# Patient Record
Sex: Female | Born: 1970
Health system: Southern US, Community
[De-identification: ages and names within clinical notes are randomized; demographics above are authoritative.]

## PROBLEM LIST (undated history)

## (undated) DIAGNOSIS — I10 Essential (primary) hypertension: Secondary | ICD-10-CM

## (undated) DIAGNOSIS — F410 Panic disorder [episodic paroxysmal anxiety] without agoraphobia: Secondary | ICD-10-CM

## (undated) DIAGNOSIS — IMO0001 Reserved for inherently not codable concepts without codable children: Secondary | ICD-10-CM

## (undated) DIAGNOSIS — G8929 Other chronic pain: Secondary | ICD-10-CM

## (undated) DIAGNOSIS — R9431 Abnormal electrocardiogram [ECG] [EKG]: Secondary | ICD-10-CM

## (undated) DIAGNOSIS — R002 Palpitations: Secondary | ICD-10-CM

## (undated) DIAGNOSIS — M069 Rheumatoid arthritis, unspecified: Secondary | ICD-10-CM

## (undated) DIAGNOSIS — J45909 Unspecified asthma, uncomplicated: Secondary | ICD-10-CM

## (undated) DIAGNOSIS — M549 Dorsalgia, unspecified: Secondary | ICD-10-CM

## (undated) DIAGNOSIS — I340 Nonrheumatic mitral (valve) insufficiency: Secondary | ICD-10-CM

## (undated) DIAGNOSIS — R0789 Other chest pain: Secondary | ICD-10-CM

## (undated) DIAGNOSIS — D649 Anemia, unspecified: Secondary | ICD-10-CM

## (undated) DIAGNOSIS — K219 Gastro-esophageal reflux disease without esophagitis: Secondary | ICD-10-CM

## (undated) DIAGNOSIS — Z0289 Encounter for other administrative examinations: Secondary | ICD-10-CM

## (undated) DIAGNOSIS — N319 Neuromuscular dysfunction of bladder, unspecified: Secondary | ICD-10-CM

## (undated) DIAGNOSIS — M797 Fibromyalgia: Secondary | ICD-10-CM

## (undated) HISTORY — PX: CARPAL TUNNEL RELEASE: SHX101

## (undated) HISTORY — DX: Palpitations: R00.2

## (undated) HISTORY — DX: Rheumatoid arthritis, unspecified: M06.9

## (undated) HISTORY — DX: Nonrheumatic mitral (valve) insufficiency: I34.0

## (undated) HISTORY — PX: OTHER SURGICAL HISTORY: SHX169

## (undated) HISTORY — DX: Other chest pain: R07.89

## (undated) HISTORY — PX: BACK SURGERY: SHX140

## (undated) HISTORY — DX: Abnormal electrocardiogram (ECG) (EKG): R94.31

## (undated) HISTORY — DX: Anemia, unspecified: D64.9

## (undated) HISTORY — PX: TONSILLECTOMY: SUR1361

---

## 2003-12-13 ENCOUNTER — Emergency Department (HOSPITAL_COMMUNITY): Admission: EM | Admit: 2003-12-13 | Discharge: 2003-12-13 | Payer: Self-pay | Admitting: Emergency Medicine

## 2004-02-01 ENCOUNTER — Emergency Department (HOSPITAL_COMMUNITY): Admission: EM | Admit: 2004-02-01 | Discharge: 2004-02-01 | Payer: Self-pay | Admitting: Emergency Medicine

## 2005-08-19 ENCOUNTER — Encounter: Admission: RE | Admit: 2005-08-19 | Discharge: 2005-09-24 | Payer: Self-pay | Admitting: Orthopedic Surgery

## 2005-10-10 ENCOUNTER — Emergency Department (HOSPITAL_COMMUNITY): Admission: EM | Admit: 2005-10-10 | Discharge: 2005-10-10 | Payer: Self-pay | Admitting: Emergency Medicine

## 2005-10-14 ENCOUNTER — Emergency Department (HOSPITAL_COMMUNITY): Admission: EM | Admit: 2005-10-14 | Discharge: 2005-10-14 | Payer: Self-pay | Admitting: Emergency Medicine

## 2005-10-29 ENCOUNTER — Emergency Department (HOSPITAL_COMMUNITY): Admission: EM | Admit: 2005-10-29 | Discharge: 2005-10-29 | Payer: Self-pay | Admitting: Emergency Medicine

## 2006-04-26 HISTORY — PX: URETERAL STENT PLACEMENT: SHX822

## 2006-06-08 ENCOUNTER — Emergency Department (HOSPITAL_COMMUNITY): Admission: EM | Admit: 2006-06-08 | Discharge: 2006-06-08 | Payer: Self-pay | Admitting: Emergency Medicine

## 2006-12-04 ENCOUNTER — Emergency Department (HOSPITAL_COMMUNITY): Admission: EM | Admit: 2006-12-04 | Discharge: 2006-12-04 | Payer: Self-pay | Admitting: Emergency Medicine

## 2006-12-06 ENCOUNTER — Ambulatory Visit: Payer: Self-pay | Admitting: Internal Medicine

## 2006-12-16 ENCOUNTER — Ambulatory Visit: Payer: Self-pay | Admitting: Internal Medicine

## 2006-12-19 ENCOUNTER — Ambulatory Visit: Payer: Self-pay | Admitting: Internal Medicine

## 2006-12-25 ENCOUNTER — Emergency Department (HOSPITAL_COMMUNITY): Admission: EM | Admit: 2006-12-25 | Discharge: 2006-12-25 | Payer: Self-pay | Admitting: Emergency Medicine

## 2006-12-28 ENCOUNTER — Ambulatory Visit: Payer: Self-pay | Admitting: Internal Medicine

## 2007-01-11 ENCOUNTER — Ambulatory Visit: Payer: Self-pay | Admitting: Pain Medicine

## 2007-01-21 ENCOUNTER — Emergency Department (HOSPITAL_COMMUNITY): Admission: EM | Admit: 2007-01-21 | Discharge: 2007-01-21 | Payer: Self-pay | Admitting: Emergency Medicine

## 2007-02-09 ENCOUNTER — Emergency Department (HOSPITAL_COMMUNITY): Admission: EM | Admit: 2007-02-09 | Discharge: 2007-02-09 | Payer: Self-pay | Admitting: Emergency Medicine

## 2007-02-11 ENCOUNTER — Emergency Department (HOSPITAL_COMMUNITY): Admission: EM | Admit: 2007-02-11 | Discharge: 2007-02-11 | Payer: Self-pay | Admitting: Emergency Medicine

## 2007-02-17 ENCOUNTER — Emergency Department (HOSPITAL_COMMUNITY): Admission: EM | Admit: 2007-02-17 | Discharge: 2007-02-17 | Payer: Self-pay | Admitting: *Deleted

## 2008-03-20 ENCOUNTER — Emergency Department (HOSPITAL_COMMUNITY): Admission: EM | Admit: 2008-03-20 | Discharge: 2008-03-20 | Payer: Self-pay | Admitting: Emergency Medicine

## 2008-12-25 ENCOUNTER — Encounter: Admission: RE | Admit: 2008-12-25 | Discharge: 2008-12-25 | Payer: Self-pay | Admitting: Orthopaedic Surgery

## 2008-12-27 ENCOUNTER — Encounter: Admission: RE | Admit: 2008-12-27 | Discharge: 2008-12-27 | Payer: Self-pay | Admitting: Orthopaedic Surgery

## 2009-05-21 ENCOUNTER — Ambulatory Visit (HOSPITAL_COMMUNITY): Admission: EM | Admit: 2009-05-21 | Discharge: 2009-05-21 | Payer: Self-pay | Admitting: Emergency Medicine

## 2009-06-04 ENCOUNTER — Encounter: Admission: RE | Admit: 2009-06-04 | Discharge: 2009-07-15 | Payer: Self-pay | Admitting: Orthopaedic Surgery

## 2010-05-16 ENCOUNTER — Emergency Department (HOSPITAL_COMMUNITY)
Admission: EM | Admit: 2010-05-16 | Discharge: 2010-05-16 | Payer: Self-pay | Source: Home / Self Care | Admitting: Family Medicine

## 2010-07-19 ENCOUNTER — Emergency Department (HOSPITAL_BASED_OUTPATIENT_CLINIC_OR_DEPARTMENT_OTHER)
Admission: EM | Admit: 2010-07-19 | Discharge: 2010-07-19 | Disposition: A | Discharge status: Home / Self Care | Payer: Medicaid Other | Attending: Emergency Medicine | Admitting: Emergency Medicine

## 2010-07-19 DIAGNOSIS — J45909 Unspecified asthma, uncomplicated: Secondary | ICD-10-CM | POA: Insufficient documentation

## 2010-07-19 DIAGNOSIS — IMO0001 Reserved for inherently not codable concepts without codable children: Secondary | ICD-10-CM | POA: Insufficient documentation

## 2010-07-19 DIAGNOSIS — I10 Essential (primary) hypertension: Secondary | ICD-10-CM | POA: Insufficient documentation

## 2010-07-19 DIAGNOSIS — G8929 Other chronic pain: Secondary | ICD-10-CM | POA: Insufficient documentation

## 2010-07-19 DIAGNOSIS — F319 Bipolar disorder, unspecified: Secondary | ICD-10-CM | POA: Insufficient documentation

## 2010-07-19 DIAGNOSIS — J069 Acute upper respiratory infection, unspecified: Secondary | ICD-10-CM | POA: Insufficient documentation

## 2010-07-19 LAB — URINALYSIS, ROUTINE W REFLEX MICROSCOPIC
Bilirubin Urine: NEGATIVE
Glucose, UA: NEGATIVE mg/dL
Ketones, ur: NEGATIVE mg/dL
Protein, ur: NEGATIVE mg/dL
pH: 7.5 (ref 5.0–8.0)

## 2010-07-31 ENCOUNTER — Other Ambulatory Visit: Payer: Self-pay | Admitting: Internal Medicine

## 2010-07-31 DIAGNOSIS — Z1231 Encounter for screening mammogram for malignant neoplasm of breast: Secondary | ICD-10-CM

## 2010-08-06 ENCOUNTER — Ambulatory Visit
Admission: RE | Admit: 2010-08-06 | Discharge: 2010-08-06 | Disposition: A | Discharge status: Home / Self Care | Payer: Medicaid Other | Source: Ambulatory Visit | Attending: Internal Medicine | Admitting: Internal Medicine

## 2010-08-06 DIAGNOSIS — Z1231 Encounter for screening mammogram for malignant neoplasm of breast: Secondary | ICD-10-CM

## 2010-10-16 ENCOUNTER — Other Ambulatory Visit: Payer: Self-pay | Admitting: Family Medicine

## 2010-10-16 DIAGNOSIS — M25562 Pain in left knee: Secondary | ICD-10-CM

## 2010-11-19 ENCOUNTER — Ambulatory Visit: Payer: Medicaid Other | Attending: Orthopedic Surgery | Admitting: Physical Therapy

## 2010-12-07 ENCOUNTER — Ambulatory Visit: Payer: Medicaid Other | Attending: Orthopedic Surgery | Admitting: Physical Therapy

## 2011-02-03 LAB — POCT PREGNANCY, URINE: Operator id: 26520

## 2011-02-03 LAB — URINALYSIS, ROUTINE W REFLEX MICROSCOPIC
Bilirubin Urine: NEGATIVE
Glucose, UA: NEGATIVE
Hgb urine dipstick: NEGATIVE
Ketones, ur: NEGATIVE
Ketones, ur: NEGATIVE
Nitrite: NEGATIVE
Protein, ur: NEGATIVE
Protein, ur: NEGATIVE
Specific Gravity, Urine: 1.01
Specific Gravity, Urine: 1.017
Urobilinogen, UA: 0.2
Urobilinogen, UA: 0.2
pH: 6

## 2011-02-03 LAB — WET PREP, GENITAL
Clue Cells Wet Prep HPF POC: NONE SEEN
Trich, Wet Prep: NONE SEEN
WBC, Wet Prep HPF POC: NONE SEEN
Yeast Wet Prep HPF POC: NONE SEEN

## 2011-02-03 LAB — PREGNANCY, URINE: Preg Test, Ur: NEGATIVE

## 2011-02-03 LAB — GC/CHLAMYDIA PROBE AMP, GENITAL
Chlamydia, DNA Probe: NEGATIVE
GC Probe Amp, Genital: NEGATIVE

## 2011-02-03 LAB — RAPID STREP SCREEN (MED CTR MEBANE ONLY): Streptococcus, Group A Screen (Direct): NEGATIVE

## 2011-02-04 LAB — URINALYSIS, ROUTINE W REFLEX MICROSCOPIC
Hgb urine dipstick: NEGATIVE
Nitrite: NEGATIVE
Specific Gravity, Urine: 1.023
pH: 6

## 2011-02-05 LAB — BASIC METABOLIC PANEL
CO2: 29
Creatinine, Ser: 1.07
Glucose, Bld: 86

## 2011-02-05 LAB — CBC
MCHC: 34
MCV: 86.9
Platelets: 381
RBC: 4.28
RDW: 14.4 — ABNORMAL HIGH

## 2011-02-05 LAB — DIFFERENTIAL
Eosinophils Absolute: 0.1
Lymphs Abs: 3.1
Monocytes Absolute: 0.6
Monocytes Relative: 5

## 2011-02-05 LAB — URINALYSIS, ROUTINE W REFLEX MICROSCOPIC
Bilirubin Urine: NEGATIVE
Glucose, UA: NEGATIVE
Ketones, ur: NEGATIVE
Specific Gravity, Urine: 1.019
Urobilinogen, UA: 0.2
pH: 5.5

## 2012-03-29 ENCOUNTER — Other Ambulatory Visit (INDEPENDENT_AMBULATORY_CARE_PROVIDER_SITE_OTHER): Payer: Medicaid Other

## 2012-03-29 DIAGNOSIS — N912 Amenorrhea, unspecified: Secondary | ICD-10-CM

## 2012-03-29 DIAGNOSIS — R300 Dysuria: Secondary | ICD-10-CM

## 2012-03-29 LAB — POCT URINALYSIS DIP (DEVICE)
Hgb urine dipstick: NEGATIVE
Leukocytes, UA: NEGATIVE
Nitrite: NEGATIVE

## 2012-03-29 NOTE — Progress Notes (Signed)
Pt requesting pregnancy test due to no period x2 months and also having dysuria.  Pt informed of negative pregnancy test and urinalysis negative for possible infection.  She is not currently using birth control and does not desire pregnancy at this time.  Pt counseled of importance of birth control. Pt advised to increase po fluids, to see her PCP if the dysuria continues and to schedule appt with PCP for birth control.  Pt voiced understanding.

## 2012-04-03 ENCOUNTER — Encounter (HOSPITAL_COMMUNITY): Payer: Self-pay | Admitting: *Deleted

## 2012-04-03 ENCOUNTER — Inpatient Hospital Stay (HOSPITAL_COMMUNITY)
Admission: AD | Admit: 2012-04-03 | Discharge: 2012-04-03 | Disposition: A | Discharge status: Home / Self Care | Payer: Medicaid Other | Source: Ambulatory Visit | Attending: Obstetrics & Gynecology | Admitting: Obstetrics & Gynecology

## 2012-04-03 DIAGNOSIS — L293 Anogenital pruritus, unspecified: Secondary | ICD-10-CM | POA: Insufficient documentation

## 2012-04-03 DIAGNOSIS — M549 Dorsalgia, unspecified: Secondary | ICD-10-CM | POA: Insufficient documentation

## 2012-04-03 DIAGNOSIS — R079 Chest pain, unspecified: Secondary | ICD-10-CM | POA: Insufficient documentation

## 2012-04-03 DIAGNOSIS — K921 Melena: Secondary | ICD-10-CM | POA: Insufficient documentation

## 2012-04-03 LAB — URINALYSIS, ROUTINE W REFLEX MICROSCOPIC
Leukocytes, UA: NEGATIVE
Nitrite: NEGATIVE
Protein, ur: NEGATIVE mg/dL
Specific Gravity, Urine: 1.025 (ref 1.005–1.030)
Urobilinogen, UA: 0.2 mg/dL (ref 0.0–1.0)

## 2012-04-03 LAB — URINE MICROSCOPIC-ADD ON

## 2012-04-03 LAB — POCT PREGNANCY, URINE: Preg Test, Ur: NEGATIVE

## 2012-04-03 LAB — WET PREP, GENITAL
Clue Cells Wet Prep HPF POC: NONE SEEN
Trich, Wet Prep: NONE SEEN
Yeast Wet Prep HPF POC: NONE SEEN

## 2012-04-03 MED ORDER — GI COCKTAIL ~~LOC~~
30.0000 mL | Freq: Once | ORAL | Status: AC
  Administered 2012-04-03: 30 mL via ORAL
  Filled 2012-04-03: qty 30

## 2012-04-03 NOTE — MAU Note (Signed)
Mid back pain radiates to ribs x 3 days, burning with urination, was seen in clinic last week, urine was negative.

## 2012-04-03 NOTE — MAU Provider Note (Signed)
Seen and agree. Suspect chest pain is combination of asthma and Reflux, got better with cocktail. Encouraged to ask family doctor about Gastroenterology referral for rectal bleeding.  Encouraged to ask him about BP meds, as she says her BP is still high, and he manages meds. She states she will call him tomorrow.

## 2012-04-03 NOTE — MAU Note (Signed)
Pt states she also has chest pain, center of chest, worse with breathing - does not radiate.

## 2012-04-03 NOTE — MAU Provider Note (Signed)
History     CSN: [id number]  Arrival date and time: 04/03/12 1040   First Provider Initiated Contact with Patient 04/03/12 1211      Chief Complaint  Patient presents with  . Back Pain  . Dysuria   HPI Felicia Molina is a 41 y.o. female presenting with c/o of back pain and chest pressure for the past 2 days. The back pain is bilateral and located at the CVA and the chest pressure is substernal, non-radiating. Both the back and chest pressure are worse when taking a deep breath. She has been coughing for the past 2 days but denies other URI symptoms. She has a nerve stimulator in place that shocks her when her bladder is full. She has been urinating more frequently in the past 2 days and has post-void trickle. She has vaginal irritation, states it feels like "it is on fire" but denies discharge or bleeding. Denies N/V, fever, abdominal pain. Also states she has a small amount of blood in her stool and on the TP after wiping for the past month.      OB History    Grav Para Term Preterm Abortions TAB SAB Ect Mult Living   6 4 4  0 2 0 2 0 0 4      No past medical history on file.  No past surgical history on file.  No family history on file.  History  Substance Use Topics  . Smoking status: Not on file  . Smokeless tobacco: Not on file  . Alcohol Use: Not on file    Allergies: No Known Allergies  No prescriptions prior to admission    Review of Systems  Constitutional: Negative for fever, chills and weight loss.  Respiratory: Positive for cough and shortness of breath. Negative for sputum production.   Cardiovascular: Positive for chest pain and orthopnea.  Gastrointestinal: Positive for blood in stool. Negative for nausea, vomiting, abdominal pain, diarrhea and constipation.  Genitourinary: Positive for frequency and flank pain. Negative for dysuria, urgency and hematuria.  Musculoskeletal: Positive for back pain.  Neurological: Negative for dizziness, tingling, sensory  change, focal weakness and weakness.    Physical Exam   Blood pressure 130/90, pulse 68, temperature 97.6 F (36.4 C), temperature source Oral, resp. rate 20, height 5' (1.524 m), weight 134.174 kg (295 lb 12.8 oz), last menstrual period 02/05/2012, SpO2 100.00%.  Physical Exam  Constitutional: She is oriented to person, place, and time. She appears well-developed and well-nourished. No distress.  Cardiovascular: Normal rate, regular rhythm and normal heart sounds.   Respiratory: Effort normal and breath sounds normal. She has no wheezes. She has no rales. She exhibits tenderness (mid-sternal).  GI: Soft. Bowel sounds are normal. She exhibits no mass. There is no tenderness. There is no rebound, no guarding and no CVA tenderness.  Genitourinary: Uterus is not enlarged and not tender. Cervix exhibits no motion tenderness. Right adnexum displays no tenderness and no fullness. Left adnexum displays no tenderness and no fullness. No tenderness around the vagina. Vaginal discharge (white, thick) found.  Neurological: She is alert and oriented to person, place, and time. No cranial nerve deficit. Coordination normal.  Skin: Skin is warm and dry. She is not diaphoretic.  Psychiatric: She has a normal mood and affect. Her behavior is normal. Judgment and thought content normal.    MAU Course  Procedures  Results for orders placed during the hospital encounter of 04/03/12 (from the past 24 hour(s))  URINALYSIS, ROUTINE W REFLEX MICROSCOPIC  Status: Abnormal   Collection Time   04/03/12 11:35 AM      Component Value Range   Color, Urine YELLOW  YELLOW   APPearance CLEAR  CLEAR   Specific Gravity, Urine 1.025  1.005 - 1.030   pH 6.0  5.0 - 8.0   Glucose, UA NEGATIVE  NEGATIVE mg/dL   Hgb urine dipstick TRACE (*) NEGATIVE   Bilirubin Urine NEGATIVE  NEGATIVE   Ketones, ur NEGATIVE  NEGATIVE mg/dL   Protein, ur NEGATIVE  NEGATIVE mg/dL   Urobilinogen, UA 0.2  0.0 - 1.0 mg/dL   Nitrite  NEGATIVE  NEGATIVE   Leukocytes, UA NEGATIVE  NEGATIVE  URINE MICROSCOPIC-ADD ON     Status: Abnormal   Collection Time   04/03/12 11:35 AM      Component Value Range   Squamous Epithelial / LPF FEW (*) RARE   WBC, UA 0-2  <3 WBC/hpf   RBC / HPF 0-2  <3 RBC/hpf   Bacteria, UA FEW (*) RARE  POCT PREGNANCY, URINE     Status: Normal   Collection Time   04/03/12 12:11 PM      Component Value Range   Preg Test, Ur NEGATIVE  NEGATIVE   *GC/CT results pending*  Assessment and Plan  A: 41 y.o. female with flank pain, chest pain, vaginal irritation, and blood in stool     Chest pain improved with GI cocktail     Flank pain and frequency possibly due to nephrolithiasis     Pt declined further assessment of chest pain or back because she needed to leave, requested we call her with the results of the wet prep  P: Advised pt to get U/S to assess for kidney stones, instructed to f/u with urologist     Instructed to f/u with PCP about blood in stool  Corky Downs 04/03/2012, 12:25 PM

## 2012-04-28 ENCOUNTER — Emergency Department (HOSPITAL_BASED_OUTPATIENT_CLINIC_OR_DEPARTMENT_OTHER)
Admission: EM | Admit: 2012-04-28 | Discharge: 2012-04-28 | Disposition: A | Discharge status: Home / Self Care | Payer: Medicaid Other | Attending: Emergency Medicine | Admitting: Emergency Medicine

## 2012-04-28 ENCOUNTER — Encounter (HOSPITAL_BASED_OUTPATIENT_CLINIC_OR_DEPARTMENT_OTHER): Payer: Self-pay | Admitting: *Deleted

## 2012-04-28 DIAGNOSIS — L0390 Cellulitis, unspecified: Secondary | ICD-10-CM

## 2012-04-28 DIAGNOSIS — I10 Essential (primary) hypertension: Secondary | ICD-10-CM | POA: Insufficient documentation

## 2012-04-28 DIAGNOSIS — Z79899 Other long term (current) drug therapy: Secondary | ICD-10-CM | POA: Insufficient documentation

## 2012-04-28 DIAGNOSIS — J45909 Unspecified asthma, uncomplicated: Secondary | ICD-10-CM | POA: Insufficient documentation

## 2012-04-28 DIAGNOSIS — Y929 Unspecified place or not applicable: Secondary | ICD-10-CM | POA: Insufficient documentation

## 2012-04-28 DIAGNOSIS — Y9389 Activity, other specified: Secondary | ICD-10-CM | POA: Insufficient documentation

## 2012-04-28 DIAGNOSIS — Z8739 Personal history of other diseases of the musculoskeletal system and connective tissue: Secondary | ICD-10-CM | POA: Insufficient documentation

## 2012-04-28 DIAGNOSIS — IMO0002 Reserved for concepts with insufficient information to code with codable children: Secondary | ICD-10-CM | POA: Insufficient documentation

## 2012-04-28 HISTORY — DX: Unspecified asthma, uncomplicated: J45.909

## 2012-04-28 HISTORY — DX: Fibromyalgia: M79.7

## 2012-04-28 HISTORY — DX: Essential (primary) hypertension: I10

## 2012-04-28 MED ORDER — SULFAMETHOXAZOLE-TRIMETHOPRIM 800-160 MG PO TABS: 1.0000 | ORAL_TABLET | Freq: Two times a day (BID) | ORAL | Status: AC

## 2012-04-28 MED ORDER — LIDOCAINE-EPINEPHRINE 2 %-1:100000 IJ SOLN
20.0000 mL | Freq: Once | INTRAMUSCULAR | Status: AC
  Administered 2012-04-28: 20 mL

## 2012-04-28 MED ORDER — LIDOCAINE-EPINEPHRINE 2 %-1:100000 IJ SOLN
INTRAMUSCULAR | Status: AC
  Administered 2012-04-28: 20 mL
  Filled 2012-04-28: qty 1

## 2012-04-28 NOTE — ED Provider Notes (Signed)
History     CSN: [id number]  Arrival date & time 04/28/12  1706   First MD Initiated Contact with Patient 04/28/12 1831      Chief Complaint  Patient presents with  . Insect Bite    (Consider location/radiation/quality/duration/timing/severity/associated sxs/prior treatment) HPI Complains of swollen painful area on right forearm noted yesterday. Patient thinks she was bitten by an insect. She squeezed the area and reports white pus coming out no treatment prior to coming here no fever no other associated symptoms. No other treatment prior to coming here. Past Medical History  Diagnosis Date  . Asthma   . Hypertension   . Fibromyalgia     Past Surgical History  Procedure Date  . Back surgery   . Carpal tunnel release     History reviewed. No pertinent family history.  History  Substance Use Topics  . Smoking status: Not on file  . Smokeless tobacco: Not on file  . Alcohol Use:     OB History    Grav Para Term Preterm Abortions TAB SAB Ect Mult Living   6 4 4  0 2 0 2 0 0 4      Review of Systems  Constitutional: Negative.   Respiratory: Negative.   Cardiovascular: Negative.   Gastrointestinal: Negative.   Skin: Positive for wound.       Wound on right forearm    Allergies  Review of patient's allergies indicates no known allergies.  Home Medications   Current Outpatient Rx  Name  Route  Sig  Dispense  Refill  . ALBUTEROL SULFATE HFA 108 (90 BASE) MCG/ACT IN AERS   Inhalation   Inhale 2 puffs into the lungs every 6 (six) hours as needed. Takes for shortness of breath         . ALPRAZOLAM 0.5 MG PO TABS   Oral   Take 0.5 mg by mouth at bedtime as needed. Takes for depression         . AMOXICILLIN PO   Oral   Take 1 tablet by mouth daily.         . BUDESONIDE-FORMOTEROL FUMARATE 160-4.5 MCG/ACT IN AERO   Inhalation   Inhale 2 puffs into the lungs 2 (two) times daily.         . CYCLOBENZAPRINE HCL 10 MG PO TABS   Oral   Take 10 mg by  mouth 3 (three) times daily as needed. Takes for muscle spasms         . DICLOFENAC SODIUM 1 % TD GEL   Topical   Apply 2 g topically 4 (four) times daily.         Marland Kitchen LOSARTAN POTASSIUM 25 MG PO TABS   Oral   Take 25 mg by mouth daily.         Marland Kitchen MONTELUKAST SODIUM 10 MG PO TABS   Oral   Take 10 mg by mouth at bedtime.         . OMEPRAZOLE PO   Oral   Take 1 tablet by mouth daily.         . OXYCODONE-ACETAMINOPHEN 10-325 MG PO TABS   Oral   Take 1 tablet by mouth every 4 (four) hours as needed. Takes for pain         . OXYMORPHONE HCL ER 20 MG PO TB12   Oral   Take 20 mg by mouth every 12 (twelve) hours.         Marland Kitchen PREGABALIN 75 MG PO CAPS  Oral   Take 75 mg by mouth 3 (three) times daily.           BP 151/92  Pulse 100  Temp 98.6 F (37 C) (Oral)  Resp 16  Ht 5' (1.524 m)  Wt 300 lb (136.079 kg)  BMI 58.59 kg/m2  SpO2 100%  LMP 02/05/2012  Physical Exam  Nursing note and vitals reviewed. Constitutional: She is oriented to person, place, and time. She appears well-developed and well-nourished.  HENT:  Head: Normocephalic and atraumatic.  Eyes: Conjunctivae normal are normal. Pupils are equal, round, and reactive to light.  Neck: Neck supple. No tracheal deviation present. No thyromegaly present.  Cardiovascular: Normal rate and regular rhythm.   No murmur heard. Pulmonary/Chest: Effort normal and breath sounds normal.  Abdominal: Soft. Bowel sounds are normal. She exhibits no distension. There is no tenderness.       Morbidly obese  Musculoskeletal: Normal range of motion. She exhibits no edema and no tenderness.  Neurological: She is alert and oriented to person, place, and time. Coordination normal.  Skin: Skin is warm and dry. No rash noted.       +3 cm tender swollen area at right proximal forearm no red streaks up the arm  Psychiatric: She has a normal mood and affect.    ED Course  Procedures (including critical care time)  Labs  Reviewed - No data to display No results found.   No diagnosis found.  Swollen painful Area on right forearm was incised  by Ms.Harris, PA-C No gross pus came from the area  MDM    Plan prescriptions written for antibiotics Diagnosis cellulitis of right forearm      Doug Sou, MD 04/28/12 2352

## 2012-04-28 NOTE — ED Notes (Signed)
Pt c/o spider bite  To right upper arm x 1 day

## 2012-04-28 NOTE — ED Provider Notes (Signed)
INCISION AND DRAINAGE Performed by: Arthor Captain Consent: Verbal consent obtained. Risks and benefits: risks, benefits and alternatives were discussed Type: abscess  Body area: right 4 arm  Anesthesia: local infiltration  Incision was made with a scalpel.  Local anesthetic: lidocaine 2% with epinephrine  Anesthetic total: 2 ml  Complexity: complex Blunt dissection to break up loculations  Drainage: purulent  Drainage amount: none Flushed with sterile saline and lidocaine  Patient tolerance: Patient tolerated the procedure well with no immediate complications.     Arthor Captain, PA-C 04/28/12 1952

## 2012-04-28 NOTE — ED Notes (Signed)
Pt's wound cleaned, covered with gauze and wrapped with kling.

## 2012-04-28 NOTE — ED Provider Notes (Signed)
Medical screening examination/treatment/procedure(s) were conducted as a shared visit with non-physician practitioner(s) and myself.  I personally evaluated the patient during the encounter  Doug Sou, MD 04/28/12 2353

## 2012-05-01 ENCOUNTER — Emergency Department (HOSPITAL_BASED_OUTPATIENT_CLINIC_OR_DEPARTMENT_OTHER)
Admission: EM | Admit: 2012-05-01 | Discharge: 2012-05-01 | Disposition: A | Discharge status: Home / Self Care | Payer: Medicaid Other | Attending: Emergency Medicine | Admitting: Emergency Medicine

## 2012-05-01 ENCOUNTER — Encounter (HOSPITAL_BASED_OUTPATIENT_CLINIC_OR_DEPARTMENT_OTHER): Payer: Self-pay | Admitting: *Deleted

## 2012-05-01 DIAGNOSIS — Z79899 Other long term (current) drug therapy: Secondary | ICD-10-CM | POA: Insufficient documentation

## 2012-05-01 DIAGNOSIS — I10 Essential (primary) hypertension: Secondary | ICD-10-CM | POA: Insufficient documentation

## 2012-05-01 DIAGNOSIS — L0291 Cutaneous abscess, unspecified: Secondary | ICD-10-CM

## 2012-05-01 DIAGNOSIS — IMO0002 Reserved for concepts with insufficient information to code with codable children: Secondary | ICD-10-CM | POA: Insufficient documentation

## 2012-05-01 DIAGNOSIS — J45909 Unspecified asthma, uncomplicated: Secondary | ICD-10-CM | POA: Insufficient documentation

## 2012-05-01 DIAGNOSIS — Z8739 Personal history of other diseases of the musculoskeletal system and connective tissue: Secondary | ICD-10-CM | POA: Insufficient documentation

## 2012-05-01 NOTE — ED Notes (Signed)
Patient here for a wound recheck.  Patient has a right anterior forearm open wound that is swollen and tender to touch.  No drainage is noted.

## 2012-05-01 NOTE — ED Provider Notes (Signed)
History     CSN: [id number]  Arrival date & time 05/01/12  1303   First MD Initiated Contact with Patient 05/01/12 1350      Chief Complaint  Patient presents with  . Wound Check    (Consider location/radiation/quality/duration/timing/severity/associated sxs/prior treatment) Patient is a 42 y.o. female presenting with wound check. The history is provided by the patient.  Wound Check  She was treated in the ED 2 to 3 days ago. Previous treatment in the ED includes I&D of abscess. Treatments since wound repair include a wound recheck. There has been no drainage from the wound. There is no redness present. There is no swelling present. The pain has improved. She has no difficulty moving the affected extremity or digit.    Past Medical History  Diagnosis Date  . Asthma   . Hypertension   . Fibromyalgia     Past Surgical History  Procedure Date  . Back surgery   . Carpal tunnel release     No family history on file.  History  Substance Use Topics  . Smoking status: Not on file  . Smokeless tobacco: Not on file  . Alcohol Use:     OB History    Grav Para Term Preterm Abortions TAB SAB Ect Mult Living   6 4 4  0 2 0 2 0 0 4      Review of Systems  Constitutional: Negative for fever.  All other systems reviewed and are negative.    Allergies  Review of patient's allergies indicates no known allergies.  Home Medications   Current Outpatient Rx  Name  Route  Sig  Dispense  Refill  . ALBUTEROL SULFATE HFA 108 (90 BASE) MCG/ACT IN AERS   Inhalation   Inhale 2 puffs into the lungs every 6 (six) hours as needed. Takes for shortness of breath         . ALPRAZOLAM 0.5 MG PO TABS   Oral   Take 0.5 mg by mouth at bedtime as needed. Takes for depression         . AMOXICILLIN PO   Oral   Take 1 tablet by mouth daily.         . BUDESONIDE-FORMOTEROL FUMARATE 160-4.5 MCG/ACT IN AERO   Inhalation   Inhale 2 puffs into the lungs 2 (two) times daily.           . CYCLOBENZAPRINE HCL 10 MG PO TABS   Oral   Take 10 mg by mouth 3 (three) times daily as needed. Takes for muscle spasms         . DICLOFENAC SODIUM 1 % TD GEL   Topical   Apply 2 g topically 4 (four) times daily.         Marland Kitchen LOSARTAN POTASSIUM 25 MG PO TABS   Oral   Take 25 mg by mouth daily.         Marland Kitchen MONTELUKAST SODIUM 10 MG PO TABS   Oral   Take 10 mg by mouth at bedtime.         . OMEPRAZOLE PO   Oral   Take 1 tablet by mouth daily.         . OXYCODONE-ACETAMINOPHEN 10-325 MG PO TABS   Oral   Take 1 tablet by mouth every 4 (four) hours as needed. Takes for pain         . OXYMORPHONE HCL ER 20 MG PO TB12   Oral   Take 20 mg by mouth every  12 (twelve) hours.         Marland Kitchen PREGABALIN 75 MG PO CAPS   Oral   Take 75 mg by mouth 3 (three) times daily.         . SULFAMETHOXAZOLE-TRIMETHOPRIM 800-160 MG PO TABS   Oral   Take 1 tablet by mouth 2 (two) times daily.   14 tablet   0     BP 155/89  Pulse 111  Temp 99.1 F (37.3 C) (Oral)  Resp 20  SpO2 99%  LMP 02/05/2012  Physical Exam  Nursing note and vitals reviewed. Constitutional: She is oriented to person, place, and time. She appears well-developed and well-nourished. No distress.  HENT:  Head: Normocephalic and atraumatic.  Right Ear: Tympanic membrane and ear canal normal.  Left Ear: Tympanic membrane and ear canal normal.  Mouth/Throat: Oropharynx is clear and moist.  Eyes: Conjunctivae normal and EOM are normal. Pupils are equal, round, and reactive to light.  Neck: Normal range of motion. Neck supple.  Cardiovascular: Normal rate, regular rhythm and intact distal pulses.   No murmur heard. Pulmonary/Chest: Effort normal and breath sounds normal. No respiratory distress. She has no wheezes. She has no rales.  Abdominal: Soft. She exhibits no distension. There is no tenderness. There is no rebound and no guarding.  Musculoskeletal: Normal range of motion. She exhibits no edema and no  tenderness.  Neurological: She is alert and oriented to person, place, and time.  Skin: Skin is warm and dry. No rash noted. No erythema.     Psychiatric: She has a normal mood and affect. Her behavior is normal.    ED Course  Procedures (including critical care time)  Labs Reviewed - No data to display No results found.   1. Abscess       MDM   Patient here for a wound recheck. Abscess was lanced 2 days ago and states still having pain over the arm but feeling much better.  No signs of persistent infection. Patient will continue dressings and return with worsening symptoms.        Gwyneth Sprout, MD 05/01/12 1421

## 2012-05-01 NOTE — ED Notes (Signed)
States she is here to have a recheck of her right arm. Abscess. States she is having pain from the site.

## 2012-05-04 ENCOUNTER — Ambulatory Visit: Payer: Medicaid Other | Admitting: Medical

## 2012-05-05 ENCOUNTER — Emergency Department (INDEPENDENT_AMBULATORY_CARE_PROVIDER_SITE_OTHER): Payer: Medicaid Other

## 2012-05-05 ENCOUNTER — Inpatient Hospital Stay (HOSPITAL_COMMUNITY)
Admission: EM | Admit: 2012-05-05 | Discharge: 2012-05-09 | DRG: 193 | Disposition: A | Discharge status: Home / Self Care | Payer: Medicaid Other | Attending: Internal Medicine | Admitting: Internal Medicine

## 2012-05-05 ENCOUNTER — Encounter (HOSPITAL_COMMUNITY): Payer: Self-pay | Admitting: Emergency Medicine

## 2012-05-05 ENCOUNTER — Emergency Department (HOSPITAL_COMMUNITY)
Admission: EM | Admit: 2012-05-05 | Discharge: 2012-05-05 | Disposition: A | Discharge status: Nursing Home (Includes ICF) | Payer: Medicaid Other | Source: Home / Self Care | Attending: Emergency Medicine | Admitting: Emergency Medicine

## 2012-05-05 DIAGNOSIS — R042 Hemoptysis: Secondary | ICD-10-CM

## 2012-05-05 DIAGNOSIS — IMO0001 Reserved for inherently not codable concepts without codable children: Secondary | ICD-10-CM | POA: Diagnosis present

## 2012-05-05 DIAGNOSIS — J189 Pneumonia, unspecified organism: Secondary | ICD-10-CM

## 2012-05-05 DIAGNOSIS — J45901 Unspecified asthma with (acute) exacerbation: Secondary | ICD-10-CM

## 2012-05-05 DIAGNOSIS — Z23 Encounter for immunization: Secondary | ICD-10-CM

## 2012-05-05 DIAGNOSIS — J45909 Unspecified asthma, uncomplicated: Secondary | ICD-10-CM

## 2012-05-05 DIAGNOSIS — I10 Essential (primary) hypertension: Secondary | ICD-10-CM | POA: Diagnosis present

## 2012-05-05 DIAGNOSIS — E8779 Other fluid overload: Secondary | ICD-10-CM | POA: Diagnosis present

## 2012-05-05 DIAGNOSIS — J069 Acute upper respiratory infection, unspecified: Secondary | ICD-10-CM | POA: Diagnosis present

## 2012-05-05 DIAGNOSIS — Z79899 Other long term (current) drug therapy: Secondary | ICD-10-CM

## 2012-05-05 DIAGNOSIS — R0902 Hypoxemia: Secondary | ICD-10-CM

## 2012-05-05 DIAGNOSIS — J111 Influenza due to unidentified influenza virus with other respiratory manifestations: Secondary | ICD-10-CM | POA: Diagnosis present

## 2012-05-05 DIAGNOSIS — Z886 Allergy status to analgesic agent status: Secondary | ICD-10-CM

## 2012-05-05 DIAGNOSIS — R05 Cough: Secondary | ICD-10-CM

## 2012-05-05 DIAGNOSIS — R058 Other specified cough: Secondary | ICD-10-CM

## 2012-05-05 DIAGNOSIS — J9600 Acute respiratory failure, unspecified whether with hypoxia or hypercapnia: Secondary | ICD-10-CM | POA: Diagnosis present

## 2012-05-05 LAB — CBC WITH DIFFERENTIAL/PLATELET
Basophils Absolute: 0.1 10*3/uL (ref 0.0–0.1)
Basophils Relative: 1 % (ref 0–1)
Eosinophils Absolute: 0 K/uL (ref 0.0–0.7)
Eosinophils Relative: 0 % (ref 0–5)
HCT: 39.9 % (ref 36.0–46.0)
Hemoglobin: 13.4 g/dL (ref 12.0–15.0)
Lymphocytes Relative: 18 % (ref 12–46)
Lymphs Abs: 1.8 10*3/uL (ref 0.7–4.0)
MCH: 28.9 pg (ref 26.0–34.0)
MCHC: 33.6 g/dL (ref 30.0–36.0)
MCV: 86.2 fL (ref 78.0–100.0)
Monocytes Absolute: 0.3 10*3/uL (ref 0.1–1.0)
Monocytes Relative: 3 % (ref 3–12)
Neutro Abs: 8 K/uL — ABNORMAL HIGH (ref 1.7–7.7)
Neutrophils Relative %: 78 % — ABNORMAL HIGH (ref 43–77)
Platelets: 374 10*3/uL (ref 150–400)
RBC: 4.63 MIL/uL (ref 3.87–5.11)
RDW: 14.1 % (ref 11.5–15.5)
WBC: 10.2 10*3/uL (ref 4.0–10.5)

## 2012-05-05 LAB — COMPREHENSIVE METABOLIC PANEL WITH GFR
ALT: 33 U/L (ref 0–35)
Alkaline Phosphatase: 76 U/L (ref 39–117)
CO2: 24 meq/L (ref 19–32)
Calcium: 9.9 mg/dL (ref 8.4–10.5)
Chloride: 99 meq/L (ref 96–112)
GFR calc Af Amer: 80 mL/min — ABNORMAL LOW (ref >90)
GFR calc non Af Amer: 69 mL/min — ABNORMAL LOW (ref >90)
Glucose, Bld: 110 mg/dL — ABNORMAL HIGH (ref 70–99)
Sodium: 137 meq/L (ref 135–145)
Total Bilirubin: 0.5 mg/dL (ref 0.3–1.2)

## 2012-05-05 LAB — URINE MICROSCOPIC-ADD ON

## 2012-05-05 LAB — COMPREHENSIVE METABOLIC PANEL
AST: 36 U/L (ref 0–37)
Albumin: 3.8 g/dL (ref 3.5–5.2)
BUN: 11 mg/dL (ref 6–23)
Creatinine, Ser: 1 mg/dL (ref 0.50–1.10)
Potassium: 5.1 mEq/L (ref 3.5–5.1)
Total Protein: 8.9 g/dL — ABNORMAL HIGH (ref 6.0–8.3)

## 2012-05-05 LAB — URINALYSIS, ROUTINE W REFLEX MICROSCOPIC
Glucose, UA: NEGATIVE mg/dL
Ketones, ur: 15 mg/dL — AB
Leukocytes, UA: NEGATIVE
Nitrite: NEGATIVE
Protein, ur: >300 mg/dL — AB
Specific Gravity, Urine: 1.034 — ABNORMAL HIGH (ref 1.005–1.030)
Urobilinogen, UA: 1 mg/dL (ref 0.0–1.0)
pH: 5.5 (ref 5.0–8.0)

## 2012-05-05 LAB — LACTIC ACID, PLASMA: Lactic Acid, Venous: 2 mmol/L (ref 0.5–2.2)

## 2012-05-05 MED ORDER — METHYLPREDNISOLONE SODIUM SUCC 125 MG IJ SOLR
125.0000 mg | Freq: Once | INTRAMUSCULAR | Status: AC
  Administered 2012-05-05: 125 mg via INTRAMUSCULAR

## 2012-05-05 MED ORDER — ACETAMINOPHEN 500 MG PO TABS
1000.0000 mg | ORAL_TABLET | Freq: Once | ORAL | Status: AC
  Administered 2012-05-05: 1000 mg via ORAL
  Filled 2012-05-05: qty 2

## 2012-05-05 MED ORDER — SODIUM CHLORIDE 0.9 % IV SOLN
1000.0000 mL | Freq: Once | INTRAVENOUS | Status: AC
  Administered 2012-05-05: 1000 mL via INTRAVENOUS

## 2012-05-05 MED ORDER — SODIUM CHLORIDE 0.9 % IV SOLN
1000.0000 mL | INTRAVENOUS | Status: DC
  Administered 2012-05-05: 1000 mL via INTRAVENOUS

## 2012-05-05 MED ORDER — ALBUTEROL SULFATE (5 MG/ML) 0.5% IN NEBU
5.0000 mg | INHALATION_SOLUTION | Freq: Once | RESPIRATORY_TRACT | Status: AC
  Administered 2012-05-05: 5 mg via RESPIRATORY_TRACT

## 2012-05-05 MED ORDER — DEXTROSE 5 % IV SOLN
500.0000 mg | Freq: Once | INTRAVENOUS | Status: AC
  Administered 2012-05-05: 500 mg via INTRAVENOUS
  Filled 2012-05-05: qty 500

## 2012-05-05 MED ORDER — ALBUTEROL SULFATE (5 MG/ML) 0.5% IN NEBU
INHALATION_SOLUTION | RESPIRATORY_TRACT | Status: AC
  Filled 2012-05-05: qty 1

## 2012-05-05 MED ORDER — IPRATROPIUM BROMIDE 0.02 % IN SOLN
0.5000 mg | Freq: Once | RESPIRATORY_TRACT | Status: AC
  Administered 2012-05-05: 0.5 mg via RESPIRATORY_TRACT

## 2012-05-05 MED ORDER — DEXTROSE 5 % IV SOLN
1.0000 g | Freq: Once | INTRAVENOUS | Status: AC
  Administered 2012-05-05: 1 g via INTRAVENOUS
  Filled 2012-05-05: qty 10

## 2012-05-05 MED ORDER — ALBUTEROL SULFATE (5 MG/ML) 0.5% IN NEBU
20.0000 mg | INHALATION_SOLUTION | Freq: Once | RESPIRATORY_TRACT | Status: AC
  Administered 2012-05-05: 20 mg via RESPIRATORY_TRACT
  Filled 2012-05-05 (×2): qty 4

## 2012-05-05 MED ORDER — IPRATROPIUM BROMIDE 0.02 % IN SOLN
0.5000 mg | RESPIRATORY_TRACT | Status: AC
  Administered 2012-05-05: 0.5 mg via RESPIRATORY_TRACT
  Filled 2012-05-05: qty 2.5

## 2012-05-05 MED ORDER — METHYLPREDNISOLONE SODIUM SUCC 125 MG IJ SOLR
INTRAMUSCULAR | Status: AC
  Filled 2012-05-05: qty 2

## 2012-05-05 NOTE — ED Notes (Signed)
Pt refused blood cultures, requesting IV team.  IV team will come to start second line and attempt to get blood cultures.

## 2012-05-05 NOTE — ED Notes (Signed)
Respiratory at bedside.

## 2012-05-05 NOTE — ED Provider Notes (Signed)
Chief Complaint  Patient presents with  . URI    productive cough with green sputum and blood x 3 days. diarrhea. vomiting. chest tightness sob. fever. chills    History of Present Illness:   The patient is a 42 year old female with asthma who presents with a three-day history of shortness of breath, difficulty breathing, cough productive green, bloody sputum, and wheezing. She's had aching in her ribs, nausea, vomiting and diarrhea and also chills and fever. She's had some nasal congestion and rhinorrhea. She has a long-standing history of asthma and is on Singulair and albuterol nebulizer which so far has not been helping at home. She's also on Bactrim because of an infection on her right arm. She takes multiple other meds including Symbicort, Flexeril, Cozaar, Lyrica, Xanax, Percocet, and oxymorphone. She has hypertension and chronic lower back pain.  Review of Systems:  Other than noted above, the patient denies any of the following symptoms. Systemic:  No fever, chills, sweats, fatigue, myalgias, headache, or anorexia. Eye:  No redness, pain or drainage. ENT:  No earache, ear congestion, nasal congestion, sneezing, rhinorrhea, sinus pressure, sinus pain, post nasal drip, or sore throat. Lungs:  No cough, sputum production, wheezing, shortness of breath, or chest pain. GI:  No abdominal pain, nausea, vomiting, or diarrhea.  PMFSH:  Past medical history, family history, social history, meds, and allergies were reviewed.  Physical Exam:   Vital signs:  BP 138/100  Pulse 108  Temp 99.5 F (37.5 C) (Oral)  Resp 24  SpO2 92%  LMP 02/05/2012 General:  Alert, in no distress. Eye:  No conjunctival injection or drainage. Lids were normal. ENT:  TMs and canals were normal, without erythema or inflammation.  Nasal mucosa was clear and uncongested, without drainage.  Mucous membranes were moist.  Pharynx was clear, without exudate or drainage.  There were no oral ulcerations or lesions. Neck:   Supple, no adenopathy, tenderness or mass. Lungs:  She has audible wheezes and labored breathing but no use of accessory muscles or retractions. On auscultation she has bilateral expiratory wheezes both anteriorly and posteriorly and rales at both bases anteriorly.  Heart:  Regular rhythm, without gallops, murmers or rubs. Skin:  Clear, warm, and dry, without rash or lesions.  Radiology:  Dg Chest 2 View  05/05/2012  *RADIOLOGY REPORT*  Clinical Data: Cough for 3 days  CHEST - 2 VIEW  Comparison: 02/11/2007  Findings: Normal lung volumes.  No effusions or edema.  Normal heart size.  Hazy lung opacities are identified in both lung bases. The review of the visualized osseous structures is unremarkable.  IMPRESSION:  1.  Hazy bilateral lung opacities within the lower lobes may represent infiltrates and/or atelectasis.   Original Report Authenticated By: Signa Kell, M.D.    I reviewed the images independently and personally and concur with the radiologist's findings.  Course in Urgent Care Center:   She was given a DuoNeb breathing treatment and Solu-Medrol 125 mg IM. Thereafter she felt no better, was still short of breath, and on auscultation she sounded the same with expiratory wheezes and rales.  Assessment:  The primary encounter diagnosis was Community acquired pneumonia. Diagnoses of Hemoptysis, Asthma, and Hypoxemia were also pertinent to this visit.  She has a number of factors including bilateral pneumonia, asthma which has not been responding to nebulizer treatments, hypoxemia, obesity, hemoptysis, and nausea and vomiting. Because of this I feel she is a high-risk patient and wants further evaluation in the hospital.  Plan:  1.  The following meds were prescribed:   New Prescriptions   No medications on file   2.  The patient was transferred to the emergency department via shuttle.  Reuben Likes, MD 05/05/12 [phone]

## 2012-05-05 NOTE — ED Notes (Signed)
Pt st's she started getting sick 3 days ago with productive cough, diff. Breathing, nausea and vomiting.  Pt is currently taking Bactrim for spider bite.  Pt was seen and dx with Pneumonia at Urgent Care and sent to ED for further treatment.

## 2012-05-05 NOTE — ED Notes (Signed)
Patient receiving breathing treatment.

## 2012-05-05 NOTE — ED Notes (Signed)
Pt c/o productive cough with green sputum and blood. Fever. Chills. Vomiting. Diarrhea. Chest tightness and shortness of breath. Pt has a hx of asthma. Pt has tried otc meds, nebulizer and inhalers with no relief of symptoms. Symptoms x 3 days.

## 2012-05-05 NOTE — ED Notes (Signed)
Pt states that she has been SOB, diaphoretic, fevers, and weak x 3 days.  States she went to urgent care and they diagnosed her with pneumonia but sent her here for further evaluation.  Pt placed on 3 L [place] for SOB.

## 2012-05-05 NOTE — ED Notes (Signed)
Spoke with resident, March Rummage, regarding lack of 2nd IV and blood cultures not being drawn.  Pt refuses to be stuck except by IV team.  Spoke with IV team and states it will take longer.  Ok to start abx now per Dr. March Rummage

## 2012-05-05 NOTE — ED Notes (Signed)
Mask placed on pt in triage

## 2012-05-05 NOTE — ED Provider Notes (Signed)
History     CSN: [id number]  Arrival date & time 05/05/12  1815   First MD Initiated Contact with Patient 05/05/12 1936      Chief Complaint  Patient presents with  . URI    (Consider location/radiation/quality/duration/timing/severity/associated sxs/prior treatment) HPI chief complaint: Cough. Onset: 3 days ago. Location: Home. Not improved or worsened by anything. Severity: Motor. Timing: Intermittent. Context: Patient sent from urgent care for pneumonia and asthma. For signs and symptoms to review of systems. Regarding social history see nurse's notes. I have reviewed patient's past medical, past surgical, social history as well as medications and allergies.  Past Medical History  Diagnosis Date  . Asthma   . Hypertension   . Fibromyalgia     Past Surgical History  Procedure Date  . Back surgery   . Carpal tunnel release     No family history on file.  History  Substance Use Topics  . Smoking status: Never Smoker   . Smokeless tobacco: Not on file  . Alcohol Use: No    OB History    Grav Para Term Preterm Abortions TAB SAB Ect Mult Living   6 4 4  0 2 0 2 0 0 4      Review of Systems  Constitutional: Positive for diaphoresis. Negative for fever and chills.  HENT: Negative for trouble swallowing, neck pain and neck stiffness.   Eyes: Negative for pain, discharge and itching.  Respiratory: Positive for cough, shortness of breath and wheezing. Negative for chest tightness.        Denies hemoptysis.  Cardiovascular: Negative for chest pain, palpitations and leg swelling.  Gastrointestinal: Negative for nausea, vomiting, abdominal pain, diarrhea, constipation and blood in stool.  Genitourinary: Negative for dysuria, urgency, frequency, hematuria, flank pain, decreased urine volume, difficulty urinating and pelvic pain.  Musculoskeletal: Negative for back pain and joint swelling.  Skin: Negative for rash and wound.  Neurological: Negative for dizziness, tremors,  seizures, syncope, facial asymmetry, speech difficulty, weakness, light-headedness, numbness and headaches.  Hematological: Negative for adenopathy. Does not bruise/bleed easily.  Psychiatric/Behavioral: Negative for confusion and decreased concentration.    Allergies  Ibuprofen  Home Medications   Current Outpatient Rx  Name  Route  Sig  Dispense  Refill  . ALBUTEROL SULFATE HFA 108 (90 BASE) MCG/ACT IN AERS   Inhalation   Inhale 2 puffs into the lungs every 6 (six) hours as needed. Takes for shortness of breath         . ALPRAZOLAM 0.5 MG PO TABS   Oral   Take 0.5 mg by mouth at bedtime as needed. Takes for depression         . BUDESONIDE-FORMOTEROL FUMARATE 160-4.5 MCG/ACT IN AERO   Inhalation   Inhale 2 puffs into the lungs 2 (two) times daily.         . CYCLOBENZAPRINE HCL 10 MG PO TABS   Oral   Take 10 mg by mouth 3 (three) times daily as needed. Takes for muscle spasms         . DICLOFENAC SODIUM 1 % TD GEL   Topical   Apply 2 g topically 4 (four) times daily.         Marland Kitchen LOSARTAN POTASSIUM 25 MG PO TABS   Oral   Take 25 mg by mouth daily.         Marland Kitchen MONTELUKAST SODIUM 10 MG PO TABS   Oral   Take 10 mg by mouth at bedtime.         Marland Kitchen  OMEPRAZOLE PO   Oral   Take 1 tablet by mouth daily.         . OXYCODONE-ACETAMINOPHEN 10-325 MG PO TABS   Oral   Take 1 tablet by mouth every 4 (four) hours as needed. Takes for pain         . OXYMORPHONE HCL ER 20 MG PO TB12   Oral   Take 20 mg by mouth every 12 (twelve) hours.         Marland Kitchen PREGABALIN 75 MG PO CAPS   Oral   Take 75 mg by mouth 3 (three) times daily.         . SULFAMETHOXAZOLE-TRIMETHOPRIM 800-160 MG PO TABS   Oral   Take 1 tablet by mouth 2 (two) times daily.   14 tablet   0     BP 116/83  Pulse 90  Temp 98.5 F (36.9 C)  Resp 21  SpO2 97%  LMP 02/05/2012  Physical Exam  Constitutional: She is oriented to person, place, and time. She appears well-developed and  well-nourished. No distress.  HENT:  Head: Normocephalic and atraumatic.  Eyes: Conjunctivae normal are normal. Right eye exhibits no discharge. Left eye exhibits no discharge. No scleral icterus.  Neck: Normal range of motion. Neck supple. No JVD present.  Cardiovascular: Normal rate, regular rhythm, normal heart sounds and intact distal pulses.   No murmur heard. Pulmonary/Chest: Tachypnea noted. No respiratory distress. She has no decreased breath sounds. She has wheezes in the right upper field, the right middle field, the right lower field, the left upper field, the left middle field and the left lower field. She has no rhonchi. She has rales in the right lower field and the left lower field. She exhibits no tenderness.  Abdominal: Soft. Bowel sounds are normal. She exhibits no distension and no mass. There is no tenderness. There is no rebound and no guarding.  Musculoskeletal: Normal range of motion. She exhibits no edema and no tenderness.  Neurological: She is alert and oriented to person, place, and time.  Skin: Skin is warm. She is diaphoretic.  Psychiatric: She has a normal mood and affect.    ED Course  Procedures (including critical care time)  Labs Reviewed  CBC WITH DIFFERENTIAL - Abnormal; Notable for the following:    Neutrophils Relative 78 (*)     Neutro Abs 8.0 (*)     All other components within normal limits  COMPREHENSIVE METABOLIC PANEL - Abnormal; Notable for the following:    Glucose, Bld 110 (*)     Total Protein 8.9 (*)     GFR calc non Af Amer 69 (*)     GFR calc Af Amer 80 (*)     All other components within normal limits  URINALYSIS, ROUTINE W REFLEX MICROSCOPIC - Abnormal; Notable for the following:    Color, Urine AMBER (*)  BIOCHEMICALS MAY BE AFFECTED BY COLOR   APPearance HAZY (*)     Specific Gravity, Urine 1.034 (*)     Hgb urine dipstick SMALL (*)     Bilirubin Urine SMALL (*)     Ketones, ur 15 (*)     Protein, ur >300 (*)     All other  components within normal limits  URINE MICROSCOPIC-ADD ON - Abnormal; Notable for the following:    Squamous Epithelial / LPF FEW (*)     Bacteria, UA FEW (*)     Casts HYALINE CASTS (*)     All other components within  normal limits  CULTURE, BLOOD (ROUTINE X 2)  CULTURE, BLOOD (ROUTINE X 2)  LACTIC ACID, PLASMA  URINE CULTURE   Dg Chest 2 View  05/05/2012  *RADIOLOGY REPORT*  Clinical Data: Cough for 3 days  CHEST - 2 VIEW  Comparison: 02/11/2007  Findings: Normal lung volumes.  No effusions or edema.  Normal heart size.  Hazy lung opacities are identified in both lung bases. The review of the visualized osseous structures is unremarkable.  IMPRESSION:  1.  Hazy bilateral lung opacities within the lower lobes may represent infiltrates and/or atelectasis.   Original Report Authenticated By: Signa Kell, M.D.      1. Community acquired pneumonia   2. Asthma exacerbation   3. Productive cough      EKG reviewed and interpreted. Sinus rhythm rate 92. Normal axis. Normal intervals. No T wave abnormalities. No ST segment changes. MDM  Patient is a 42 year old female with history of asthma presenting with 3 days of productive cough, shortness of breath, as is a difficult IV access thma exacerbation not improved with home albuterol and nebulized treatments. Patient seen in urgent care diagnosed with bilateral lower lobe pneumonia. Sent here for evaluation. One episode of hypoxia. Symptoms improved after continuous nebulized treatment. Vital stable. Patient given azithromycin and Rocephin. Of note patient patient has difficult IV access. Blood was obtained but the patient refused further IV attempts for blood cultures. Admitted to hospitalist service for severe asthma exacerbation likely secondary to an underlying community acquired pneumonia.        Consuello Masse, MD 05/06/12 0001

## 2012-05-05 NOTE — Progress Notes (Signed)
ANTIBIOTIC CONSULT NOTE - INITIAL  Pharmacy Consult for azithromycin + ceftriaxone Indication: rule out pneumonia  Allergies  Allergen Reactions  . Ibuprofen Hives    Hives across the face    Patient Measurements:   Adjusted Body Weight:   Vital Signs: Temp: 98.5 F (36.9 C) (01/10 1849) Temp src: Oral (01/10 1607) BP: 126/85 mmHg (01/10 1916) Pulse Rate: 98  (01/10 1916) Intake/Output from previous day:   Intake/Output from this shift:    Labs: No results found for this basename: WBC:3,HGB:3,PLT:3,LABCREA:3,CREATININE:3 in the last 72 hours The CrCl is unknown because both a height and weight (above a minimum accepted value) are required for this calculation. No results found for this basename: VANCOTROUGH:2,VANCOPEAK:2,VANCORANDOM:2,GENTTROUGH:2,GENTPEAK:2,GENTRANDOM:2,TOBRATROUGH:2,TOBRAPEAK:2,TOBRARND:2,AMIKACINPEAK:2,AMIKACINTROU:2,AMIKACIN:2, in the last 72 hours   Microbiology: No results found for this or any previous visit (from the past 720 hour(s)).  Medical History: Past Medical History  Diagnosis Date  . Asthma   . Hypertension   . Fibromyalgia     Medications:  Anti-infectives     Start     Dose/Rate Route Frequency Ordered Stop   05/05/12 1945   cefTRIAXone (ROCEPHIN) 1 g in dextrose 5 % 50 mL IVPB        1 g 100 mL/hr over 30 Minutes Intravenous  Once 05/05/12 1945     05/05/12 1945   azithromycin (ZITHROMAX) 500 mg in dextrose 5 % 250 mL IVPB        500 mg 250 mL/hr over 60 Minutes Intravenous  Once 05/05/12 1945           Assessment: 41 yof presented to the ED with a 3 day history of SOB and productive cough. To begin empiric antbiotics for community acquired pneumonia.   Goal of Therapy:  Eradication of infection  Plan:  1. Azithromycin 500mg  IV Q24H 2. Ceftriaxone 1gm IV Q24H 3. Pharmacy will sign-off as these antibiotics require no renal adjustment or other laboratory monitoring.  Thank you for the consult and please re-consult  pharmacy if anything else is needed.   Jaeven Wanzer, Drake Leach 05/05/2012,7:54 PM

## 2012-05-06 ENCOUNTER — Encounter (HOSPITAL_COMMUNITY): Payer: Self-pay | Admitting: *Deleted

## 2012-05-06 DIAGNOSIS — J189 Pneumonia, unspecified organism: Principal | ICD-10-CM

## 2012-05-06 DIAGNOSIS — J45901 Unspecified asthma with (acute) exacerbation: Secondary | ICD-10-CM

## 2012-05-06 DIAGNOSIS — J111 Influenza due to unidentified influenza virus with other respiratory manifestations: Secondary | ICD-10-CM | POA: Diagnosis present

## 2012-05-06 LAB — BASIC METABOLIC PANEL
Calcium: 8.9 mg/dL (ref 8.4–10.5)
Chloride: 101 mEq/L (ref 96–112)
Creatinine, Ser: 0.67 mg/dL (ref 0.50–1.10)
GFR calc Af Amer: >90 mL/min (ref >90)
GFR calc non Af Amer: >90 mL/min (ref >90)

## 2012-05-06 LAB — HIV ANTIBODY (ROUTINE TESTING W REFLEX): HIV: NONREACTIVE

## 2012-05-06 LAB — CBC
MCV: 84.9 fL (ref 78.0–100.0)
Platelets: 392 10*3/uL (ref 150–400)
RDW: 14.1 % (ref 11.5–15.5)
WBC: 7 10*3/uL (ref 4.0–10.5)

## 2012-05-06 LAB — LEGIONELLA ANTIGEN, URINE: Legionella Antigen, Urine: NEGATIVE

## 2012-05-06 LAB — INFLUENZA PANEL BY PCR (TYPE A & B): Influenza A By PCR: NEGATIVE

## 2012-05-06 MED ORDER — BUDESONIDE-FORMOTEROL FUMARATE 160-4.5 MCG/ACT IN AERO
2.0000 | INHALATION_SPRAY | Freq: Two times a day (BID) | RESPIRATORY_TRACT | Status: DC
  Administered 2012-05-06 – 2012-05-09 (×7): 2 via RESPIRATORY_TRACT
  Filled 2012-05-06: qty 6

## 2012-05-06 MED ORDER — DEXTROSE 5 % IV SOLN: 1.0000 g | INTRAVENOUS | Status: DC

## 2012-05-06 MED ORDER — AZITHROMYCIN 500 MG PO TABS
500.0000 mg | ORAL_TABLET | ORAL | Status: DC
  Administered 2012-05-06 – 2012-05-08 (×3): 500 mg via ORAL
  Filled 2012-05-06 (×4): qty 1

## 2012-05-06 MED ORDER — DEXTROSE 5 % IV SOLN: 500.0000 mg | INTRAVENOUS | Status: DC

## 2012-05-06 MED ORDER — ALBUTEROL SULFATE HFA 108 (90 BASE) MCG/ACT IN AERS: 2.0000 | INHALATION_SPRAY | Freq: Four times a day (QID) | RESPIRATORY_TRACT | Status: DC | PRN

## 2012-05-06 MED ORDER — FUROSEMIDE 10 MG/ML IJ SOLN
40.0000 mg | Freq: Once | INTRAMUSCULAR | Status: AC
  Administered 2012-05-06: 40 mg via INTRAVENOUS
  Filled 2012-05-06: qty 4

## 2012-05-06 MED ORDER — FUROSEMIDE 10 MG/ML IJ SOLN
40.0000 mg | Freq: Two times a day (BID) | INTRAMUSCULAR | Status: DC
  Administered 2012-05-06 – 2012-05-09 (×6): 40 mg via INTRAVENOUS
  Filled 2012-05-06 (×9): qty 4

## 2012-05-06 MED ORDER — DEXTROSE 5 % IV SOLN
1.0000 g | INTRAVENOUS | Status: DC
  Administered 2012-05-07: 1 g via INTRAVENOUS
  Filled 2012-05-06 (×3): qty 10

## 2012-05-06 MED ORDER — PREGABALIN 75 MG PO CAPS
75.0000 mg | ORAL_CAPSULE | Freq: Three times a day (TID) | ORAL | Status: DC
  Administered 2012-05-06 – 2012-05-09 (×10): 75 mg via ORAL
  Filled 2012-05-06 (×10): qty 1

## 2012-05-06 MED ORDER — ALPRAZOLAM 0.5 MG PO TABS: 0.5000 mg | ORAL_TABLET | Freq: Every evening | ORAL | Status: DC | PRN

## 2012-05-06 MED ORDER — LOSARTAN POTASSIUM 25 MG PO TABS
25.0000 mg | ORAL_TABLET | Freq: Every day | ORAL | Status: DC
  Administered 2012-05-06 – 2012-05-09 (×4): 25 mg via ORAL
  Filled 2012-05-06 (×4): qty 1

## 2012-05-06 MED ORDER — OXYCODONE-ACETAMINOPHEN 5-325 MG PO TABS
1.0000 | ORAL_TABLET | ORAL | Status: DC | PRN
  Administered 2012-05-09: 1 via ORAL
  Filled 2012-05-06: qty 1

## 2012-05-06 MED ORDER — OXYCODONE-ACETAMINOPHEN 10-325 MG PO TABS: 1.0000 | ORAL_TABLET | ORAL | Status: DC | PRN

## 2012-05-06 MED ORDER — OSELTAMIVIR PHOSPHATE 75 MG PO CAPS
75.0000 mg | ORAL_CAPSULE | Freq: Two times a day (BID) | ORAL | Status: DC
  Administered 2012-05-06: 75 mg via ORAL
  Filled 2012-05-06 (×2): qty 1

## 2012-05-06 MED ORDER — CYCLOBENZAPRINE HCL 10 MG PO TABS: 10.0000 mg | ORAL_TABLET | Freq: Three times a day (TID) | ORAL | Status: DC | PRN

## 2012-05-06 MED ORDER — OXYCODONE HCL 5 MG PO TABS
5.0000 mg | ORAL_TABLET | ORAL | Status: DC | PRN
  Administered 2012-05-09: 5 mg via ORAL
  Filled 2012-05-06: qty 1

## 2012-05-06 MED ORDER — BIOTENE DRY MOUTH MT LIQD
15.0000 mL | Freq: Two times a day (BID) | OROMUCOSAL | Status: DC
  Administered 2012-05-06 – 2012-05-08 (×6): 15 mL via OROMUCOSAL

## 2012-05-06 MED ORDER — MAGNESIUM HYDROXIDE 400 MG/5ML PO SUSP: 5.0000 mL | Freq: Every day | ORAL | Status: DC | PRN

## 2012-05-06 MED ORDER — PNEUMOCOCCAL VAC POLYVALENT 25 MCG/0.5ML IJ INJ
0.5000 mL | INJECTION | INTRAMUSCULAR | Status: AC
  Administered 2012-05-07: 0.5 mL via INTRAMUSCULAR
  Filled 2012-05-06: qty 0.5

## 2012-05-06 MED ORDER — OXYMORPHONE HCL ER 10 MG PO T12A
20.0000 mg | EXTENDED_RELEASE_TABLET | Freq: Two times a day (BID) | ORAL | Status: DC
  Administered 2012-05-06 – 2012-05-09 (×7): 20 mg via ORAL
  Filled 2012-05-06 (×7): qty 2

## 2012-05-06 MED ORDER — PANTOPRAZOLE SODIUM 40 MG PO TBEC
40.0000 mg | DELAYED_RELEASE_TABLET | Freq: Every day | ORAL | Status: DC
  Administered 2012-05-06 – 2012-05-09 (×4): 40 mg via ORAL
  Filled 2012-05-06 (×4): qty 1

## 2012-05-06 MED ORDER — HEPARIN SODIUM (PORCINE) 5000 UNIT/ML IJ SOLN
5000.0000 [IU] | Freq: Three times a day (TID) | INTRAMUSCULAR | Status: DC
  Administered 2012-05-06 – 2012-05-09 (×10): 5000 [IU] via SUBCUTANEOUS
  Filled 2012-05-06 (×13): qty 1

## 2012-05-06 MED ORDER — MONTELUKAST SODIUM 10 MG PO TABS
10.0000 mg | ORAL_TABLET | Freq: Every day | ORAL | Status: DC
  Administered 2012-05-06 – 2012-05-08 (×3): 10 mg via ORAL
  Filled 2012-05-06 (×4): qty 1

## 2012-05-06 MED ORDER — INFLUENZA VIRUS VACC SPLIT PF IM SUSP
0.5000 mL | INTRAMUSCULAR | Status: AC
  Administered 2012-05-07: 0.5 mL via INTRAMUSCULAR
  Filled 2012-05-06: qty 0.5

## 2012-05-06 NOTE — H&P (Signed)
[facility] History and Physical  Felicia Molina ZOX:[id number] DOB: 1970/07/11 DOA: 05/05/2012  Referring physician: ED PCP: Dorrene German, MD  Specialists: None  Chief Complaint: Cough, SOB  HPI: Felicia Molina is a 42 y.o. female who presents with c/o cough and SOB.  Symptoms onset 3 days ago, not improved nor worsened by anything, also causing worsening of her asthma.  Occuring in the context of known exposure to influenza (patients daughter has confirmed influenza).  Patient is unsure wether she did or didn't get the flu shot this year, she knows her daughter did not.  In the ED work up revealed bilateral PNA, patient was 88% on RA which improved with O2 therapy, hospitalist has been asked to admit for CAP PNA.  Review of Systems: Positive for fevers, chills, muscle aches, URI symptoms, 12 systems reviewed and otherwise negative.  Past Medical History  Diagnosis Date  . Asthma   . Hypertension   . Fibromyalgia    Past Surgical History  Procedure Date  . Back surgery   . Carpal tunnel release    Social History:  reports that she has never smoked. She does not have any smokeless tobacco history on file. She reports that she does not drink alcohol or use illicit drugs.   Allergies  Allergen Reactions  . Ibuprofen Hives    Hives across the face    No family history on file. Daughter has laboratory confirmed influenza infection.  Prior to Admission medications   Medication Sig Start Date End Date Taking? Authorizing Provider  albuterol (PROVENTIL HFA;VENTOLIN HFA) 108 (90 BASE) MCG/ACT inhaler Inhale 2 puffs into the lungs every 6 (six) hours as needed. Takes for shortness of breath   Yes Historical Provider, MD  ALPRAZolam (XANAX) 0.5 MG tablet Take 0.5 mg by mouth at bedtime as needed. Takes for depression   Yes Historical Provider, MD  budesonide-formoterol (SYMBICORT) 160-4.5 MCG/ACT inhaler Inhale 2 puffs into the lungs 2 (two) times daily.   Yes Historical  Provider, MD  cyclobenzaprine (FLEXERIL) 10 MG tablet Take 10 mg by mouth 3 (three) times daily as needed. Takes for muscle spasms   Yes Historical Provider, MD  diclofenac sodium (VOLTAREN) 1 % GEL Apply 2 g topically 4 (four) times daily.   Yes Historical Provider, MD  losartan (COZAAR) 25 MG tablet Take 25 mg by mouth daily.   Yes Historical Provider, MD  montelukast (SINGULAIR) 10 MG tablet Take 10 mg by mouth at bedtime.   Yes Historical Provider, MD  OMEPRAZOLE PO Take 1 tablet by mouth daily.   Yes Historical Provider, MD  oxyCODONE-acetaminophen (PERCOCET) 10-325 MG per tablet Take 1 tablet by mouth every 4 (four) hours as needed. Takes for pain   Yes Historical Provider, MD  oxymorphone (OPANA ER) 20 MG 12 hr tablet Take 20 mg by mouth every 12 (twelve) hours.   Yes Historical Provider, MD  pregabalin (LYRICA) 75 MG capsule Take 75 mg by mouth 3 (three) times daily.   Yes Historical Provider, MD   Physical Exam: Filed Vitals:   05/05/12 2200 05/05/12 2300 05/06/12 0000 05/06/12 0100  BP: 121/72 121/70 123/82 120/72  Pulse: 108 102 100 94  Temp:      Resp:  17 34 22  SpO2: 88% 94% 94% 94%    General:  NAD, resting comfortably in bed Eyes: PEERLA EOMI ENT: mucous membranes moist Neck: supple w/o JVD Cardiovascular: RRR w/o MRG Respiratory: tachypnic, rales B lower lung fields Abdomen: soft, nt, nd, bs+ Skin: sweaty  Musculoskeletal: MAE, full ROM all 4 extremities Psychiatric: normal tone and affect Neurologic: AAOx3, grossly non-focal  Labs on Admission:  Basic Metabolic Panel:  Lab 05/05/12 0454  NA 137  K 5.1  CL 99  CO2 24  GLUCOSE 110*  BUN 11  CREATININE 1.00  CALCIUM 9.9  MG --  PHOS --   Liver Function Tests:  Lab 05/05/12 1933  AST 36  ALT 33  ALKPHOS 76  BILITOT 0.5  PROT 8.9*  ALBUMIN 3.8   No results found for this basename: LIPASE:5,AMYLASE:5 in the last 168 hours No results found for this basename: AMMONIA:5 in the last 168  hours CBC:  Lab 05/05/12 1933  WBC 10.2  NEUTROABS 8.0*  HGB 13.4  HCT 39.9  MCV 86.2  PLT 374   Cardiac Enzymes: No results found for this basename: CKTOTAL:5,CKMB:5,CKMBINDEX:5,TROPONINI:5 in the last 168 hours  BNP (last 3 results) No results found for this basename: PROBNP:3 in the last 8760 hours CBG: No results found for this basename: GLUCAP:5 in the last 168 hours  Radiological Exams on Admission: Dg Chest 2 View  05/05/2012  *RADIOLOGY REPORT*  Clinical Data: Cough for 3 days  CHEST - 2 VIEW  Comparison: 02/11/2007  Findings: Normal lung volumes.  No effusions or edema.  Normal heart size.  Hazy lung opacities are identified in both lung bases. The review of the visualized osseous structures is unremarkable.  IMPRESSION:  1.  Hazy bilateral lung opacities within the lower lobes may represent infiltrates and/or atelectasis.   Original Report Authenticated By: Signa Kell, M.D.     EKG: Independently reviewed.  Assessment/Plan Principal Problem:  *CAP (community acquired pneumonia) Active Problems:  Influenza-like illness   1. CAP - continuing rocephin and azithromycin started in ED.  Strong suspicion of influenza so starting tamiflu, influenza PCR ordered, confirmed influenza case in her household.  O2 via protocol, PRN albuterol for asthma, continuing other home meds for asthma.  Continuous pulse ox.  Cultures pending. 2. ILI - as above 3. Asthma - continue home nebs and O2, being exacerbated by CAP 4. HTN - continue home meds    Code Status: Full Code (must indicate code status--if unknown or must be presumed, indicate so) Family Communication: Spoke with husband in room (and suggested to him if/when he started developing symptoms in the next few days get in to a UC to get evaluated / prescribed tamiflu) (indicate person spoken with, if applicable, with phone number if by telephone) Disposition Plan: Admit to inpatient (indicate anticipated LOS)  Time spent: 70  min  Donny Heffern M. [facility] Pager [phone]  If 7PM-7AM, please contact night-coverage [email or web address] Password [email or web address] 05/06/2012, 2:13 AM

## 2012-05-06 NOTE — ED Provider Notes (Signed)
I saw and evaluated the patient, reviewed the resident's note and I agree with the findings and plan.  I reviewed and agree with resident ECG interpretation.  Pt with CXR suggestive of pneumonia, community. Pt with pre-existing h/o asthma. Pt with continued SOB, wheezing, will admit for continued nebs, respiratory support, IV abx.      Felicia Molina. Oletta Lamas, MD 05/06/12 4098

## 2012-05-06 NOTE — Progress Notes (Signed)
   Patient seen by my colleague Dr. Julian Reil earlier today.  Patient seen and examined, data base reviewed.  SOB and cough bronchitis versus pneumonia. Patient was hypoxic to 88% on room air in the ED.  Flu PCR is negative, discontinue Tamiflu. Continue azithromycin and Rocephin.  Clint Lipps Pager: [phone] 05/06/2012, 3:24 PM

## 2012-05-07 DIAGNOSIS — J9600 Acute respiratory failure, unspecified whether with hypoxia or hypercapnia: Secondary | ICD-10-CM

## 2012-05-07 DIAGNOSIS — I10 Essential (primary) hypertension: Secondary | ICD-10-CM

## 2012-05-07 LAB — PRO B NATRIURETIC PEPTIDE: Pro B Natriuretic peptide (BNP): 9.4 pg/mL (ref 0–125)

## 2012-05-07 LAB — URINE CULTURE
Colony Count: NO GROWTH
Culture: NO GROWTH

## 2012-05-07 LAB — BASIC METABOLIC PANEL
BUN: 18 mg/dL (ref 6–23)
Calcium: 8.8 mg/dL (ref 8.4–10.5)
Creatinine, Ser: 0.82 mg/dL (ref 0.50–1.10)
GFR calc Af Amer: >90 mL/min (ref >90)
GFR calc non Af Amer: 87 mL/min — ABNORMAL LOW (ref >90)
Glucose, Bld: 92 mg/dL (ref 70–99)

## 2012-05-07 LAB — CBC
MCH: 27.9 pg (ref 26.0–34.0)
MCHC: 31.8 g/dL (ref 30.0–36.0)
Platelets: 431 10*3/uL — ABNORMAL HIGH (ref 150–400)
RDW: 14.4 % (ref 11.5–15.5)

## 2012-05-07 LAB — EXPECTORATED SPUTUM ASSESSMENT W GRAM STAIN, RFLX TO RESP C

## 2012-05-07 MED ORDER — METHYLPREDNISOLONE SODIUM SUCC 125 MG IJ SOLR
80.0000 mg | Freq: Three times a day (TID) | INTRAMUSCULAR | Status: DC
  Administered 2012-05-07 – 2012-05-08 (×3): 80 mg via INTRAVENOUS
  Filled 2012-05-07 (×6): qty 1.28

## 2012-05-07 MED ORDER — ALBUTEROL SULFATE (5 MG/ML) 0.5% IN NEBU: 2.5000 mg | INHALATION_SOLUTION | RESPIRATORY_TRACT | Status: DC | PRN

## 2012-05-07 MED ORDER — IPRATROPIUM BROMIDE 0.02 % IN SOLN
0.5000 mg | Freq: Four times a day (QID) | RESPIRATORY_TRACT | Status: DC
  Administered 2012-05-07 – 2012-05-09 (×9): 0.5 mg via RESPIRATORY_TRACT
  Filled 2012-05-07 (×10): qty 2.5

## 2012-05-07 MED ORDER — GUAIFENESIN ER 600 MG PO TB12
1200.0000 mg | ORAL_TABLET | Freq: Two times a day (BID) | ORAL | Status: DC
  Administered 2012-05-07 – 2012-05-09 (×5): 1200 mg via ORAL
  Filled 2012-05-07 (×6): qty 2

## 2012-05-07 MED ORDER — GUAIFENESIN-DM 100-10 MG/5ML PO SYRP
5.0000 mL | ORAL_SOLUTION | ORAL | Status: DC | PRN
  Administered 2012-05-07 – 2012-05-09 (×4): 5 mL via ORAL
  Filled 2012-05-07 (×4): qty 5

## 2012-05-07 MED ORDER — ALBUTEROL SULFATE (5 MG/ML) 0.5% IN NEBU
2.5000 mg | INHALATION_SOLUTION | Freq: Four times a day (QID) | RESPIRATORY_TRACT | Status: DC
  Administered 2012-05-07 – 2012-05-09 (×11): 2.5 mg via RESPIRATORY_TRACT
  Filled 2012-05-07 (×12): qty 0.5

## 2012-05-07 MED ORDER — ALBUTEROL SULFATE (5 MG/ML) 0.5% IN NEBU: 2.5000 mg | INHALATION_SOLUTION | Freq: Four times a day (QID) | RESPIRATORY_TRACT | Status: DC

## 2012-05-07 MED ORDER — POTASSIUM CHLORIDE CRYS ER 20 MEQ PO TBCR
40.0000 meq | EXTENDED_RELEASE_TABLET | Freq: Once | ORAL | Status: AC
  Administered 2012-05-07: 40 meq via ORAL
  Filled 2012-05-07: qty 2

## 2012-05-07 NOTE — Progress Notes (Signed)
[facility] PROGRESS NOTE  Felicia Molina AVW:[id number] DOB: 27-Sep-1970 DOA: 05/05/2012 PCP: Dorrene German, MD  HPI/Subjective: Still very short of breath, has a lot of cough but she is not able to produce phlegm    Assessment/Plan:  Pneumonia -Community acquired pneumonia, started on Rocephin and azithromycin.  -Continue mucolytics, inhaled bronchodilators and antitussives. -Oxygen as needed, she requires about 3-4 L of oxygen to keep her saturation above 90%.  Acute hypoxic respiratory failure -Patient was satting 88% on room air, currently she needs 3-4 L of oxygen to keep saturation above 90%. -This is secondary to pneumonia and asthma exacerbation, she has a negative flu PCR. -Started also on Lasix for mild fluid overload. -Follow closely, if oxygen needs worsen she might need to go to step down or ICU.  Asthma -Asthma exacerbation, exacerbated because of upper respiratory tract infection. -She is on mucolytics, inhaled bronchodilators and antitussives. -Will start steroids.  Hypertension -Continue preadmission home medications.  Code Status: Full code Family Communication: Husband is at bedside Disposition Plan: Remains in patient   Consultants:  None  Procedures:  None  Antibiotics:  Rocephin and his azithromycin and started on 05/05/2012  Objective: Filed Vitals:   05/06/12 2207 05/07/12 0120 05/07/12 0433 05/07/12 0828  BP: 102/67  119/76   Pulse: 89 84 89   Temp: 98.4 F (36.9 C)  98.3 F (36.8 C)   TempSrc: Oral  Oral   Resp: 20 22 20    Height:      Weight:      SpO2: 97% 98% 97% 100%    Intake/Output Summary (Last 24 hours) at 05/07/12 1028 Last data filed at 05/06/12 1859  Gross per 24 hour  Intake    422 ml  Output      0 ml  Net    422 ml   Filed Weights   05/06/12 0100 05/06/12 0448  Weight: 136.1 kg (300 lb 0.7 oz) 128.3 kg (282 lb 13.6 oz)    Exam:  General: Alert and awake, oriented x3, not in any acute  distress. HEENT: anicteric sclera, pupils reactive to light and accommodation, EOMI CVS: S1-S2 clear, no murmur rubs or gallops Chest: clear to auscultation bilaterally, no wheezing, rales or rhonchi Abdomen: soft nontender, nondistended, normal bowel sounds, no organomegaly Extremities: no cyanosis, clubbing or edema noted bilaterally Neuro: Cranial nerves II-XII intact, no focal neurological deficits  Data Reviewed: Basic Metabolic Panel:  Lab 05/07/12 8295 05/06/12 0800 05/05/12 1933  NA 137 135 137  K 3.9 4.5 5.1  CL 101 101 99  CO2 23 21 24   GLUCOSE 92 125* 110*  BUN 18 11 11   CREATININE 0.82 0.67 1.00  CALCIUM 8.8 8.9 9.9  MG -- -- --  PHOS -- -- --   Liver Function Tests:  Lab 05/05/12 1933  AST 36  ALT 33  ALKPHOS 76  BILITOT 0.5  PROT 8.9*  ALBUMIN 3.8   No results found for this basename: LIPASE:5,AMYLASE:5 in the last 168 hours No results found for this basename: AMMONIA:5 in the last 168 hours CBC:  Lab 05/07/12 0700 05/06/12 0800 05/05/12 1933  WBC 11.7* 7.0 10.2  NEUTROABS -- -- 8.0*  HGB 11.3* 11.9* 13.4  HCT 35.5* 35.4* 39.9  MCV 87.7 84.9 86.2  PLT 431* 392 374   Cardiac Enzymes: No results found for this basename: CKTOTAL:5,CKMB:5,CKMBINDEX:5,TROPONINI:5 in the last 168 hours BNP (last 3 results)  Basename 05/07/12 0700  PROBNP 9.4   CBG: No results found for this basename: GLUCAP:5  in the last 168 hours  Recent Results (from the past 240 hour(s))  URINE CULTURE     Status: Normal   Collection Time   05/05/12  8:20 PM      Component Value Range Status Comment   Specimen Description URINE, CLEAN CATCH   Final    Special Requests NONE   Final    Culture  Setup Time 05/06/2012 12:45   Final    Colony Count NO GROWTH   Final    Culture NO GROWTH   Final    Report Status 05/07/2012 FINAL   Final   CULTURE, EXPECTORATED SPUTUM-ASSESSMENT     Status: Normal   Collection Time   05/07/12  1:00 AM      Component Value Range Status Comment    Specimen Description SPUTUM   Final    Special Requests NONE   Final    Sputum evaluation     Final    Value: MICROSCOPIC FINDINGS SUGGEST THAT THIS SPECIMEN IS NOT REPRESENTATIVE OF LOWER RESPIRATORY SECRETIONS. PLEASE RECOLLECT.     CALLED TO G.OLOAA,RN 0235 05/07/12 M.CAMPBELL   Report Status 05/07/2012 FINAL   Final      Studies: Dg Chest 2 View  05/05/2012  *RADIOLOGY REPORT*  Clinical Data: Cough for 3 days  CHEST - 2 VIEW  Comparison: 02/11/2007  Findings: Normal lung volumes.  No effusions or edema.  Normal heart size.  Hazy lung opacities are identified in both lung bases. The review of the visualized osseous structures is unremarkable.  IMPRESSION:  1.  Hazy bilateral lung opacities within the lower lobes may represent infiltrates and/or atelectasis.   Original Report Authenticated By: Signa Kell, M.D.     Scheduled Meds:   . albuterol  2.5 mg Nebulization QID  . antiseptic oral rinse  15 mL Mouth Rinse BID  . azithromycin  500 mg Oral Q24H  . budesonide-formoterol  2 puff Inhalation BID  . cefTRIAXone (ROCEPHIN)  IV  1 g Intravenous Q24H  . furosemide  40 mg Intravenous BID  . heparin  5,000 Units Subcutaneous Q8H  . influenza  inactive virus vaccine  0.5 mL Intramuscular Tomorrow-1000  . losartan  25 mg Oral Daily  . montelukast  10 mg Oral QHS  . oxymorphone  20 mg Oral Q12H  . pantoprazole  40 mg Oral Daily  . pneumococcal 23 valent vaccine  0.5 mL Intramuscular Tomorrow-1000  . pregabalin  75 mg Oral TID   Continuous Infusions:   Principal Problem:  *CAP (community acquired pneumonia) Active Problems:  Influenza-like illness    Time spent: 40 minutes    [facility] A  [facility] Pager [phone]. If 8PM-8AM, please contact night-coverage at [email or web address], password [facility] 05/07/2012, 10:28 AM  LOS: 2 days

## 2012-05-08 ENCOUNTER — Inpatient Hospital Stay (HOSPITAL_COMMUNITY): Payer: Medicaid Other

## 2012-05-08 LAB — BASIC METABOLIC PANEL
Calcium: 9.8 mg/dL (ref 8.4–10.5)
GFR calc Af Amer: >90 mL/min (ref >90)
GFR calc non Af Amer: >90 mL/min (ref >90)
Glucose, Bld: 164 mg/dL — ABNORMAL HIGH (ref 70–99)
Sodium: 135 mEq/L (ref 135–145)

## 2012-05-08 MED ORDER — PREDNISONE 50 MG PO TABS
60.0000 mg | ORAL_TABLET | Freq: Every day | ORAL | Status: DC
  Administered 2012-05-08 – 2012-05-09 (×2): 60 mg via ORAL
  Filled 2012-05-08 (×4): qty 1

## 2012-05-08 NOTE — Progress Notes (Signed)
[facility] PROGRESS NOTE  Felicia Molina YNW:[id number] DOB: 1970-08-04 DOA: 05/05/2012 PCP: Dorrene German, MD  HPI/Subjective: Shortness of breath in improved since yesterday, we will try to wean off the oxygen    Assessment/Plan:  Pneumonia -Community acquired pneumonia, started on Rocephin and azithromycin.  -Continue mucolytics, inhaled bronchodilators and antitussives. -Oxygen as needed, she requires about 3-4 L of oxygen to keep her saturation above 90%. -Improving, wean off of the oxygen.  Acute hypoxic respiratory failure -Patient was satting 88% on room air, currently she needs 3-4 L of oxygen to keep saturation above 90%. -This is secondary to pneumonia and asthma exacerbation, she has a negative flu PCR. -Started also on Lasix for mild fluid overload. -Follow closely.  Asthma -Asthma exacerbation, exacerbated because of upper respiratory tract infection. -She is on mucolytics, inhaled bronchodilators and antitussives. -Will start steroids.  Hypertension -Continue preadmission home medications.  Code Status: Full code Family Communication: Husband is at bedside Disposition Plan: Remains in patient   Consultants:  None  Procedures:  None  Antibiotics:  Rocephin and his azithromycin and started on 05/05/2012  Objective: Filed Vitals:   05/07/12 1545 05/07/12 2224 05/08/12 0524 05/08/12 0938  BP:  133/72 105/68   Pulse:  79 71   Temp:  98.3 F (36.8 C) 97.7 F (36.5 C)   TempSrc:  Oral Oral   Resp:  18 20   Height:      Weight:      SpO2: 100% 100% 95% 94%    Intake/Output Summary (Last 24 hours) at 05/08/12 1042 Last data filed at 05/08/12 0859  Gross per 24 hour  Intake    962 ml  Output      0 ml  Net    962 ml   Filed Weights   05/06/12 0100 05/06/12 0448  Weight: 136.1 kg (300 lb 0.7 oz) 128.3 kg (282 lb 13.6 oz)    Exam:  General: Alert and awake, oriented x3, not in any acute distress. HEENT: anicteric sclera, pupils  reactive to light and accommodation, EOMI CVS: S1-S2 clear, no murmur rubs or gallops Chest: clear to auscultation bilaterally, no wheezing, rales or rhonchi Abdomen: soft nontender, nondistended, normal bowel sounds, no organomegaly Extremities: no cyanosis, clubbing or edema noted bilaterally Neuro: Cranial nerves II-XII intact, no focal neurological deficits  Data Reviewed: Basic Metabolic Panel:  Lab 05/07/12 6578 05/06/12 0800 05/05/12 1933  NA 137 135 137  K 3.9 4.5 5.1  CL 101 101 99  CO2 23 21 24   GLUCOSE 92 125* 110*  BUN 18 11 11   CREATININE 0.82 0.67 1.00  CALCIUM 8.8 8.9 9.9  MG -- -- --  PHOS -- -- --   Liver Function Tests:  Lab 05/05/12 1933  AST 36  ALT 33  ALKPHOS 76  BILITOT 0.5  PROT 8.9*  ALBUMIN 3.8   No results found for this basename: LIPASE:5,AMYLASE:5 in the last 168 hours No results found for this basename: AMMONIA:5 in the last 168 hours CBC:  Lab 05/07/12 0700 05/06/12 0800 05/05/12 1933  WBC 11.7* 7.0 10.2  NEUTROABS -- -- 8.0*  HGB 11.3* 11.9* 13.4  HCT 35.5* 35.4* 39.9  MCV 87.7 84.9 86.2  PLT 431* 392 374   Cardiac Enzymes: No results found for this basename: CKTOTAL:5,CKMB:5,CKMBINDEX:5,TROPONINI:5 in the last 168 hours BNP (last 3 results)  Basename 05/07/12 0700  PROBNP 9.4   CBG: No results found for this basename: GLUCAP:5 in the last 168 hours  Recent Results (from the past  240 hour(s))  URINE CULTURE     Status: Normal   Collection Time   05/05/12  8:20 PM      Component Value Range Status Comment   Specimen Description URINE, CLEAN CATCH   Final    Special Requests NONE   Final    Culture  Setup Time 05/06/2012 12:45   Final    Colony Count NO GROWTH   Final    Culture NO GROWTH   Final    Report Status 05/07/2012 FINAL   Final   CULTURE, BLOOD (ROUTINE X 2)     Status: Normal (Preliminary result)   Collection Time   05/05/12 10:56 PM      Component Value Range Status Comment   Specimen Description BLOOD RIGHT  FOREARM   Final    Special Requests     Final    Value: BOTTLES DRAWN AEROBIC ONLY 5CC PATIENT ON FOLLOWING ROCEPHIN   Culture  Setup Time 05/06/2012 12:09   Final    Culture     Final    Value:        BLOOD CULTURE RECEIVED NO GROWTH TO DATE CULTURE WILL BE HELD FOR 5 DAYS BEFORE ISSUING A FINAL NEGATIVE REPORT   Report Status PENDING   Incomplete   CULTURE, BLOOD (ROUTINE X 2)     Status: Normal (Preliminary result)   Collection Time   05/06/12  9:35 AM      Component Value Range Status Comment   Specimen Description BLOOD RIGHT HAND   Final    Special Requests BOTTLES DRAWN AEROBIC ONLY [facility]   Final    Culture  Setup Time 05/06/2012 13:21   Final    Culture     Final    Value:        BLOOD CULTURE RECEIVED NO GROWTH TO DATE CULTURE WILL BE HELD FOR 5 DAYS BEFORE ISSUING A FINAL NEGATIVE REPORT   Report Status PENDING   Incomplete   CULTURE, EXPECTORATED SPUTUM-ASSESSMENT     Status: Normal   Collection Time   05/07/12  1:00 AM      Component Value Range Status Comment   Specimen Description SPUTUM   Final    Special Requests NONE   Final    Sputum evaluation     Final    Value: MICROSCOPIC FINDINGS SUGGEST THAT THIS SPECIMEN IS NOT REPRESENTATIVE OF LOWER RESPIRATORY SECRETIONS. PLEASE RECOLLECT.     CALLED TO G.OLOAA,RN 0235 05/07/12 M.CAMPBELL   Report Status 05/07/2012 FINAL   Final      Studies: Dg Chest 2 View  05/08/2012  *RADIOLOGY REPORT*  Clinical Data: Cough, pneumonia  CHEST - 2 VIEW  Comparison: 05/05/2012  Findings: Mild patchy right lower lobe opacity, suspicious for pneumonia.  Scarring in the lingula/left lower lobe. No pleural effusion or pneumothorax.  Cardiomediastinal silhouette is within normal limits.  Visualized osseous structures are within normal limits.  IMPRESSION: Mild patchy right lower lobe opacity, suspicious for pneumonia.   Original Report Authenticated By: Charline Bills, M.D.     Scheduled Meds:    . albuterol  2.5 mg Nebulization QID  .  antiseptic oral rinse  15 mL Mouth Rinse BID  . azithromycin  500 mg Oral Q24H  . budesonide-formoterol  2 puff Inhalation BID  . cefTRIAXone (ROCEPHIN)  IV  1 g Intravenous Q24H  . furosemide  40 mg Intravenous BID  . guaiFENesin  1,200 mg Oral BID  . heparin  5,000 Units Subcutaneous Q8H  . ipratropium  0.5 mg Nebulization QID  . losartan  25 mg Oral Daily  . methylPREDNISolone (SOLU-MEDROL) injection  80 mg Intravenous Q8H  . montelukast  10 mg Oral QHS  . oxymorphone  20 mg Oral Q12H  . pantoprazole  40 mg Oral Daily  . pregabalin  75 mg Oral TID   Continuous Infusions:   Principal Problem:  *CAP (community acquired pneumonia) Active Problems:  Influenza-like illness  Acute respiratory failure  Hypertension    Time spent: 40 minutes    [facility] A  [facility] Pager [phone]. If 8PM-8AM, please contact night-coverage at [email or web address], password [facility] 05/08/2012, 10:42 AM  LOS: 3 days

## 2012-05-09 LAB — BASIC METABOLIC PANEL
BUN: 16 mg/dL (ref 6–23)
GFR calc Af Amer: >90 mL/min (ref >90)
GFR calc non Af Amer: >90 mL/min (ref >90)
Potassium: 4 mEq/L (ref 3.5–5.1)
Sodium: 134 mEq/L — ABNORMAL LOW (ref 135–145)

## 2012-05-09 MED ORDER — GUAIFENESIN ER 600 MG PO TB12: 1200.0000 mg | ORAL_TABLET | Freq: Two times a day (BID) | ORAL | Status: DC

## 2012-05-09 MED ORDER — FUROSEMIDE 40 MG PO TABS: 40.0000 mg | ORAL_TABLET | Freq: Every day | ORAL | Status: DC

## 2012-05-09 MED ORDER — FUROSEMIDE 40 MG PO TABS
40.0000 mg | ORAL_TABLET | Freq: Once | ORAL | Status: AC
  Administered 2012-05-09: 40 mg via ORAL
  Filled 2012-05-09: qty 1

## 2012-05-09 MED ORDER — LEVOFLOXACIN 750 MG PO TABS: 750.0000 mg | ORAL_TABLET | Freq: Every day | ORAL | Status: DC

## 2012-05-09 MED ORDER — CEFTRIAXONE SODIUM 1 G IJ SOLR
1.0000 g | Freq: Once | INTRAMUSCULAR | Status: AC
  Administered 2012-05-09: 1 g via INTRAMUSCULAR
  Filled 2012-05-09: qty 10

## 2012-05-09 MED ORDER — PREDNISONE (PAK) 10 MG PO TABS: ORAL_TABLET | ORAL | Status: DC

## 2012-05-09 NOTE — Discharge Summary (Signed)
Physician Discharge Summary  Berneda Piccininni ZOX:[id number] DOB: 1970-05-08 DOA: 05/05/2012  PCP: Dorrene German, MD  Admit date: 05/05/2012 Discharge date: 05/09/2012  Time spent: 40 minutes  Recommendations for Outpatient Follow-up:  1. Followup with primary care physician within one week  Discharge Diagnoses:  Principal Problem:  *CAP (community acquired pneumonia) Active Problems:  Influenza-like illness  Acute respiratory failure  Hypertension   Discharge Condition: Stable  Diet recommendation: Regular diet  Filed Weights   05/06/12 0100 05/06/12 0448  Weight: 136.1 kg (300 lb 0.7 oz) 128.3 kg (282 lb 13.6 oz)    History of present illness:  Felicia Molina is a 42 y.o. female who presents with c/o cough and SOB. Symptoms onset 3 days ago, not improved nor worsened by anything, also causing worsening of her asthma. Occuring in the context of known exposure to influenza (patients daughter has confirmed influenza). Patient is unsure wether she did or didn't get the flu shot this year, she knows her daughter did not.  In the ED work up revealed bilateral PNA, patient was 88% on RA which improved with O2 therapy, hospitalist has been asked to admit for CAP PNA.   Hospital Course:   1. Pneumonia: Community-acquired pneumonia, right lower lobe. Patient came into the hospital with the fever shortness of breath and cough. Chest x-ray showed right lower lobe pneumonia treated with Rocephin and azithromycin initially, patient also has been on mucolytics, inhaled bronchodilators and antitussives. She is on oxygen as needed initially she was hypoxic in the emergency department and she needed to 4 L of oxygen her saturation up above 90%. She improved and weaned off of the oxygen, she will be on 5 more days on Levaquin, prescription was given for Mucinex too.  2. Acute hypoxic respiratory failure: Patient oxygen saturation was 88% on room air in the emergency department, she is about 3-4 L  of oxygen to keep her saturation above 90%. This is probably multifactorial secondary to her pneumonia, asthma exacerbation. She also seems to have fluid overload and she will also might be affected by apnea/hypoxia syndrome. I will highly recommend for her to get sleep study as outpatient, she might benefit from CPAP at night. I discharged her on Lasix 40 mg as she was getting Lasix 40 mg twice a day in the hospital and it did seem to help. She has very low BNP so I did not check her for CHF.  3. Asthma: Asthmatic exacerbation with upper respiratory tract infection. She was started on mucolytics, inhaled bronchodilators and antitussives. Started on steroids. On the date of discharge steroids taper over 6 days.  4. Hypertension: Preadmission home medications were continued throughout the hospital stay.  Procedures:  None  Consultations:  None  Discharge Exam: Filed Vitals:   05/09/12 0511 05/09/12 0755 05/09/12 0900 05/09/12 1136  BP: 135/92     Pulse: 60     Temp: 97.7 F (36.5 C)     TempSrc: Oral     Resp: 20     Height:      Weight:      SpO2: 98% 97% 94% 93%   General: Alert and awake, oriented x3, not in any acute distress. HEENT: anicteric sclera, pupils reactive to light and accommodation, EOMI CVS: S1-S2 clear, no murmur rubs or gallops Chest: clear to auscultation bilaterally, no wheezing, rales or rhonchi Abdomen: soft nontender, nondistended, normal bowel sounds, no organomegaly Extremities: no cyanosis, clubbing or edema noted bilaterally Neuro: Cranial nerves II-XII intact, no focal neurological deficits  Discharge Instructions      Discharge Orders    Future Orders Please Complete By Expires   Diet - low sodium heart healthy      Increase activity slowly          Medication List     As of 05/09/2012 12:44 PM    STOP taking these medications         sulfamethoxazole-trimethoprim 800-160 MG per tablet   Commonly known as: BACTRIM DS,SEPTRA DS        TAKE these medications         albuterol 108 (90 BASE) MCG/ACT inhaler   Commonly known as: PROVENTIL HFA;VENTOLIN HFA   Inhale 2 puffs into the lungs every 6 (six) hours as needed. Takes for shortness of breath      ALPRAZolam 0.5 MG tablet   Commonly known as: XANAX   Take 0.5 mg by mouth at bedtime as needed. Takes for depression      budesonide-formoterol 160-4.5 MCG/ACT inhaler   Commonly known as: SYMBICORT   Inhale 2 puffs into the lungs 2 (two) times daily.      cyclobenzaprine 10 MG tablet   Commonly known as: FLEXERIL   Take 10 mg by mouth 3 (three) times daily as needed. Takes for muscle spasms      furosemide 40 MG tablet   Commonly known as: LASIX   Take 1 tablet (40 mg total) by mouth daily.      guaiFENesin 600 MG 12 hr tablet   Commonly known as: MUCINEX   Take 2 tablets (1,200 mg total) by mouth 2 (two) times daily.      levofloxacin 750 MG tablet   Commonly known as: LEVAQUIN   Take 1 tablet (750 mg total) by mouth daily.      losartan 25 MG tablet   Commonly known as: COZAAR   Take 25 mg by mouth daily.      montelukast 10 MG tablet   Commonly known as: SINGULAIR   Take 10 mg by mouth at bedtime.      OMEPRAZOLE PO   Take 1 tablet by mouth daily.      oxyCODONE-acetaminophen 10-325 MG per tablet   Commonly known as: PERCOCET   Take 1 tablet by mouth every 4 (four) hours as needed. Takes for pain      oxymorphone 20 MG 12 hr tablet   Commonly known as: OPANA ER   Take 20 mg by mouth every 12 (twelve) hours.      predniSONE 10 MG tablet   Commonly known as: STERAPRED UNI-PAK   Take 6-5-4-3-2-1 tabs PO daily till gone      pregabalin 75 MG capsule   Commonly known as: LYRICA   Take 75 mg by mouth 3 (three) times daily.      VOLTAREN 1 % Gel   Generic drug: diclofenac sodium   Apply 2 g topically 4 (four) times daily.         Follow-up Information    Follow up with AVBUERE,EDWIN A, MD. In 1 week.   Contact information:   [address] [place] [address] [phone]           The results of significant diagnostics from this hospitalization (including imaging, microbiology, ancillary and laboratory) are listed below for reference.    Significant Diagnostic Studies: Dg Chest 2 View  05/08/2012  *RADIOLOGY REPORT*  Clinical Data: Cough, pneumonia  CHEST - 2 VIEW  Comparison: 05/05/2012  Findings: Mild patchy  right lower lobe opacity, suspicious for pneumonia.  Scarring in the lingula/left lower lobe. No pleural effusion or pneumothorax.  Cardiomediastinal silhouette is within normal limits.  Visualized osseous structures are within normal limits.  IMPRESSION: Mild patchy right lower lobe opacity, suspicious for pneumonia.   Original Report Authenticated By: Charline Bills, M.D.    Dg Chest 2 View  05/05/2012  *RADIOLOGY REPORT*  Clinical Data: Cough for 3 days  CHEST - 2 VIEW  Comparison: 02/11/2007  Findings: Normal lung volumes.  No effusions or edema.  Normal heart size.  Hazy lung opacities are identified in both lung bases. The review of the visualized osseous structures is unremarkable.  IMPRESSION:  1.  Hazy bilateral lung opacities within the lower lobes may represent infiltrates and/or atelectasis.   Original Report Authenticated By: Signa Kell, M.D.     Microbiology: Recent Results (from the past 240 hour(s))  URINE CULTURE     Status: Normal   Collection Time   05/05/12  8:20 PM      Component Value Range Status Comment   Specimen Description URINE, CLEAN CATCH   Final    Special Requests NONE   Final    Culture  Setup Time 05/06/2012 12:45   Final    Colony Count NO GROWTH   Final    Culture NO GROWTH   Final    Report Status 05/07/2012 FINAL   Final   CULTURE, BLOOD (ROUTINE X 2)     Status: Normal (Preliminary result)   Collection Time   05/05/12 10:56 PM      Component Value Range Status Comment   Specimen Description BLOOD RIGHT FOREARM   Final    Special Requests     Final     Value: BOTTLES DRAWN AEROBIC ONLY 5CC PATIENT ON FOLLOWING ROCEPHIN   Culture  Setup Time 05/06/2012 12:09   Final    Culture     Final    Value:        BLOOD CULTURE RECEIVED NO GROWTH TO DATE CULTURE WILL BE HELD FOR 5 DAYS BEFORE ISSUING A FINAL NEGATIVE REPORT   Report Status PENDING   Incomplete   CULTURE, BLOOD (ROUTINE X 2)     Status: Normal (Preliminary result)   Collection Time   05/06/12  9:35 AM      Component Value Range Status Comment   Specimen Description BLOOD RIGHT HAND   Final    Special Requests BOTTLES DRAWN AEROBIC ONLY [facility]   Final    Culture  Setup Time 05/06/2012 13:21   Final    Culture     Final    Value:        BLOOD CULTURE RECEIVED NO GROWTH TO DATE CULTURE WILL BE HELD FOR 5 DAYS BEFORE ISSUING A FINAL NEGATIVE REPORT   Report Status PENDING   Incomplete   CULTURE, EXPECTORATED SPUTUM-ASSESSMENT     Status: Normal   Collection Time   05/07/12  1:00 AM      Component Value Range Status Comment   Specimen Description SPUTUM   Final    Special Requests NONE   Final    Sputum evaluation     Final    Value: MICROSCOPIC FINDINGS SUGGEST THAT THIS SPECIMEN IS NOT REPRESENTATIVE OF LOWER RESPIRATORY SECRETIONS. PLEASE RECOLLECT.     CALLED TO G.OLOAA,RN 0235 05/07/12 M.CAMPBELL   Report Status 05/07/2012 FINAL   Final      Labs: Basic Metabolic Panel:  Lab 05/09/12 1914 05/08/12 0924 05/07/12 0700  05/06/12 0800 05/05/12 1933  NA 134* 135 137 135 137  K 4.0 4.4 3.9 4.5 5.1  CL 94* 95* 101 101 99  CO2 29 23 23 21 24   GLUCOSE 103* 164* 92 125* 110*  BUN 16 15 18 11 11   CREATININE 0.73 0.65 0.82 0.67 1.00  CALCIUM 9.4 9.8 8.8 8.9 9.9  MG -- -- -- -- --  PHOS -- -- -- -- --   Liver Function Tests:  Lab 05/05/12 1933  AST 36  ALT 33  ALKPHOS 76  BILITOT 0.5  PROT 8.9*  ALBUMIN 3.8   No results found for this basename: LIPASE:5,AMYLASE:5 in the last 168 hours No results found for this basename: AMMONIA:5 in the last 168 hours CBC:  Lab 05/07/12  0700 05/06/12 0800 05/05/12 1933  WBC 11.7* 7.0 10.2  NEUTROABS -- -- 8.0*  HGB 11.3* 11.9* 13.4  HCT 35.5* 35.4* 39.9  MCV 87.7 84.9 86.2  PLT 431* 392 374   Cardiac Enzymes: No results found for this basename: CKTOTAL:5,CKMB:5,CKMBINDEX:5,TROPONINI:5 in the last 168 hours BNP: BNP (last 3 results)  Basename 05/07/12 0700  PROBNP 9.4   CBG: No results found for this basename: GLUCAP:5 in the last 168 hours     Signed:  Onita Pfluger A  [facility] 05/09/2012, 12:44 PM

## 2012-05-09 NOTE — Progress Notes (Signed)
RN attempted to hang pt's IV rocephin. Pt's IV access was leaking. RN paged IV team to start a new IV on pt. After several attempts, IV nurses weren't able to get an IV access on pt. IV team advised RN to get an order for a PICC line for pt. RN paged NP on call about the pt having no IV access who wrote for pt to get rocephin IM

## 2012-05-09 NOTE — Progress Notes (Deleted)
Physician Discharge Summary  Felicia Molina RUE:[id number] DOB: 1970/08/04 DOA: 05/05/2012  PCP: Dorrene German, MD  Admit date: 05/05/2012 Discharge date: 05/09/2012  Time spent: 40 minutes  Recommendations for Outpatient Follow-up:  1. Followup with primary care physician within one week  Discharge Diagnoses:  Principal Problem:  *CAP (community acquired pneumonia) Active Problems:  Influenza-like illness  Acute respiratory failure  Hypertension   Discharge Condition: Stable  Diet recommendation: Regular diet  Filed Weights   05/06/12 0100 05/06/12 0448  Weight: 136.1 kg (300 lb 0.7 oz) 128.3 kg (282 lb 13.6 oz)    History of present illness:  Felicia Molina is a 42 y.o. female who presents with c/o cough and SOB. Symptoms onset 3 days ago, not improved nor worsened by anything, also causing worsening of her asthma. Occuring in the context of known exposure to influenza (patients daughter has confirmed influenza). Patient is unsure wether she did or didn't get the flu shot this year, she knows her daughter did not.  In the ED work up revealed bilateral PNA, patient was 88% on RA which improved with O2 therapy, hospitalist has been asked to admit for CAP PNA.   Hospital Course:   1. Pneumonia: Community-acquired pneumonia, right lower lobe. Patient came into the hospital with the fever shortness of breath and cough. Chest x-ray showed right lower lobe pneumonia treated with Rocephin and azithromycin initially, patient also has been on mucolytics, inhaled bronchodilators and antitussives. She is on oxygen as needed initially she was hypoxic in the emergency department and she needed to 4 L of oxygen her saturation up above 90%. She improved and weaned off of the oxygen, she will be on 5 more days on Levaquin, prescription was given for Mucinex too.  2. Acute hypoxic respiratory failure: Patient oxygen saturation was 88% on room air in the emergency department, she is about 3-4 L  of oxygen to keep her saturation above 90%. This is probably multifactorial secondary to her pneumonia, asthma exacerbation. She also seems to have fluid overload and she will also might be affected by apnea/hypoxia syndrome. I will highly recommend for her to get sleep study as outpatient, she might benefit from CPAP at night. I discharged her on Lasix 40 mg as she was getting Lasix 40 mg twice a day in the hospital and it did seem to help. She has very low BNP so I did not check her for CHF.  3. Asthma: Asthmatic exacerbation with upper respiratory tract infection. She was started on mucolytics, inhaled bronchodilators and antitussives. Started on steroids. On the date of discharge steroids taper over 6 days.  4. Hypertension: Preadmission home medications were continued throughout the hospital stay.  Procedures:  None  Consultations:  None  Discharge Exam: Filed Vitals:   05/09/12 0511 05/09/12 0755 05/09/12 0900 05/09/12 1136  BP: 135/92     Pulse: 60     Temp: 97.7 F (36.5 C)     TempSrc: Oral     Resp: 20     Height:      Weight:      SpO2: 98% 97% 94% 93%   General: Alert and awake, oriented x3, not in any acute distress. HEENT: anicteric sclera, pupils reactive to light and accommodation, EOMI CVS: S1-S2 clear, no murmur rubs or gallops Chest: clear to auscultation bilaterally, no wheezing, rales or rhonchi Abdomen: soft nontender, nondistended, normal bowel sounds, no organomegaly Extremities: no cyanosis, clubbing or edema noted bilaterally Neuro: Cranial nerves II-XII intact, no focal neurological deficits  Discharge Instructions  Discharge Orders    Future Orders Please Complete By Expires   Diet - low sodium heart healthy      Increase activity slowly          Medication List     As of 05/09/2012 12:36 PM    STOP taking these medications         sulfamethoxazole-trimethoprim 800-160 MG per tablet   Commonly known as: BACTRIM DS,SEPTRA DS      TAKE  these medications         albuterol 108 (90 BASE) MCG/ACT inhaler   Commonly known as: PROVENTIL HFA;VENTOLIN HFA   Inhale 2 puffs into the lungs every 6 (six) hours as needed. Takes for shortness of breath      ALPRAZolam 0.5 MG tablet   Commonly known as: XANAX   Take 0.5 mg by mouth at bedtime as needed. Takes for depression      budesonide-formoterol 160-4.5 MCG/ACT inhaler   Commonly known as: SYMBICORT   Inhale 2 puffs into the lungs 2 (two) times daily.      cyclobenzaprine 10 MG tablet   Commonly known as: FLEXERIL   Take 10 mg by mouth 3 (three) times daily as needed. Takes for muscle spasms      furosemide 40 MG tablet   Commonly known as: LASIX   Take 1 tablet (40 mg total) by mouth daily.      guaiFENesin 600 MG 12 hr tablet   Commonly known as: MUCINEX   Take 2 tablets (1,200 mg total) by mouth 2 (two) times daily.      levofloxacin 750 MG tablet   Commonly known as: LEVAQUIN   Take 1 tablet (750 mg total) by mouth daily.      losartan 25 MG tablet   Commonly known as: COZAAR   Take 25 mg by mouth daily.      montelukast 10 MG tablet   Commonly known as: SINGULAIR   Take 10 mg by mouth at bedtime.      OMEPRAZOLE PO   Take 1 tablet by mouth daily.      oxyCODONE-acetaminophen 10-325 MG per tablet   Commonly known as: PERCOCET   Take 1 tablet by mouth every 4 (four) hours as needed. Takes for pain      oxymorphone 20 MG 12 hr tablet   Commonly known as: OPANA ER   Take 20 mg by mouth every 12 (twelve) hours.      predniSONE 10 MG tablet   Commonly known as: STERAPRED UNI-PAK   Take 6-5-4-3-2-1 tabs PO daily till gone      pregabalin 75 MG capsule   Commonly known as: LYRICA   Take 75 mg by mouth 3 (three) times daily.      VOLTAREN 1 % Gel   Generic drug: diclofenac sodium   Apply 2 g topically 4 (four) times daily.           Follow-up Information    Follow up with AVBUERE,EDWIN A, MD. In 1 week.   Contact information:   [address] [place] [address] [phone]           The results of significant diagnostics from this hospitalization (including imaging, microbiology, ancillary and laboratory) are listed below for reference.    Significant Diagnostic Studies: Dg Chest 2 View  05/08/2012  *RADIOLOGY REPORT*  Clinical Data: Cough, pneumonia  CHEST - 2 VIEW  Comparison: 05/05/2012  Findings: Mild patchy right lower lobe  opacity, suspicious for pneumonia.  Scarring in the lingula/left lower lobe. No pleural effusion or pneumothorax.  Cardiomediastinal silhouette is within normal limits.  Visualized osseous structures are within normal limits.  IMPRESSION: Mild patchy right lower lobe opacity, suspicious for pneumonia.   Original Report Authenticated By: Charline Bills, M.D.    Dg Chest 2 View  05/05/2012  *RADIOLOGY REPORT*  Clinical Data: Cough for 3 days  CHEST - 2 VIEW  Comparison: 02/11/2007  Findings: Normal lung volumes.  No effusions or edema.  Normal heart size.  Hazy lung opacities are identified in both lung bases. The review of the visualized osseous structures is unremarkable.  IMPRESSION:  1.  Hazy bilateral lung opacities within the lower lobes may represent infiltrates and/or atelectasis.   Original Report Authenticated By: Signa Kell, M.D.     Microbiology: Recent Results (from the past 240 hour(s))  URINE CULTURE     Status: Normal   Collection Time   05/05/12  8:20 PM      Component Value Range Status Comment   Specimen Description URINE, CLEAN CATCH   Final    Special Requests NONE   Final    Culture  Setup Time 05/06/2012 12:45   Final    Colony Count NO GROWTH   Final    Culture NO GROWTH   Final    Report Status 05/07/2012 FINAL   Final   CULTURE, BLOOD (ROUTINE X 2)     Status: Normal (Preliminary result)   Collection Time   05/05/12 10:56 PM      Component Value Range Status Comment   Specimen Description BLOOD RIGHT FOREARM   Final    Special Requests     Final    Value:  BOTTLES DRAWN AEROBIC ONLY 5CC PATIENT ON FOLLOWING ROCEPHIN   Culture  Setup Time 05/06/2012 12:09   Final    Culture     Final    Value:        BLOOD CULTURE RECEIVED NO GROWTH TO DATE CULTURE WILL BE HELD FOR 5 DAYS BEFORE ISSUING A FINAL NEGATIVE REPORT   Report Status PENDING   Incomplete   CULTURE, BLOOD (ROUTINE X 2)     Status: Normal (Preliminary result)   Collection Time   05/06/12  9:35 AM      Component Value Range Status Comment   Specimen Description BLOOD RIGHT HAND   Final    Special Requests BOTTLES DRAWN AEROBIC ONLY [facility]   Final    Culture  Setup Time 05/06/2012 13:21   Final    Culture     Final    Value:        BLOOD CULTURE RECEIVED NO GROWTH TO DATE CULTURE WILL BE HELD FOR 5 DAYS BEFORE ISSUING A FINAL NEGATIVE REPORT   Report Status PENDING   Incomplete   CULTURE, EXPECTORATED SPUTUM-ASSESSMENT     Status: Normal   Collection Time   05/07/12  1:00 AM      Component Value Range Status Comment   Specimen Description SPUTUM   Final    Special Requests NONE   Final    Sputum evaluation     Final    Value: MICROSCOPIC FINDINGS SUGGEST THAT THIS SPECIMEN IS NOT REPRESENTATIVE OF LOWER RESPIRATORY SECRETIONS. PLEASE RECOLLECT.     CALLED TO G.OLOAA,RN 0235 05/07/12 M.CAMPBELL   Report Status 05/07/2012 FINAL   Final      Labs: Basic Metabolic Panel:  Lab 05/09/12 1096 05/08/12 0924 05/07/12 0700 05/06/12 0800 05/05/12  1933  NA 134* 135 137 135 137  K 4.0 4.4 3.9 4.5 5.1  CL 94* 95* 101 101 99  CO2 29 23 23 21 24   GLUCOSE 103* 164* 92 125* 110*  BUN 16 15 18 11 11   CREATININE 0.73 0.65 0.82 0.67 1.00  CALCIUM 9.4 9.8 8.8 8.9 9.9  MG -- -- -- -- --  PHOS -- -- -- -- --   Liver Function Tests:  Lab 05/05/12 1933  AST 36  ALT 33  ALKPHOS 76  BILITOT 0.5  PROT 8.9*  ALBUMIN 3.8   No results found for this basename: LIPASE:5,AMYLASE:5 in the last 168 hours No results found for this basename: AMMONIA:5 in the last 168 hours CBC:  Lab 05/07/12 0700  05/06/12 0800 05/05/12 1933  WBC 11.7* 7.0 10.2  NEUTROABS -- -- 8.0*  HGB 11.3* 11.9* 13.4  HCT 35.5* 35.4* 39.9  MCV 87.7 84.9 86.2  PLT 431* 392 374   Cardiac Enzymes: No results found for this basename: CKTOTAL:5,CKMB:5,CKMBINDEX:5,TROPONINI:5 in the last 168 hours BNP: BNP (last 3 results)  Basename 05/07/12 0700  PROBNP 9.4   CBG: No results found for this basename: GLUCAP:5 in the last 168 hours     Signed:  Zahria Ding A  [facility] 05/09/2012, 12:36 PM

## 2012-05-09 NOTE — Care Management Note (Addendum)
    Page 1 of 1   05/09/2012     1:55:40 PM   CARE MANAGEMENT NOTE 05/09/2012  Patient:  Felicia Molina, Felicia Molina   Account Number:  [id number]  Date Initiated:  05/09/2012  Documentation initiated by:  Letha Cape  Subjective/Objective Assessment:   dx pna  admit- lives with spouse. pta independent.     Action/Plan:   Anticipated DC Date:  05/09/2012   Anticipated DC Plan:  HOME/SELF CARE      DC Planning Services  CM consult  MATCH Program      Choice offered to / List presented to:             Status of service:  Completed, signed off Medicare Important Message given?   (If response is "NO", the following Medicare IM given date fields will be blank) Date Medicare IM given:   Date Additional Medicare IM given:    Discharge Disposition:  HOME/SELF CARE  Per UR Regulation:    If discussed at Long Length of Stay Meetings, dates discussed:    Comments:  05/09/12 13:53 Letha Cape RN, BSN [phone] patient now states she needs help with medications, would like to use the [employer] , states when I spoke with her earlier she was sleepy but she does not have enough money to get meds.  05/09/12 10:31 Letha Cape RN, BSN [phone] patient lives with spouse, pta independent.  Patient has medication coverage and transportation, no needs anticipated.

## 2012-05-09 NOTE — Progress Notes (Signed)
NURSING PROGRESS NOTE  Felicia Molina [id number] Discharge Data: 05/09/2012 2:36 PM Attending Provider: Clydia Llano, MD WUJ:WJXBJYN,WGNFA A, MD     Tametria Doucet to be D/C'd Home per MD order.  Discussed with the patient the After Visit Summary and all questions fully answered. All IV's discontinued with no bleeding noted. All belongings returned to patient for patient to take home.   Last Vital Signs:  Blood pressure 135/92, pulse 60, temperature 97.7 F (36.5 C), temperature source Oral, resp. rate 20, height 5' (1.524 m), weight 128.3 kg (282 lb 13.6 oz), last menstrual period 02/05/2012, SpO2 93.00%.  Discharge Medication List   Medication List     As of 05/09/2012  2:36 PM    STOP taking these medications         sulfamethoxazole-trimethoprim 800-160 MG per tablet   Commonly known as: BACTRIM DS,SEPTRA DS      TAKE these medications         albuterol 108 (90 BASE) MCG/ACT inhaler   Commonly known as: PROVENTIL HFA;VENTOLIN HFA   Inhale 2 puffs into the lungs every 6 (six) hours as needed. Takes for shortness of breath      ALPRAZolam 0.5 MG tablet   Commonly known as: XANAX   Take 0.5 mg by mouth at bedtime as needed. Takes for depression      budesonide-formoterol 160-4.5 MCG/ACT inhaler   Commonly known as: SYMBICORT   Inhale 2 puffs into the lungs 2 (two) times daily.      cyclobenzaprine 10 MG tablet   Commonly known as: FLEXERIL   Take 10 mg by mouth 3 (three) times daily as needed. Takes for muscle spasms      furosemide 40 MG tablet   Commonly known as: LASIX   Take 1 tablet (40 mg total) by mouth daily.      guaiFENesin 600 MG 12 hr tablet   Commonly known as: MUCINEX   Take 2 tablets (1,200 mg total) by mouth 2 (two) times daily.      levofloxacin 750 MG tablet   Commonly known as: LEVAQUIN   Take 1 tablet (750 mg total) by mouth daily.      losartan 25 MG tablet   Commonly known as: COZAAR   Take 25 mg by mouth daily.      montelukast 10 MG  tablet   Commonly known as: SINGULAIR   Take 10 mg by mouth at bedtime.      OMEPRAZOLE PO   Take 1 tablet by mouth daily.      oxyCODONE-acetaminophen 10-325 MG per tablet   Commonly known as: PERCOCET   Take 1 tablet by mouth every 4 (four) hours as needed. Takes for pain      oxymorphone 20 MG 12 hr tablet   Commonly known as: OPANA ER   Take 20 mg by mouth every 12 (twelve) hours.      predniSONE 10 MG tablet   Commonly known as: STERAPRED UNI-PAK   Take 6-5-4-3-2-1 tabs PO daily till gone      pregabalin 75 MG capsule   Commonly known as: LYRICA   Take 75 mg by mouth 3 (three) times daily.      VOLTAREN 1 % Gel   Generic drug: diclofenac sodium   Apply 2 g topically 4 (four) times daily.        Tremaine Earwood, Elmarie Mainland, RN

## 2012-05-12 LAB — CULTURE, BLOOD (ROUTINE X 2)
Culture: NO GROWTH
Culture: NO GROWTH

## 2012-07-19 ENCOUNTER — Encounter: Payer: Self-pay | Admitting: Podiatry

## 2012-07-19 ENCOUNTER — Ambulatory Visit (INDEPENDENT_AMBULATORY_CARE_PROVIDER_SITE_OTHER): Payer: Self-pay | Admitting: Podiatry

## 2012-07-19 VITALS — BP 155/90 | HR 97 | Ht 60.0 in | Wt 300.0 lb

## 2012-07-19 DIAGNOSIS — M722 Plantar fascial fibromatosis: Secondary | ICD-10-CM | POA: Insufficient documentation

## 2012-07-19 DIAGNOSIS — M7750 Other enthesopathy of unspecified foot: Secondary | ICD-10-CM

## 2012-07-19 DIAGNOSIS — M7741 Metatarsalgia, right foot: Secondary | ICD-10-CM | POA: Insufficient documentation

## 2012-07-19 DIAGNOSIS — IMO0002 Reserved for concepts with insufficient information to code with codable children: Secondary | ICD-10-CM

## 2012-07-19 NOTE — Patient Instructions (Addendum)
Seen for painful feet. Findings reveal collapsing medial arch with displacing first metatarsal bone, which can cause heel pain and foot pain. At this time we advise to use Orthotic shoe inserts to support mid arch area. Must loose excess weight before considering corrective foot surgery. Return as needed.

## 2012-07-19 NOTE — Progress Notes (Signed)
SUBJECTIVE: 42 y.o. year old female presents for pain in both feet. Both feet hurts, burns, and pricking sensation on arches of the feet.  Stated that she was diagnosed for Plantar fasciitis 5 years ago and surgery was planned after failing to improve 4 rounds of Cortisone injection. She could not have foot surgery at that time because of her back problem that needed to have surgery. Patient now feels that she is ready to have her foot surgery to resolve her foot pain.  Patient is referred by Dr. Concepcion Elk.   Reviewed medication, allergies, medical and surgical histories.   REVIEW OF SYSTEMS:  Constitutional: negative for anorexia, fatigue, fevers, malaise, night sweats, sweats and weight loss. Wears back brace.  BMI 58.5.  OBJECTIVE: DERMATOLOGIC EXAMINATION: Skin Integrity: Thick hard callused skin plantar lateral surface bilateral due to everyday weight bearing.  No open lesion or abnormal skin lesions.   VASCULAR EXAMINATION OF LOWER LIMBS: Pedal pulses: All pedal pulses are palpable with normal pulsation.  No visible edema or erythema, no temperature change noted. hin normal bilateral.  NEUROLOGIC EXAMINATION OF THE LOWER LIMBS: Epicritic and tactile sensations grossly intact.   MUSCULOSKELETAL EXAMINATION: Rectus foot with mild degree of forefoot varus. No ankle equinus noted.  Normal ankle and subtalar joint motion. Excess rearfoot pronation with full weight bearing.   RADIOGRAPHIC FINDINGS: General overview: Negative of Osteopenia. Absence of soft tissue swelling. Absence of acute osseous or articular surfaces of forefoot or rearfoot.  Significant findings are elevated first ray with plantar calcaneal spur bilateral on AP view.   ASSESSMENT: Plantar fasciitis bilateral. Dorsal displacement of the first ray with lowering of medial longitudinal arch. 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 All these are aggravated by excess  body weight.  PLAN: Reviewed clinical findings and available treatment options. Discussed benefit of Orthotic shoe inserts, NSAIA, Pain medication. Discussed weight control prior to considering surgical intervention.

## 2012-12-28 ENCOUNTER — Encounter (HOSPITAL_BASED_OUTPATIENT_CLINIC_OR_DEPARTMENT_OTHER): Payer: Self-pay | Admitting: *Deleted

## 2012-12-28 ENCOUNTER — Emergency Department (HOSPITAL_BASED_OUTPATIENT_CLINIC_OR_DEPARTMENT_OTHER): Payer: Medicaid Other

## 2012-12-28 ENCOUNTER — Emergency Department (HOSPITAL_BASED_OUTPATIENT_CLINIC_OR_DEPARTMENT_OTHER)
Admission: EM | Admit: 2012-12-28 | Discharge: 2012-12-28 | Discharge status: Against Medical Advice | Payer: Medicaid Other | Attending: Emergency Medicine | Admitting: Emergency Medicine

## 2012-12-28 DIAGNOSIS — Z8669 Personal history of other diseases of the nervous system and sense organs: Secondary | ICD-10-CM | POA: Insufficient documentation

## 2012-12-28 DIAGNOSIS — Z87891 Personal history of nicotine dependence: Secondary | ICD-10-CM | POA: Insufficient documentation

## 2012-12-28 DIAGNOSIS — F410 Panic disorder [episodic paroxysmal anxiety] without agoraphobia: Secondary | ICD-10-CM | POA: Insufficient documentation

## 2012-12-28 DIAGNOSIS — IMO0001 Reserved for inherently not codable concepts without codable children: Secondary | ICD-10-CM | POA: Insufficient documentation

## 2012-12-28 DIAGNOSIS — Z791 Long term (current) use of non-steroidal anti-inflammatories (NSAID): Secondary | ICD-10-CM | POA: Insufficient documentation

## 2012-12-28 DIAGNOSIS — G8929 Other chronic pain: Secondary | ICD-10-CM | POA: Insufficient documentation

## 2012-12-28 DIAGNOSIS — M549 Dorsalgia, unspecified: Secondary | ICD-10-CM | POA: Insufficient documentation

## 2012-12-28 DIAGNOSIS — Z79899 Other long term (current) drug therapy: Secondary | ICD-10-CM | POA: Insufficient documentation

## 2012-12-28 DIAGNOSIS — R059 Cough, unspecified: Secondary | ICD-10-CM | POA: Insufficient documentation

## 2012-12-28 DIAGNOSIS — I10 Essential (primary) hypertension: Secondary | ICD-10-CM | POA: Insufficient documentation

## 2012-12-28 DIAGNOSIS — IMO0002 Reserved for concepts with insufficient information to code with codable children: Secondary | ICD-10-CM | POA: Insufficient documentation

## 2012-12-28 DIAGNOSIS — K219 Gastro-esophageal reflux disease without esophagitis: Secondary | ICD-10-CM | POA: Insufficient documentation

## 2012-12-28 DIAGNOSIS — R05 Cough: Secondary | ICD-10-CM

## 2012-12-28 DIAGNOSIS — J45909 Unspecified asthma, uncomplicated: Secondary | ICD-10-CM | POA: Insufficient documentation

## 2012-12-28 HISTORY — DX: Dorsalgia, unspecified: M54.9

## 2012-12-28 HISTORY — DX: Other chronic pain: G89.29

## 2012-12-28 HISTORY — DX: Neuromuscular dysfunction of bladder, unspecified: N31.9

## 2012-12-28 HISTORY — DX: Reserved for inherently not codable concepts without codable children: IMO0001

## 2012-12-28 HISTORY — DX: Panic disorder (episodic paroxysmal anxiety): F41.0

## 2012-12-28 HISTORY — DX: Gastro-esophageal reflux disease without esophagitis: K21.9

## 2012-12-28 LAB — URINALYSIS, ROUTINE W REFLEX MICROSCOPIC
Bilirubin Urine: NEGATIVE
Nitrite: NEGATIVE
Specific Gravity, Urine: 1.024 (ref 1.005–1.030)
Urobilinogen, UA: 0.2 mg/dL (ref 0.0–1.0)

## 2012-12-28 NOTE — ED Notes (Signed)
Patient states she has a two day history of generalized body aches with chills, sweating and cough.  States she feels like she has the flu.  Hx of pneumonia.

## 2012-12-28 NOTE — ED Provider Notes (Signed)
CSN: [id number]     Arrival date & time 12/28/12  1126 History   First MD Initiated Contact with Patient 12/28/12 1206     Chief Complaint  Patient presents with  . Generalized Body Aches   (Consider location/radiation/quality/duration/timing/severity/associated sxs/prior Treatment) Patient is a 42 y.o. female presenting with cough. The history is provided by the patient. No language interpreter was used.  Cough Cough characteristics:  Productive Severity:  Moderate Duration:  1 week Timing:  Constant Chronicity:  New Smoker: yes   Worsened by:  Nothing tried Ineffective treatments:  None tried Pt saw Dr. Concepcion Elk yesterday.  Diagnosed with a virus.   Pt reports she is worried that she has the flu  Past Medical History  Diagnosis Date  . Asthma   . Hypertension   . Fibromyalgia   . Neurogenic bladder   . Back pain, chronic   . Panic attacks   . Reflux    Past Surgical History  Procedure Laterality Date  . Back surgery    . Carpal tunnel release    . Ureteral stent placement  2008  . Bladder stimulator     No family history on file. History  Substance Use Topics  . Smoking status: Former Games developer  . Smokeless tobacco: Never Used     Comment: quit 2004  . Alcohol Use: No   OB History   Grav Para Term Preterm Abortions TAB SAB Ect Mult Living   6 4 4  0 2 0 2 0 0 4     Review of Systems  Respiratory: Positive for cough.   All other systems reviewed and are negative.    Allergies  Clindamycin/lincomycin and Ibuprofen  Home Medications   Current Outpatient Rx  Name  Route  Sig  Dispense  Refill  . albuterol (PROVENTIL HFA;VENTOLIN HFA) 108 (90 BASE) MCG/ACT inhaler   Inhalation   Inhale 2 puffs into the lungs every 6 (six) hours as needed. Takes for shortness of breath         . ALPRAZolam (XANAX) 0.5 MG tablet   Oral   Take 0.5 mg by mouth at bedtime as needed. Takes for depression         . budesonide-formoterol (SYMBICORT) 160-4.5 MCG/ACT inhaler  Inhalation   Inhale 2 puffs into the lungs 2 (two) times daily.         . cyclobenzaprine (FLEXERIL) 10 MG tablet   Oral   Take 10 mg by mouth 3 (three) times daily as needed. Takes for muscle spasms         . diclofenac sodium (VOLTAREN) 1 % GEL   Topical   Apply 2 g topically 4 (four) times daily.         . furosemide (LASIX) 40 MG tablet   Oral   Take 1 tablet (40 mg total) by mouth daily.   30 tablet   0   . guaiFENesin (MUCINEX) 600 MG 12 hr tablet   Oral   Take 2 tablets (1,200 mg total) by mouth 2 (two) times daily.   28 tablet   0   . losartan (COZAAR) 100 MG tablet   Oral   Take 100 mg by mouth daily.         Marland Kitchen losartan (COZAAR) 25 MG tablet   Oral   Take 25 mg by mouth daily.         . montelukast (SINGULAIR) 10 MG tablet   Oral   Take 10 mg by mouth at bedtime.         Marland Kitchen  OMEPRAZOLE PO   Oral   Take 1 tablet by mouth daily.         Marland Kitchen oxyCODONE-acetaminophen (PERCOCET) 10-325 MG per tablet   Oral   Take 1 tablet by mouth every 4 (four) hours as needed. Takes for pain         . oxymorphone (OPANA ER) 20 MG 12 hr tablet   Oral   Take 20 mg by mouth every 12 (twelve) hours.         . predniSONE (STERAPRED UNI-PAK) 10 MG tablet      Take 6-5-4-3-2-1 tabs PO daily till gone   21 tablet   0   . pregabalin (LYRICA) 75 MG capsule   Oral   Take 75 mg by mouth 3 (three) times daily.          BP 176/81  Pulse 102  Temp(Src) 98.1 F (36.7 C) (Oral)  Resp 20  SpO2 100%  LMP 02/05/2012 Physical Exam  Vitals reviewed. Constitutional: She appears well-developed and well-nourished.  HENT:  Head: Normocephalic.  Right Ear: External ear normal.  Left Ear: External ear normal.  Eyes: Conjunctivae and EOM are normal. Pupils are equal, round, and reactive to light.  Neck: Normal range of motion. Neck supple.  Cardiovascular: Normal rate and normal heart sounds.   Pulmonary/Chest: Effort normal.  Abdominal: Soft.  Musculoskeletal:  Normal range of motion.  Neurological: She is alert.  Skin: Skin is warm.  Pt left ama   ED Course  Procedures (including critical care time) Labs Review Labs Reviewed  URINALYSIS, ROUTINE W REFLEX MICROSCOPIC   Imaging Review No results found.  MDM  No diagnosis found. Pt left before xray    Elson Areas, PA-C 12/28/12 1304

## 2012-12-28 NOTE — ED Provider Notes (Signed)
Medical screening examination/treatment/procedure(s) were performed by non-physician practitioner and as supervising physician I was immediately available for consultation/collaboration.  Derwood Kaplan, MD 12/28/12 1649

## 2013-01-29 ENCOUNTER — Other Ambulatory Visit: Payer: Self-pay | Admitting: Internal Medicine

## 2013-01-29 DIAGNOSIS — IMO0002 Reserved for concepts with insufficient information to code with codable children: Secondary | ICD-10-CM

## 2013-02-01 ENCOUNTER — Ambulatory Visit
Admission: RE | Admit: 2013-02-01 | Discharge: 2013-02-01 | Disposition: A | Discharge status: Home / Self Care | Payer: Medicaid Other | Source: Ambulatory Visit | Attending: Internal Medicine | Admitting: Internal Medicine

## 2013-02-01 ENCOUNTER — Other Ambulatory Visit: Payer: Medicaid Other

## 2013-02-01 DIAGNOSIS — IMO0002 Reserved for concepts with insufficient information to code with codable children: Secondary | ICD-10-CM

## 2013-02-19 ENCOUNTER — Ambulatory Visit (HOSPITAL_BASED_OUTPATIENT_CLINIC_OR_DEPARTMENT_OTHER): Payer: Medicaid Other | Attending: Anesthesiology

## 2013-02-25 ENCOUNTER — Ambulatory Visit (HOSPITAL_BASED_OUTPATIENT_CLINIC_OR_DEPARTMENT_OTHER): Payer: Medicaid Other | Attending: Anesthesiology

## 2013-03-04 ENCOUNTER — Emergency Department (HOSPITAL_COMMUNITY)
Admission: EM | Admit: 2013-03-04 | Discharge: 2013-03-04 | Disposition: A | Discharge status: Home / Self Care | Payer: Medicaid Other | Source: Home / Self Care | Attending: Family Medicine | Admitting: Family Medicine

## 2013-03-04 ENCOUNTER — Encounter (HOSPITAL_COMMUNITY): Payer: Self-pay | Admitting: Emergency Medicine

## 2013-03-04 ENCOUNTER — Emergency Department (INDEPENDENT_AMBULATORY_CARE_PROVIDER_SITE_OTHER): Payer: Medicaid Other

## 2013-03-04 DIAGNOSIS — J069 Acute upper respiratory infection, unspecified: Secondary | ICD-10-CM

## 2013-03-04 DIAGNOSIS — J189 Pneumonia, unspecified organism: Secondary | ICD-10-CM

## 2013-03-04 MED ORDER — DEXTROMETHORPHAN POLISTIREX 30 MG/5ML PO LQCR: 60.0000 mg | Freq: Two times a day (BID) | ORAL | Status: DC

## 2013-03-04 MED ORDER — IPRATROPIUM BROMIDE 0.06 % NA SOLN: 2.0000 | Freq: Four times a day (QID) | NASAL | Status: AC

## 2013-03-04 NOTE — ED Notes (Signed)
42 yr old is here with complaints of 1- SOB; chest congestion; body aches;sinus press;HA x 2 dys; She states she has tried nebulizer tx at home with no improvement. 2- Frequent burning urination x 2 dys   No other compliants

## 2013-03-04 NOTE — ED Provider Notes (Signed)
CSN: [id number]     Arrival date & time 03/04/13  1706 History   First MD Initiated Contact with Patient 03/04/13 1820     Chief Complaint  Patient presents with  . URI  . Urinary Tract Infection   (Consider location/radiation/quality/duration/timing/severity/associated sxs/prior Treatment) Patient is a 42 y.o. female presenting with cough. The history is provided by the patient.  Cough Cough characteristics:  Non-productive Severity:  Moderate Onset quality:  Gradual Duration:  3 days Progression:  Unchanged Chronicity:  New Smoker: no   Context: upper respiratory infection and weather changes   Ineffective treatments:  Home nebulizer and beta-agonist inhaler Associated symptoms: chest pain, rhinorrhea, shortness of breath and sinus congestion   Associated symptoms: no fever and no wheezing     Past Medical History  Diagnosis Date  . Asthma   . Hypertension   . Fibromyalgia   . Neurogenic bladder   . Back pain, chronic   . Panic attacks   . Reflux    Past Surgical History  Procedure Laterality Date  . Back surgery    . Carpal tunnel release    . Ureteral stent placement  2008  . Bladder stimulator     No family history on file. History  Substance Use Topics  . Smoking status: Former Games developer  . Smokeless tobacco: Never Used     Comment: quit 2004  . Alcohol Use: No   OB History   Grav Para Term Preterm Abortions TAB SAB Ect Mult Living   6 4 4  0 2 0 2 0 0 4     Review of Systems  Constitutional: Negative.  Negative for fever.  HENT: Positive for congestion, rhinorrhea and sinus pressure.   Respiratory: Positive for cough and shortness of breath. Negative for wheezing.   Cardiovascular: Positive for chest pain.    Allergies  Clindamycin/lincomycin and Ibuprofen  Home Medications   Current Outpatient Rx  Name  Route  Sig  Dispense  Refill  . albuterol (PROVENTIL HFA;VENTOLIN HFA) 108 (90 BASE) MCG/ACT inhaler   Inhalation   Inhale 2 puffs into the  lungs every 6 (six) hours as needed. Takes for shortness of breath         . ALPRAZolam (XANAX) 0.5 MG tablet   Oral   Take 0.5 mg by mouth at bedtime as needed. Takes for depression         . cyclobenzaprine (FLEXERIL) 10 MG tablet   Oral   Take 10 mg by mouth 3 (three) times daily as needed. Takes for muscle spasms         . losartan (COZAAR) 100 MG tablet   Oral   Take 100 mg by mouth daily.         Marland Kitchen OMEPRAZOLE PO   Oral   Take 1 tablet by mouth daily.         Marland Kitchen oxymorphone (OPANA ER) 20 MG 12 hr tablet   Oral   Take 20 mg by mouth every 12 (twelve) hours.         . pregabalin (LYRICA) 75 MG capsule   Oral   Take 75 mg by mouth 3 (three) times daily.         . budesonide-formoterol (SYMBICORT) 160-4.5 MCG/ACT inhaler   Inhalation   Inhale 2 puffs into the lungs 2 (two) times daily.         Marland Kitchen dextromethorphan (DELSYM) 30 MG/5ML liquid   Oral   Take 10 mLs (60 mg total) by mouth 2 (  two) times daily.   89 mL   0   . diclofenac sodium (VOLTAREN) 1 % GEL   Topical   Apply 2 g topically 4 (four) times daily.         . furosemide (LASIX) 40 MG tablet   Oral   Take 1 tablet (40 mg total) by mouth daily.   30 tablet   0   . guaiFENesin (MUCINEX) 600 MG 12 hr tablet   Oral   Take 2 tablets (1,200 mg total) by mouth 2 (two) times daily.   28 tablet   0   . ipratropium (ATROVENT) 0.06 % nasal spray   Nasal   Place 2 sprays into the nose 4 (four) times daily.   15 mL   1   . losartan (COZAAR) 25 MG tablet   Oral   Take 25 mg by mouth daily.         . montelukast (SINGULAIR) 10 MG tablet   Oral   Take 10 mg by mouth at bedtime.         Marland Kitchen oxyCODONE-acetaminophen (PERCOCET) 10-325 MG per tablet   Oral   Take 1 tablet by mouth every 4 (four) hours as needed. Takes for pain         . predniSONE (STERAPRED UNI-PAK) 10 MG tablet      Take 6-5-4-3-2-1 tabs PO daily till gone   21 tablet   0    BP 136/76  Pulse 86  Temp(Src) 98.1 F  (36.7 C) (Oral)  Resp 18  SpO2 100%  LMP 02/05/2012 Physical Exam  Nursing note and vitals reviewed. Constitutional: She is oriented to person, place, and time. She appears well-developed and well-nourished.  HENT:  Head: Normocephalic.  Right Ear: External ear normal.  Left Ear: External ear normal.  Mouth/Throat: Oropharynx is clear and moist.  Eyes: Conjunctivae are normal. Pupils are equal, round, and reactive to light.  Neck: Normal range of motion. Neck supple.  Cardiovascular: Normal rate, regular rhythm, normal heart sounds and intact distal pulses.   Pulmonary/Chest: Effort normal and breath sounds normal.  Lymphadenopathy:    She has no cervical adenopathy.  Neurological: She is alert and oriented to person, place, and time.  Skin: Skin is warm and dry.    ED Course  Procedures (including critical care time) Labs Review Labs Reviewed - No data to display Imaging Review Dg Chest 2 View  03/04/2013   CLINICAL DATA:  Cough for 2 days  EXAM: CHEST  2 VIEW  COMPARISON:  None.  FINDINGS: The heart size and mediastinal contours are within normal limits. Both lungs are clear. The visualized skeletal structures are unremarkable.  IMPRESSION: No active cardiopulmonary disease.   Electronically Signed   By: Esperanza Heir M.D.   On: 03/04/2013 18:42    EKG Interpretation     Ventricular Rate:    PR Interval:    QRS Duration:   QT Interval:    QTC Calculation:   R Axis:     Text Interpretation:              MDM  X-rays reviewed and report per radiologist.     Linna Hoff, MD 03/04/13 [phone]

## 2013-05-11 ENCOUNTER — Encounter (HOSPITAL_BASED_OUTPATIENT_CLINIC_OR_DEPARTMENT_OTHER): Payer: Medicaid Other

## 2013-05-14 ENCOUNTER — Encounter (HOSPITAL_BASED_OUTPATIENT_CLINIC_OR_DEPARTMENT_OTHER): Payer: Medicaid Other

## 2013-05-19 ENCOUNTER — Encounter (HOSPITAL_BASED_OUTPATIENT_CLINIC_OR_DEPARTMENT_OTHER): Payer: Self-pay | Admitting: Emergency Medicine

## 2013-05-19 ENCOUNTER — Emergency Department (HOSPITAL_BASED_OUTPATIENT_CLINIC_OR_DEPARTMENT_OTHER)
Admission: EM | Admit: 2013-05-19 | Discharge: 2013-05-19 | Disposition: A | Discharge status: Home / Self Care | Payer: Medicaid Other | Attending: Emergency Medicine | Admitting: Emergency Medicine

## 2013-05-19 ENCOUNTER — Emergency Department (HOSPITAL_BASED_OUTPATIENT_CLINIC_OR_DEPARTMENT_OTHER): Payer: Medicaid Other

## 2013-05-19 DIAGNOSIS — K219 Gastro-esophageal reflux disease without esophagitis: Secondary | ICD-10-CM | POA: Insufficient documentation

## 2013-05-19 DIAGNOSIS — I10 Essential (primary) hypertension: Secondary | ICD-10-CM | POA: Insufficient documentation

## 2013-05-19 DIAGNOSIS — Z79899 Other long term (current) drug therapy: Secondary | ICD-10-CM | POA: Insufficient documentation

## 2013-05-19 DIAGNOSIS — R6889 Other general symptoms and signs: Secondary | ICD-10-CM

## 2013-05-19 DIAGNOSIS — IMO0001 Reserved for inherently not codable concepts without codable children: Secondary | ICD-10-CM | POA: Insufficient documentation

## 2013-05-19 DIAGNOSIS — J111 Influenza due to unidentified influenza virus with other respiratory manifestations: Secondary | ICD-10-CM | POA: Insufficient documentation

## 2013-05-19 DIAGNOSIS — J45909 Unspecified asthma, uncomplicated: Secondary | ICD-10-CM | POA: Insufficient documentation

## 2013-05-19 DIAGNOSIS — IMO0002 Reserved for concepts with insufficient information to code with codable children: Secondary | ICD-10-CM | POA: Insufficient documentation

## 2013-05-19 DIAGNOSIS — Z87891 Personal history of nicotine dependence: Secondary | ICD-10-CM | POA: Insufficient documentation

## 2013-05-19 DIAGNOSIS — F410 Panic disorder [episodic paroxysmal anxiety] without agoraphobia: Secondary | ICD-10-CM | POA: Insufficient documentation

## 2013-05-19 DIAGNOSIS — Z87448 Personal history of other diseases of urinary system: Secondary | ICD-10-CM | POA: Insufficient documentation

## 2013-05-19 DIAGNOSIS — G8929 Other chronic pain: Secondary | ICD-10-CM | POA: Insufficient documentation

## 2013-05-19 MED ORDER — PHENYLEPH-PROMETHAZINE-COD 5-6.25-10 MG/5ML PO SYRP: 5.0000 mL | ORAL_SOLUTION | Freq: Four times a day (QID) | ORAL | Status: DC | PRN

## 2013-05-19 NOTE — ED Provider Notes (Signed)
CSN: [id number]     Arrival date & time 05/19/13  1642 History   First MD Initiated Contact with Patient 05/19/13 1658     Chief Complaint  Patient presents with  . Cough   (Consider location/radiation/quality/duration/timing/severity/associated sxs/prior Treatment) Patient is a 43 y.o. female presenting with URI. The history is provided by the patient.  URI Presenting symptoms: congestion and cough   Presenting symptoms: no fever   Severity:  Moderate Onset quality:  Gradual Duration:  3 days Timing:  Sporadic Progression:  Worsening Chronicity:  New Relieved by:  Nothing Ineffective treatments:  OTC medications Associated symptoms: myalgias   Associated symptoms: no headaches, no sinus pain and no wheezing    Patient with cough and congestion past few days. No wheezing.  Past Medical History  Diagnosis Date  . Asthma   . Hypertension   . Fibromyalgia   . Neurogenic bladder   . Back pain, chronic   . Panic attacks   . Reflux    Past Surgical History  Procedure Laterality Date  . Back surgery    . Carpal tunnel release    . Ureteral stent placement  2008  . Bladder stimulator     No family history on file. History  Substance Use Topics  . Smoking status: Former Research scientist (life sciences)  . Smokeless tobacco: Never Used     Comment: quit 2004  . Alcohol Use: No   OB History   Grav Para Term Preterm Abortions TAB SAB Ect Mult Living   6 4 4  0 2 0 2 0 0 4     Review of Systems  Constitutional: Positive for chills. Negative for fever.  HENT: Positive for congestion.   Eyes: Negative for visual disturbance.  Respiratory: Positive for cough. Negative for wheezing.   Gastrointestinal: Negative for nausea, vomiting and abdominal pain.  Genitourinary: Negative for dysuria and frequency.  Musculoskeletal: Positive for myalgias.  Skin: Negative for rash.  Neurological: Negative for syncope and headaches.  Psychiatric/Behavioral: Negative for confusion. The patient is not  nervous/anxious.     Allergies  Clindamycin/lincomycin and Ibuprofen  Home Medications   Current Outpatient Rx  Name  Route  Sig  Dispense  Refill  . albuterol (PROVENTIL HFA;VENTOLIN HFA) 108 (90 BASE) MCG/ACT inhaler   Inhalation   Inhale 2 puffs into the lungs every 6 (six) hours as needed. Takes for shortness of breath         . ALPRAZolam (XANAX) 0.5 MG tablet   Oral   Take 0.5 mg by mouth at bedtime as needed. Takes for depression         . budesonide-formoterol (SYMBICORT) 160-4.5 MCG/ACT inhaler   Inhalation   Inhale 2 puffs into the lungs 2 (two) times daily.         . cyclobenzaprine (FLEXERIL) 10 MG tablet   Oral   Take 10 mg by mouth 3 (three) times daily as needed. Takes for muscle spasms         . dextromethorphan (DELSYM) 30 MG/5ML liquid   Oral   Take 10 mLs (60 mg total) by mouth 2 (two) times daily.   89 mL   0   . diclofenac sodium (VOLTAREN) 1 % GEL   Topical   Apply 2 g topically 4 (four) times daily.         . furosemide (LASIX) 40 MG tablet   Oral   Take 1 tablet (40 mg total) by mouth daily.   30 tablet   0   .  guaiFENesin (MUCINEX) 600 MG 12 hr tablet   Oral   Take 2 tablets (1,200 mg total) by mouth 2 (two) times daily.   28 tablet   0   . ipratropium (ATROVENT) 0.06 % nasal spray   Nasal   Place 2 sprays into the nose 4 (four) times daily.   15 mL   1   . losartan (COZAAR) 100 MG tablet   Oral   Take 100 mg by mouth daily.         Marland Kitchen losartan (COZAAR) 25 MG tablet   Oral   Take 25 mg by mouth daily.         . montelukast (SINGULAIR) 10 MG tablet   Oral   Take 10 mg by mouth at bedtime.         . OMEPRAZOLE PO   Oral   Take 1 tablet by mouth daily.         Marland Kitchen oxyCODONE-acetaminophen (PERCOCET) 10-325 MG per tablet   Oral   Take 1 tablet by mouth every 4 (four) hours as needed. Takes for pain         . oxymorphone (OPANA ER) 20 MG 12 hr tablet   Oral   Take 20 mg by mouth every 12 (twelve) hours.          . predniSONE (STERAPRED UNI-PAK) 10 MG tablet      Take 6-5-4-3-2-1 tabs PO daily till gone   21 tablet   0   . pregabalin (LYRICA) 75 MG capsule   Oral   Take 75 mg by mouth 3 (three) times daily.          BP 139/83  Pulse 96  Temp(Src) 98.7 F (37.1 C) (Oral)  Resp 18  Ht 5' (1.524 m)  Wt 297 lb (134.718 kg)  BMI 58.00 kg/m2  SpO2 99%  LMP 02/05/2012 Physical Exam  Nursing note and vitals reviewed. Constitutional: She is oriented to person, place, and time. She appears well-developed and well-nourished. No distress.  HENT:  Head: Atraumatic.  Eyes: Conjunctivae and EOM are normal.  Neck: Normal range of motion. Neck supple.  Cardiovascular: Normal rate and regular rhythm.   Pulmonary/Chest: Effort normal. She has no wheezes. She has no rales.  Abdominal: Soft. There is no tenderness.  Musculoskeletal: Normal range of motion.  Neurological: She is alert and oriented to person, place, and time. No cranial nerve deficit.  Skin: Skin is warm and dry.  Psychiatric: She has a normal mood and affect. Her behavior is normal.    ED Course  Procedures (including critical care time) Labs Review Labs Reviewed - No data to display Imaging Review Dg Chest 2 View  05/19/2013   CLINICAL DATA:  Cough and congestion  EXAM: CHEST  2 VIEW  COMPARISON:  03/04/2013  FINDINGS: The heart size appears mildly enlarged, unchanged, with central vascular prominence but no other evidence for edema. Both lungs are clear. The visualized skeletal structures are unremarkable.  IMPRESSION: Mild cardiomegaly with central vascular congestion but no acute focal finding.   Electronically Signed   By: Conchita Paris M.D.   On: 05/19/2013 17:35   MDM SUBJECTIVE:  Felicia Molina is a 43 y.o. female who present complaining of flu-like symptoms: fevers, chills, myalgias, congestion, sore throat and cough for 3 days. Denies dyspnea or wheezing.  OBJECTIVE: Appears moderately ill but not toxic;  temperature as noted in vitals. Ears normal. Throat and pharynx normal.  Neck supple. No adenopathy in the neck. Sinuses  non tender. The chest is clear.  ASSESSMENT: Influenza  PLAN: Symptomatic therapy suggested: rest, increase fluids, gargle prn for sore throat, use mist of vaporizer prn and return prn if symptoms persist or worsen. Return to the ED if these symptoms worsen or fail to improve as anticipated.    Medication List    TAKE these medications       Phenyleph-Promethazine-Cod 5-6.25-10 MG/5ML Syrp  Take 5 mLs by mouth every 6 (six) hours as needed.      ASK your doctor about these medications       albuterol 108 (90 BASE) MCG/ACT inhaler  Commonly known as:  PROVENTIL HFA;VENTOLIN HFA  Inhale 2 puffs into the lungs every 6 (six) hours as needed. Takes for shortness of breath     ALPRAZolam 0.5 MG tablet  Commonly known as:  XANAX  Take 0.5 mg by mouth at bedtime as needed. Takes for depression     budesonide-formoterol 160-4.5 MCG/ACT inhaler  Commonly known as:  SYMBICORT  Inhale 2 puffs into the lungs 2 (two) times daily.     cyclobenzaprine 10 MG tablet  Commonly known as:  FLEXERIL  Take 10 mg by mouth 3 (three) times daily as needed. Takes for muscle spasms     dextromethorphan 30 MG/5ML liquid  Commonly known as:  DELSYM  Take 10 mLs (60 mg total) by mouth 2 (two) times daily.     furosemide 40 MG tablet  Commonly known as:  LASIX  Take 1 tablet (40 mg total) by mouth daily.     guaiFENesin 600 MG 12 hr tablet  Commonly known as:  MUCINEX  Take 2 tablets (1,200 mg total) by mouth 2 (two) times daily.     ipratropium 0.06 % nasal spray  Commonly known as:  ATROVENT  Place 2 sprays into the nose 4 (four) times daily.     losartan 25 MG tablet  Commonly known as:  COZAAR  Take 25 mg by mouth daily.     losartan 100 MG tablet  Commonly known as:  COZAAR  Take 100 mg by mouth daily.     montelukast 10 MG tablet  Commonly known as:  SINGULAIR   Take 10 mg by mouth at bedtime.     OMEPRAZOLE PO  Take 1 tablet by mouth daily.     oxyCODONE-acetaminophen 10-325 MG per tablet  Commonly known as:  PERCOCET  Take 1 tablet by mouth every 4 (four) hours as needed. Takes for pain     oxymorphone 20 MG 12 hr tablet  Commonly known as:  OPANA ER  Take 20 mg by mouth every 12 (twelve) hours.     predniSONE 10 MG tablet  Commonly known as:  STERAPRED UNI-PAK  Take 6-5-4-3-2-1 tabs PO daily till gone     pregabalin 75 MG capsule  Commonly known as:  LYRICA  Take 75 mg by mouth 3 (three) times daily.     VOLTAREN 1 % Gel  Generic drug:  diclofenac sodium  Apply 2 g topically 4 (four) times daily.           [place], [place] 05/20/13 [phone]

## 2013-05-19 NOTE — Discharge Instructions (Signed)
Your chest x-ray shows no pneumonia. We are treating your flu like symptoms. Follow up with your doctor. Return here as needed.

## 2013-05-19 NOTE — ED Notes (Signed)
Upper respiratory congestion, cough, congestion, chest burning with breathing.  Denies fever.  Taking ThermaFlu with no relief.

## 2013-05-20 NOTE — ED Provider Notes (Signed)
Medical screening examination/treatment/procedure(s) were performed by non-physician practitioner and as supervising physician I was immediately available for consultation/collaboration.  EKG Interpretation   None         Mervin Kung, MD 05/20/13 [phone]

## 2013-06-07 ENCOUNTER — Ambulatory Visit (HOSPITAL_BASED_OUTPATIENT_CLINIC_OR_DEPARTMENT_OTHER): Payer: Medicaid Other | Attending: Anesthesiology | Admitting: Radiology

## 2013-06-07 VITALS — Ht 60.0 in | Wt 302.0 lb

## 2013-06-07 DIAGNOSIS — G4730 Sleep apnea, unspecified: Principal | ICD-10-CM

## 2013-06-07 DIAGNOSIS — R05 Cough: Secondary | ICD-10-CM | POA: Insufficient documentation

## 2013-06-07 DIAGNOSIS — G4710 Hypersomnia, unspecified: Secondary | ICD-10-CM | POA: Insufficient documentation

## 2013-06-07 DIAGNOSIS — G4733 Obstructive sleep apnea (adult) (pediatric): Secondary | ICD-10-CM

## 2013-06-07 DIAGNOSIS — R059 Cough, unspecified: Secondary | ICD-10-CM | POA: Insufficient documentation

## 2013-06-09 DIAGNOSIS — G4733 Obstructive sleep apnea (adult) (pediatric): Secondary | ICD-10-CM

## 2013-06-09 NOTE — Sleep Study (Signed)
   NAME: Felicia Molina DATE OF BIRTH:  1970-11-21 MEDICAL RECORD NUMBER [id number]  LOCATION: [facility]  PHYSICIAN: Ambrosio Reuter D  DATE OF STUDY: 06/07/2013  SLEEP STUDY TYPE: Nocturnal Polysomnogram               REFERRING PHYSICIAN: Shanon Ace,*  INDICATION FOR STUDY: Hypersomnia with sleep apnea  EPWORTH SLEEPINESS SCORE:   23/24 HEIGHT: 5' (152.4 cm)  WEIGHT: 302 lb (136.986 kg)    Body mass index is 58.98 kg/(m^2).  NECK SIZE: 17 in.  MEDICATIONS: Charted for review  SLEEP ARCHITECTURE: Total sleep time 285 minutes with sleep efficiency 69.4%. Stage I 14.9%, stage II 71.8%, stage III absent, REM 13.3% of total sleep time. Sleep latency 46 minutes, REM latency 64.5 minutes, awake after sleep onset 79.5 minutes, arousal index 17.1. Bedtime medication: Percocet, Opana ER, Flexeril, Zyrtec, losartan, Lyrica, Lasix, Singulair, Symbicort, nasal spray, Prilosec  RESPIRATORY DATA: Apnea hypopnea index (AHI) 2.3 per hour. 11 events were scored including one obstructive apnea and 10 hypopneas. All events were associated with nonsupine sleep. REM AHI 14.2. CPAP titration was not done.  OXYGEN DATA: Moderately loud snoring with oxygen desaturation to a nadir of 80% and mean oxygen saturation to the study of 92.2% on room air.  CARDIAC DATA: Sinus rhythm with rare PVC and PAC  MOVEMENT/PARASOMNIA: No significant motor disturbance. Bathroom x3. Patient was noted to cough frequently throughout the study.  IMPRESSION/ RECOMMENDATION:   1) Sleep architecture was associated with frequent brief spontaneous wakings through most of the night. Note medication list recorded above. 2) Occasional respiratory event with sleep disturbance, within normal limits. AHI 2.3 per hour (the normal range for adults is an AHI from 0-5 events per hour). Moderately loud snoring with oxygen desaturation to a nadir of 80% and mean oxygen saturation to the study of 92.2% on room  air. 3) The technician reported patient coughing frequently throughout the night. The patient reported history of waking up choking. Consider the possibility that sleep is being disturbed by nocturnal reflux.   Signed Baird Lyons M.D. Deneise Lever Diplomate, American Board of Sleep Medicine  ELECTRONICALLY SIGNED ON:  06/09/2013, 12:21 PM [place] PH: [phone]   FX: [phone] [name]

## 2013-06-14 ENCOUNTER — Other Ambulatory Visit: Payer: Self-pay

## 2013-06-14 DIAGNOSIS — Z1231 Encounter for screening mammogram for malignant neoplasm of breast: Secondary | ICD-10-CM

## 2013-07-05 ENCOUNTER — Ambulatory Visit: Payer: Medicaid Other

## 2013-07-24 ENCOUNTER — Emergency Department (HOSPITAL_BASED_OUTPATIENT_CLINIC_OR_DEPARTMENT_OTHER): Payer: Medicaid Other

## 2013-07-24 ENCOUNTER — Emergency Department (HOSPITAL_BASED_OUTPATIENT_CLINIC_OR_DEPARTMENT_OTHER)
Admission: EM | Admit: 2013-07-24 | Discharge: 2013-07-24 | Disposition: A | Discharge status: Home / Self Care | Payer: Medicaid Other | Attending: Emergency Medicine | Admitting: Emergency Medicine

## 2013-07-24 ENCOUNTER — Encounter (HOSPITAL_BASED_OUTPATIENT_CLINIC_OR_DEPARTMENT_OTHER): Payer: Self-pay | Admitting: Emergency Medicine

## 2013-07-24 DIAGNOSIS — E669 Obesity, unspecified: Secondary | ICD-10-CM | POA: Insufficient documentation

## 2013-07-24 DIAGNOSIS — J069 Acute upper respiratory infection, unspecified: Secondary | ICD-10-CM | POA: Insufficient documentation

## 2013-07-24 DIAGNOSIS — Z8739 Personal history of other diseases of the musculoskeletal system and connective tissue: Secondary | ICD-10-CM | POA: Insufficient documentation

## 2013-07-24 DIAGNOSIS — G8929 Other chronic pain: Secondary | ICD-10-CM | POA: Insufficient documentation

## 2013-07-24 DIAGNOSIS — IMO0002 Reserved for concepts with insufficient information to code with codable children: Secondary | ICD-10-CM | POA: Insufficient documentation

## 2013-07-24 DIAGNOSIS — Z8719 Personal history of other diseases of the digestive system: Secondary | ICD-10-CM | POA: Insufficient documentation

## 2013-07-24 DIAGNOSIS — J45901 Unspecified asthma with (acute) exacerbation: Secondary | ICD-10-CM | POA: Insufficient documentation

## 2013-07-24 DIAGNOSIS — K219 Gastro-esophageal reflux disease without esophagitis: Secondary | ICD-10-CM | POA: Insufficient documentation

## 2013-07-24 DIAGNOSIS — Z8742 Personal history of other diseases of the female genital tract: Secondary | ICD-10-CM | POA: Insufficient documentation

## 2013-07-24 DIAGNOSIS — F410 Panic disorder [episodic paroxysmal anxiety] without agoraphobia: Secondary | ICD-10-CM | POA: Insufficient documentation

## 2013-07-24 DIAGNOSIS — I10 Essential (primary) hypertension: Secondary | ICD-10-CM | POA: Insufficient documentation

## 2013-07-24 DIAGNOSIS — J441 Chronic obstructive pulmonary disease with (acute) exacerbation: Secondary | ICD-10-CM | POA: Insufficient documentation

## 2013-07-24 DIAGNOSIS — Z87891 Personal history of nicotine dependence: Secondary | ICD-10-CM | POA: Insufficient documentation

## 2013-07-24 DIAGNOSIS — Z79899 Other long term (current) drug therapy: Secondary | ICD-10-CM | POA: Insufficient documentation

## 2013-07-24 DIAGNOSIS — J449 Chronic obstructive pulmonary disease, unspecified: Secondary | ICD-10-CM

## 2013-07-24 LAB — CBC WITH DIFFERENTIAL/PLATELET
Basophils Absolute: 0 10*3/uL (ref 0.0–0.1)
Basophils Relative: 0 % (ref 0–1)
Eosinophils Absolute: 0.2 10*3/uL (ref 0.0–0.7)
Eosinophils Relative: 2 % (ref 0–5)
HCT: 42.8 % (ref 36.0–46.0)
Hemoglobin: 14 g/dL (ref 12.0–15.0)
LYMPHS ABS: 4.9 10*3/uL — AB (ref 0.7–4.0)
LYMPHS PCT: 40 % (ref 12–46)
MCH: 29 pg (ref 26.0–34.0)
MCHC: 32.7 g/dL (ref 30.0–36.0)
MCV: 88.6 fL (ref 78.0–100.0)
Monocytes Absolute: 0.9 10*3/uL (ref 0.1–1.0)
Monocytes Relative: 7 % (ref 3–12)
NEUTROS ABS: 6.3 10*3/uL (ref 1.7–7.7)
NEUTROS PCT: 51 % (ref 43–77)
PLATELETS: 361 10*3/uL (ref 150–400)
RBC: 4.83 MIL/uL (ref 3.87–5.11)
RDW: 14.3 % (ref 11.5–15.5)
WBC: 12.3 10*3/uL — AB (ref 4.0–10.5)

## 2013-07-24 LAB — BASIC METABOLIC PANEL
BUN: 10 mg/dL (ref 6–23)
CALCIUM: 10.1 mg/dL (ref 8.4–10.5)
CO2: 27 meq/L (ref 19–32)
Chloride: 98 mEq/L (ref 96–112)
Creatinine, Ser: 0.8 mg/dL (ref 0.50–1.10)
GFR calc Af Amer: >90 mL/min (ref >90)
GFR, EST NON AFRICAN AMERICAN: 89 mL/min — AB (ref >90)
Glucose, Bld: 103 mg/dL — ABNORMAL HIGH (ref 70–99)
POTASSIUM: 3.9 meq/L (ref 3.7–5.3)
SODIUM: 139 meq/L (ref 137–147)

## 2013-07-24 LAB — TROPONIN I

## 2013-07-24 MED ORDER — ALBUTEROL SULFATE HFA 108 (90 BASE) MCG/ACT IN AERS: 2.0000 | INHALATION_SPRAY | RESPIRATORY_TRACT | Status: DC | PRN

## 2013-07-24 MED ORDER — PREDNISONE 50 MG PO TABS
60.0000 mg | ORAL_TABLET | Freq: Once | ORAL | Status: AC
  Administered 2013-07-24: 60 mg via ORAL
  Filled 2013-07-24 (×2): qty 1

## 2013-07-24 MED ORDER — IPRATROPIUM-ALBUTEROL 0.5-2.5 (3) MG/3ML IN SOLN
3.0000 mL | RESPIRATORY_TRACT | Status: DC
  Administered 2013-07-24: 3 mL via RESPIRATORY_TRACT
  Filled 2013-07-24: qty 3

## 2013-07-24 MED ORDER — AZITHROMYCIN 250 MG PO TABS: 250.0000 mg | ORAL_TABLET | Freq: Every day | ORAL | Status: DC

## 2013-07-24 MED ORDER — PREDNISONE 20 MG PO TABS: 60.0000 mg | ORAL_TABLET | Freq: Every day | ORAL | Status: DC

## 2013-07-24 MED ORDER — ACETAMINOPHEN 325 MG PO TABS
650.0000 mg | ORAL_TABLET | Freq: Once | ORAL | Status: AC
  Administered 2013-07-24: 650 mg via ORAL
  Filled 2013-07-24: qty 2

## 2013-07-24 MED ORDER — AZITHROMYCIN 250 MG PO TABS
500.0000 mg | ORAL_TABLET | Freq: Once | ORAL | Status: AC
  Administered 2013-07-24: 500 mg via ORAL
  Filled 2013-07-24: qty 2

## 2013-07-24 NOTE — Discharge Instructions (Signed)
Chronic Obstructive Pulmonary Disease  Chronic obstructive pulmonary disease (COPD) is a common lung condition in which airflow from the lungs is limited. COPD is a general term that can be used to describe many different lung problems that limit airflow, including both chronic bronchitis and emphysema.  If you have COPD, your lung function will probably never return to normal, but there are measures you can take to improve lung function and make yourself feel better.   CAUSES   · Smoking (common).    · Exposure to secondhand smoke.    · Genetic problems.  · Chronic inflammatory lung diseases or recurrent infections.  SYMPTOMS   · Shortness of breath, especially with physical activity.    · Deep, persistent (chronic) cough with a large amount of thick mucus.    · Wheezing.    · Rapid breaths (tachypnea).    · Gray or bluish discoloration (cyanosis) of the skin, especially in fingers, toes, or lips.    · Fatigue.    · Weight loss.    · Frequent infections or episodes when breathing symptoms become much worse (exacerbations).    · Chest tightness.  DIAGNOSIS   Your healthcare provider will take a medical history and perform a physical examination to make the initial diagnosis.  Additional tests for COPD may include:   · Lung (pulmonary) function tests.  · Chest X-ray.  · CT scan.  · Blood tests.  TREATMENT   Treatment available to help you feel better when you have COPD include:   · Inhaler and nebulizer medicines. These help manage the symptoms of COPD and make your breathing more comfortable  · Supplemental oxygen. Supplemental oxygen is only helpful if you have a low oxygen level in your blood.    · Exercise and physical activity. These are beneficial for nearly all people with COPD. Some people may also benefit from a pulmonary rehabilitation program.  HOME CARE INSTRUCTIONS   · Take all medicines (inhaled or pills) as directed by your health care provider.  · Only take over-the-counter or prescription medicines  for pain, fever, or discomfort as directed by your health care provider.    · Avoid over-the-counter medicines or cough syrups that dry up your airway (such as antihistamines) and slow down the elimination of secretions unless instructed otherwise by your healthcare provider.    · If you are a smoker, the most important thing that you can do is stop smoking. Continuing to smoke will cause further lung damage and breathing trouble. Ask your health care provider for help with quitting smoking. He or she can direct you to community resources or hospitals that provide support.  · Avoid exposure to irritants such as smoke, chemicals, and fumes that aggravate your breathing.  · Use oxygen therapy and pulmonary rehabilitation if directed by your health care provider. If you require home oxygen therapy, ask your healthcare provider whether you should purchase a pulse oximeter to measure your oxygen level at home.    · Avoid contact with individuals who have a contagious illness.  · Avoid extreme temperature and humidity changes.  · Eat healthy foods. Eating smaller, more frequent meals and resting before meals may help you maintain your strength.  · Stay active, but balance activity with periods of rest. Exercise and physical activity will help you maintain your ability to do things you want to do.  · Preventing infection and hospitalization is very important when you have COPD. Make sure to receive all the vaccines your health care provider recommends, especially the pneumococcal and influenza vaccines. Ask your healthcare provider whether you   in (inhaling) through your nose for 1 second. Then, purse your lips as if you were going to whistle and breathe out (exhale)  through the pursed lips for 2 seconds.   Diaphragmatic breathing. Start by putting one hand on your abdomen just above your waist. Inhale slowly through your nose. The hand on your abdomen should move out. Then purse your lips and exhale slowly. You should be able to feel the hand on your abdomen moving in as you exhale.   Learn and use controlled coughing to clear mucus from your lungs. Controlled coughing is a series of short, progressive coughs. The steps of controlled coughing are:  1. Lean your head slightly forward.  2. Breathe in deeply using diaphragmatic breathing.  3. Try to hold your breath for 3 seconds.  4. Keep your mouth slightly open while coughing twice.  5. Spit any mucus out into a tissue.  6. Rest and repeat the steps once or twice as needed. SEEK MEDICAL CARE IF:   You are coughing up more mucus than usual.   There is a change in the color or thickness of your mucus.   Your breathing is more labored than usual.   Your breathing is faster than usual.  SEEK IMMEDIATE MEDICAL CARE IF:   You have shortness of breath while you are resting.   You have shortness of breath that prevents you from:  Being able to talk.   Performing your usual physical activities.   You have chest pain lasting longer than 5 minutes.   Your skin color is more cyanotic than usual.  You measure low oxygen saturations for longer than 5 minutes with a pulse oximeter. MAKE SURE YOU:   Understand these instructions.  Will watch your condition.  Will get help right away if you are not doing well or get worse. Document Released: 01/20/2005 Document Revised: 01/31/2013 Document Reviewed: 12/07/2012 [facility] Patient Information 2014 [place], [place].  Upper Respiratory Infection, Adult An upper respiratory infection (URI) is also sometimes known as the common cold. The upper respiratory tract includes the nose, sinuses, throat, trachea, and bronchi. Bronchi are the airways  leading to the lungs. Most people improve within 1 week, but symptoms can last up to 2 weeks. A residual cough may last even longer.  CAUSES Many different viruses can infect the tissues lining the upper respiratory tract. The tissues become irritated and inflamed and often become very moist. Mucus production is also common. A cold is contagious. You can easily spread the virus to others by oral contact. This includes kissing, sharing a glass, coughing, or sneezing. Touching your mouth or nose and then touching a surface, which is then touched by another person, can also spread the virus. SYMPTOMS  Symptoms typically develop 1 to 3 days after you come in contact with a cold virus. Symptoms vary from person to person. They may include:  Runny nose.  Sneezing.  Nasal congestion.  Sinus irritation.  Sore throat.  Loss of voice (laryngitis).  Cough.  Fatigue.  Muscle aches.  Loss of appetite.  Headache.  Low-grade fever. DIAGNOSIS  You might diagnose your own cold based on familiar symptoms, since most people get a cold 2 to 3 times a year. Your caregiver can confirm this based on your exam. Most importantly, your caregiver can check that your symptoms are not due to another disease such as strep throat, sinusitis, pneumonia, asthma, or epiglottitis. Blood tests, throat tests, and X-rays are not necessary to diagnose a common cold,  but they may sometimes be helpful in excluding other more serious diseases. Your caregiver will decide if any further tests are required. RISKS AND COMPLICATIONS  You may be at risk for a more severe case of the common cold if you smoke cigarettes, have chronic heart disease (such as heart failure) or lung disease (such as asthma), or if you have a weakened immune system. The very young and very old are also at risk for more serious infections. Bacterial sinusitis, middle ear infections, and bacterial pneumonia can complicate the common cold. The common cold  can worsen asthma and chronic obstructive pulmonary disease (COPD). Sometimes, these complications can require emergency medical care and may be life-threatening. PREVENTION  The best way to protect against getting a cold is to practice good hygiene. Avoid oral or hand contact with people with cold symptoms. Wash your hands often if contact occurs. There is no clear evidence that vitamin C, vitamin E, echinacea, or exercise reduces the chance of developing a cold. However, it is always recommended to get plenty of rest and practice good nutrition. TREATMENT  Treatment is directed at relieving symptoms. There is no cure. Antibiotics are not effective, because the infection is caused by a virus, not by bacteria. Treatment may include:  Increased fluid intake. Sports drinks offer valuable electrolytes, sugars, and fluids.  Breathing heated mist or steam (vaporizer or shower).  Eating chicken soup or other clear broths, and maintaining good nutrition.  Getting plenty of rest.  Using gargles or lozenges for comfort.  Controlling fevers with ibuprofen or acetaminophen as directed by your caregiver.  Increasing usage of your inhaler if you have asthma. Zinc gel and zinc lozenges, taken in the first 24 hours of the common cold, can shorten the duration and lessen the severity of symptoms. Pain medicines may help with fever, muscle aches, and throat pain. A variety of non-prescription medicines are available to treat congestion and runny nose. Your caregiver can make recommendations and may suggest nasal or lung inhalers for other symptoms.  HOME CARE INSTRUCTIONS   Only take over-the-counter or prescription medicines for pain, discomfort, or fever as directed by your caregiver.  Use a warm mist humidifier or inhale steam from a shower to increase air moisture. This may keep secretions moist and make it easier to breathe.  Drink enough water and fluids to keep your urine clear or pale  yellow.  Rest as needed.  Return to work when your temperature has returned to normal or as your caregiver advises. You may need to stay home longer to avoid infecting others. You can also use a face mask and careful hand washing to prevent spread of the virus. SEEK MEDICAL CARE IF:   After the first few days, you feel you are getting worse rather than better.  You need your caregiver's advice about medicines to control symptoms.  You develop chills, worsening shortness of breath, or brown or red sputum. These may be signs of pneumonia.  You develop yellow or brown nasal discharge or pain in the face, especially when you bend forward. These may be signs of sinusitis.  You develop a fever, swollen neck glands, pain with swallowing, or white areas in the back of your throat. These may be signs of strep throat. SEEK IMMEDIATE MEDICAL CARE IF:   You have a fever.  You develop severe or persistent headache, ear pain, sinus pain, or chest pain.  You develop wheezing, a prolonged cough, cough up blood, or have a change in your  usual mucus (if you have chronic lung disease).  You develop sore muscles or a stiff neck. Document Released: 10/06/2000 Document Revised: 07/05/2011 Document Reviewed: 08/14/2010 [facility] Patient Information 2014 [place], [place].

## 2013-07-24 NOTE — ED Notes (Signed)
Sob and chest pain. Productive cough with brown sputum x 4 days. Voice sounds hoarse.

## 2013-07-24 NOTE — ED Provider Notes (Signed)
This chart was scribed for [place], DO by Zettie Pho, ED Scribe. This patient was seen in room MH05/MH05 and the patient's care was started at 6:17 PM.  CHIEF COMPLAINT: shortness of breath, chest pain  HPI:  HPI Comments: Felicia Molina is a 43 y.o. female with a history of asthma, COPD, prior tobacco abuse who presents to the Emergency Department complaining of a productive cough with intermittent green/yellow and white-colored sputum with associated hoarse voice, sore throat, diffuse myalgias, and chills onset 4 days ago. Patient reports some associated, mild, intermittent shortness of breath and chest wall pain secondary to the cough. She reports taking Mucinex and Robitussin at home without significant relief. Patient denies exposure to sick contacts. She denies wheezing, fever, emesis, diarrhea. She denies any recent hospitalization, prolonged periods of immobilization, or history of DVT/PE. Patient reports allergies to clindamycin/lincomycin and ibuprofen. Patient also has a history of HTN, reflux, fibromyalgia/chronic pain.   ROS: See HPI Constitutional: chills, no fever  Eyes: no drainage  ENT: hoarse voice, sore throat, no runny nose   Cardiovascular: chest (wall) pain  Resp: cough, SOB, no wheezing GI: no vomiting, no diarrhea GU: no dysuria Integumentary: no rash  Allergy: no hives  Musculoskeletal: myalgias, no leg swelling  Neurological: no slurred speech ROS otherwise negative  PAST MEDICAL HISTORY/PAST SURGICAL HISTORY:  Past Medical History  Diagnosis Date  . Asthma   . Hypertension   . Fibromyalgia   . Neurogenic bladder   . Back pain, chronic   . Panic attacks   . Reflux     MEDICATIONS:  Prior to Admission medications   Medication Sig Start Date End Date Taking? Authorizing Provider  albuterol (PROVENTIL HFA;VENTOLIN HFA) 108 (90 BASE) MCG/ACT inhaler Inhale 2 puffs into the lungs every 6 (six) hours as needed. Takes for shortness of breath     Historical Provider, MD  ALPRAZolam Duanne Moron) 0.5 MG tablet Take 0.5 mg by mouth at bedtime as needed. Takes for depression    Historical Provider, MD  budesonide-formoterol (SYMBICORT) 160-4.5 MCG/ACT inhaler Inhale 2 puffs into the lungs 2 (two) times daily.    Historical Provider, MD  cyclobenzaprine (FLEXERIL) 10 MG tablet Take 10 mg by mouth 3 (three) times daily as needed. Takes for muscle spasms    Historical Provider, MD  dextromethorphan (DELSYM) 30 MG/5ML liquid Take 10 mLs (60 mg total) by mouth 2 (two) times daily. 03/04/13   Billy Fischer, MD  diclofenac sodium (VOLTAREN) 1 % GEL Apply 2 g topically 4 (four) times daily.    Historical Provider, MD  furosemide (LASIX) 40 MG tablet Take 1 tablet (40 mg total) by mouth daily. 05/09/12   Verlee Monte, MD  guaiFENesin (MUCINEX) 600 MG 12 hr tablet Take 2 tablets (1,200 mg total) by mouth 2 (two) times daily. 05/09/12   Verlee Monte, MD  ipratropium (ATROVENT) 0.06 % nasal spray Place 2 sprays into the nose 4 (four) times daily. 03/04/13   Billy Fischer, MD  losartan (COZAAR) 100 MG tablet Take 100 mg by mouth daily.    Historical Provider, MD  losartan (COZAAR) 25 MG tablet Take 25 mg by mouth daily.    Historical Provider, MD  montelukast (SINGULAIR) 10 MG tablet Take 10 mg by mouth at bedtime.    Historical Provider, MD  OMEPRAZOLE PO Take 1 tablet by mouth daily.    Historical Provider, MD  oxyCODONE-acetaminophen (PERCOCET) 10-325 MG per tablet Take 1 tablet by mouth every 4 (four) hours as needed.  Takes for pain    Historical Provider, MD  oxymorphone (OPANA ER) 20 MG 12 hr tablet Take 20 mg by mouth every 12 (twelve) hours.    Historical Provider, MD  Phenyleph-Promethazine-Cod 5-6.25-10 MG/5ML SYRP Take 5 mLs by mouth every 6 (six) hours as needed. 05/19/13   Hope Bunnie Pion, NP  predniSONE (STERAPRED UNI-PAK) 10 MG tablet Take 6-5-4-3-2-1 tabs PO daily till gone 05/09/12   Verlee Monte, MD  pregabalin (LYRICA) 75 MG capsule Take 75 mg by mouth  3 (three) times daily.    Historical Provider, MD    ALLERGIES:  Allergies  Allergen Reactions  . Clindamycin/Lincomycin Hives and Swelling  . Ibuprofen Hives    Hives across the face    SOCIAL HISTORY:  History  Substance Use Topics  . Smoking status: Former Research scientist (life sciences)  . Smokeless tobacco: Never Used     Comment: quit 2004  . Alcohol Use: No    FAMILY HISTORY: No family history on file.  EXAM: Triage Vitals: BP 172/76  Pulse 106  Temp(Src) 98.6 F (37 C) (Oral)  Resp 22  Ht 5' (1.524 m)  Wt 305 lb (138.347 kg)  BMI 59.57 kg/m2  SpO2 98%  LMP 07/22/2013  CONSTITUTIONAL: Alert and oriented and responds appropriately to questions. Well-appearing; well-nourished; obese HEAD: Normocephalic EYES: Conjunctivae clear, PERRL ENT: normal nose; no rhinorrhea; moist mucous membranes; pharynx without lesions noted; no trismus or drooling, no tonsillar hypertrophy or exudate; hoarse voice, but not muffled  NECK: Supple, no meningismus, no LAD  CARD: Regular and tachycardic; S1 and S2 appreciated; no murmurs, no clicks, no rubs, no gallops RESP: Normal chest excursion without splinting or tachypnea; breath sounds clear and equal bilaterally; no wheezes, no rhonchi, no rales, no respiratory distress or hypoxia ABD/GI: Normal bowel sounds; non-distended; soft, non-tender, no rebound, no guarding BACK:  The back appears normal and is non-tender to palpation, there is no CVA tenderness EXT: Normal ROM in all joints; non-tender to palpation; no edema; normal capillary refill; no cyanosis    SKIN: Normal color for age and race; warm NEURO: Moves all extremities equally PSYCH: The patient's mood and manner are appropriate. Grooming and personal hygiene are appropriate.  MEDICAL DECISION MAKING:  6:23 PM- Patient with a history of asthma presents to the ED with a productive cough and other URI-like symptoms. She reports some shortness of breath and chest pain secondary to the cough. Ordered  a chest x-ray, which was negative. Will order Zithromax to cover for possible developing pneumonia. EKG results were also normal. Patient is relatively low-risk for PE, but will order CBC, BMP, and troponin to rule out. Ordered a Duoneb breathing treatment, Tylenol, and prednisone to manage symptoms. Patient's pulse oximetry and cardiac signs will be monitored. Patient and family agreeable to plan.   ED PROGRESS:  DIAGNOSTIC STUDIES: Oxygen Saturation is 98% on room air, normal by my interpretation.    COORDINATION OF CARE: 6:22 PM- Discussed that chest x-ray and EKG results were normal. Ordered Tylenol, a breathing treatment, azithromycin, and prednisone to manage symptoms. Ordered CBC, BMP, troponin. Discussed treatment plan with patient at bedside and patient verbalized agreement.   7:25 PM  Pt has leukocytosis with left shift. Her labs are otherwise unremarkable. Troponin negative. Chest x-ray shows no obvious infiltrate but given she has clinical pneumonia, will start on azithromycin. We'll also give prescription for albuterol prednisone given her history of COPD. Given strict return precautions and supportive care instructions. Patient verbalizes understanding is comfortable with this  plan.  Results for orders placed during the hospital encounter of 07/24/13  CBC WITH DIFFERENTIAL      Result Value Ref Range   WBC 12.3 (*) 4.0 - 10.5 K/uL   RBC 4.83  3.87 - 5.11 MIL/uL   Hemoglobin 14.0  12.0 - 15.0 g/dL   HCT 42.8  36.0 - 46.0 %   MCV 88.6  78.0 - 100.0 fL   MCH 29.0  26.0 - 34.0 pg   MCHC 32.7  30.0 - 36.0 g/dL   RDW 14.3  11.5 - 15.5 %   Platelets 361  150 - 400 K/uL   Neutrophils Relative % 51  43 - 77 %   Neutro Abs 6.3  1.7 - 7.7 K/uL   Lymphocytes Relative 40  12 - 46 %   Lymphs Abs 4.9 (*) 0.7 - 4.0 K/uL   Monocytes Relative 7  3 - 12 %   Monocytes Absolute 0.9  0.1 - 1.0 K/uL   Eosinophils Relative 2  0 - 5 %   Eosinophils Absolute 0.2  0.0 - 0.7 K/uL   Basophils  Relative 0  0 - 1 %   Basophils Absolute 0.0  0.0 - 0.1 K/uL  BASIC METABOLIC PANEL      Result Value Ref Range   Sodium 139  137 - 147 mEq/L   Potassium 3.9  3.7 - 5.3 mEq/L   Chloride 98  96 - 112 mEq/L   CO2 27  19 - 32 mEq/L   Glucose, Bld 103 (*) 70 - 99 mg/dL   BUN 10  6 - 23 mg/dL   Creatinine, Ser 0.80  0.50 - 1.10 mg/dL   Calcium 10.1  8.4 - 10.5 mg/dL   GFR calc non Af Amer 89 (*) >90 mL/min   GFR calc Af Amer >90  >90 mL/min  TROPONIN I      Result Value Ref Range   Troponin I <0.30  <0.30 ng/mL   Dg Chest 2 View  07/24/2013   CLINICAL DATA:  Shortness of breath, chest pain  EXAM: CHEST  2 VIEW  COMPARISON:  Chest x-ray of 05/19/2013  FINDINGS: No active infiltrate or effusion is seen. The heart remains within upper limits of normal. No bony abnormality is seen.  IMPRESSION: No active cardiopulmonary disease.   Electronically Signed   By: Ivar Drape M.D.   On: 07/24/2013 17:28    Date: 07/24/2013 17:14  Rate: 99  Rhythm: normal sinus rhythm  QRS Axis: normal  Intervals: normal  ST/T Wave abnormalities: normal  Conduction Disutrbances: none  Narrative Interpretation: unremarkable; baseline wander      I personally performed the services described in this documentation, which was scribed in my presence. The recorded information has been reviewed and is accurate.        [place], DO 07/24/13 1926

## 2013-09-25 ENCOUNTER — Emergency Department (HOSPITAL_BASED_OUTPATIENT_CLINIC_OR_DEPARTMENT_OTHER)
Admission: EM | Admit: 2013-09-25 | Discharge: 2013-09-25 | Disposition: A | Discharge status: Home / Self Care | Payer: Medicaid Other | Attending: Emergency Medicine | Admitting: Emergency Medicine

## 2013-09-25 ENCOUNTER — Encounter (HOSPITAL_BASED_OUTPATIENT_CLINIC_OR_DEPARTMENT_OTHER): Payer: Self-pay | Admitting: Emergency Medicine

## 2013-09-25 DIAGNOSIS — IMO0001 Reserved for inherently not codable concepts without codable children: Secondary | ICD-10-CM | POA: Insufficient documentation

## 2013-09-25 DIAGNOSIS — Z79899 Other long term (current) drug therapy: Secondary | ICD-10-CM | POA: Insufficient documentation

## 2013-09-25 DIAGNOSIS — Z792 Long term (current) use of antibiotics: Secondary | ICD-10-CM | POA: Insufficient documentation

## 2013-09-25 DIAGNOSIS — Z87448 Personal history of other diseases of urinary system: Secondary | ICD-10-CM | POA: Insufficient documentation

## 2013-09-25 DIAGNOSIS — F410 Panic disorder [episodic paroxysmal anxiety] without agoraphobia: Secondary | ICD-10-CM | POA: Insufficient documentation

## 2013-09-25 DIAGNOSIS — Z791 Long term (current) use of non-steroidal anti-inflammatories (NSAID): Secondary | ICD-10-CM | POA: Insufficient documentation

## 2013-09-25 DIAGNOSIS — K219 Gastro-esophageal reflux disease without esophagitis: Secondary | ICD-10-CM | POA: Insufficient documentation

## 2013-09-25 DIAGNOSIS — R21 Rash and other nonspecific skin eruption: Secondary | ICD-10-CM | POA: Insufficient documentation

## 2013-09-25 DIAGNOSIS — M545 Low back pain, unspecified: Secondary | ICD-10-CM | POA: Insufficient documentation

## 2013-09-25 DIAGNOSIS — M549 Dorsalgia, unspecified: Secondary | ICD-10-CM

## 2013-09-25 DIAGNOSIS — Z87891 Personal history of nicotine dependence: Secondary | ICD-10-CM | POA: Insufficient documentation

## 2013-09-25 DIAGNOSIS — G8929 Other chronic pain: Secondary | ICD-10-CM | POA: Insufficient documentation

## 2013-09-25 DIAGNOSIS — J029 Acute pharyngitis, unspecified: Secondary | ICD-10-CM | POA: Insufficient documentation

## 2013-09-25 DIAGNOSIS — IMO0002 Reserved for concepts with insufficient information to code with codable children: Secondary | ICD-10-CM | POA: Insufficient documentation

## 2013-09-25 DIAGNOSIS — J45909 Unspecified asthma, uncomplicated: Secondary | ICD-10-CM | POA: Insufficient documentation

## 2013-09-25 DIAGNOSIS — I10 Essential (primary) hypertension: Secondary | ICD-10-CM | POA: Insufficient documentation

## 2013-09-25 HISTORY — DX: Morbid (severe) obesity due to excess calories: E66.01

## 2013-09-25 LAB — URINALYSIS, ROUTINE W REFLEX MICROSCOPIC
Bilirubin Urine: NEGATIVE
Glucose, UA: NEGATIVE mg/dL
Hgb urine dipstick: NEGATIVE
Ketones, ur: NEGATIVE mg/dL
Leukocytes, UA: NEGATIVE
NITRITE: NEGATIVE
PH: 6 (ref 5.0–8.0)
PROTEIN: NEGATIVE mg/dL
Specific Gravity, Urine: 1.017 (ref 1.005–1.030)
Urobilinogen, UA: 0.2 mg/dL (ref 0.0–1.0)

## 2013-09-25 LAB — RAPID STREP SCREEN (MED CTR MEBANE ONLY): Streptococcus, Group A Screen (Direct): NEGATIVE

## 2013-09-25 MED ORDER — AMOXICILLIN 500 MG PO CAPS: 500.0000 mg | ORAL_CAPSULE | Freq: Three times a day (TID) | ORAL | Status: AC

## 2013-09-25 NOTE — ED Notes (Addendum)
Pt has sore throat that started today.  Rash on upper extremities x2 days.  Back pain and dysuria x2 days.  Took Percocet at 3:30 but it didn't help.

## 2013-09-25 NOTE — ED Notes (Signed)
rx x 1 given for amoxicillin

## 2013-09-25 NOTE — ED Provider Notes (Signed)
CSN: [id number]     Arrival date & time 09/25/13  1749 History   First MD Initiated Contact with Patient 09/25/13 1816     Chief Complaint  Patient presents with  . Sore Throat     (Consider location/radiation/quality/duration/timing/severity/associated sxs/prior Treatment) Patient is a 43 y.o. female presenting with pharyngitis. The history is provided by the patient. No language interpreter was used.  Sore Throat This is a new problem. Episode onset: 2 days. The problem occurs constantly. The problem has been gradually worsening. Associated symptoms include joint swelling, a rash and a sore throat. Nothing aggravates the symptoms. She has tried nothing for the symptoms. The treatment provided no relief.   Pt reports she has had a rash for years.  Pt reports she has pain in her low back and a sorethroat.  Pt is worried about a kidney infection Past Medical History  Diagnosis Date  . Asthma   . Hypertension   . Fibromyalgia   . Neurogenic bladder   . Back pain, chronic   . Panic attacks   . Reflux   . Morbid obesity    Past Surgical History  Procedure Laterality Date  . Back surgery    . Carpal tunnel release    . Ureteral stent placement  2008  . Bladder stimulator     No family history on file. History  Substance Use Topics  . Smoking status: Former Research scientist (life sciences)  . Smokeless tobacco: Never Used     Comment: quit 2004  . Alcohol Use: No   OB History   Grav Para Term Preterm Abortions TAB SAB Ect Mult Living   6 4 4  0 2 0 2 0 0 4     Review of Systems  HENT: Positive for sore throat.   Musculoskeletal: Positive for joint swelling.  Skin: Positive for rash.  All other systems reviewed and are negative.     Allergies  Clindamycin/lincomycin and Ibuprofen  Home Medications   Prior to Admission medications   Medication Sig Start Date End Date Taking? Authorizing Provider  albuterol (PROVENTIL HFA;VENTOLIN HFA) 108 (90 BASE) MCG/ACT inhaler Inhale 2 puffs into the  lungs every 6 (six) hours as needed. Takes for shortness of breath    Historical Provider, MD  albuterol (PROVENTIL HFA;VENTOLIN HFA) 108 (90 BASE) MCG/ACT inhaler Inhale 2 puffs into the lungs every 4 (four) hours as needed for wheezing or shortness of breath. 07/24/13   Kristen N Ward, DO  ALPRAZolam (XANAX) 0.5 MG tablet Take 0.5 mg by mouth at bedtime as needed. Takes for depression    Historical Provider, MD  azithromycin (ZITHROMAX) 250 MG tablet Take 1 tablet (250 mg total) by mouth daily. 07/24/13   Kristen N Ward, DO  budesonide-formoterol (SYMBICORT) 160-4.5 MCG/ACT inhaler Inhale 2 puffs into the lungs 2 (two) times daily.    Historical Provider, MD  cyclobenzaprine (FLEXERIL) 10 MG tablet Take 10 mg by mouth 3 (three) times daily as needed. Takes for muscle spasms    Historical Provider, MD  dextromethorphan (DELSYM) 30 MG/5ML liquid Take 10 mLs (60 mg total) by mouth 2 (two) times daily. 03/04/13   Billy Fischer, MD  diclofenac sodium (VOLTAREN) 1 % GEL Apply 2 g topically 4 (four) times daily.    Historical Provider, MD  furosemide (LASIX) 40 MG tablet Take 1 tablet (40 mg total) by mouth daily. 05/09/12   Verlee Monte, MD  guaiFENesin (MUCINEX) 600 MG 12 hr tablet Take 2 tablets (1,200 mg total) by mouth 2 (  two) times daily. 05/09/12   Verlee Monte, MD  ipratropium (ATROVENT) 0.06 % nasal spray Place 2 sprays into the nose 4 (four) times daily. 03/04/13   Billy Fischer, MD  losartan (COZAAR) 100 MG tablet Take 100 mg by mouth daily.    Historical Provider, MD  losartan (COZAAR) 25 MG tablet Take 25 mg by mouth daily.    Historical Provider, MD  montelukast (SINGULAIR) 10 MG tablet Take 10 mg by mouth at bedtime.    Historical Provider, MD  OMEPRAZOLE PO Take 1 tablet by mouth daily.    Historical Provider, MD  oxyCODONE-acetaminophen (PERCOCET) 10-325 MG per tablet Take 1 tablet by mouth every 4 (four) hours as needed. Takes for pain    Historical Provider, MD  oxymorphone (OPANA ER) 20 MG  12 hr tablet Take 20 mg by mouth every 12 (twelve) hours.    Historical Provider, MD  Phenyleph-Promethazine-Cod 5-6.25-10 MG/5ML SYRP Take 5 mLs by mouth every 6 (six) hours as needed. 05/19/13   Hope Bunnie Pion, NP  predniSONE (DELTASONE) 20 MG tablet Take 3 tablets (60 mg total) by mouth daily. 07/24/13   Kristen N Ward, DO  predniSONE (STERAPRED UNI-PAK) 10 MG tablet Take 6-5-4-3-2-1 tabs PO daily till gone 05/09/12   Verlee Monte, MD  pregabalin (LYRICA) 75 MG capsule Take 75 mg by mouth 3 (three) times daily.    Historical Provider, MD   BP 181/67  Pulse 96  Temp(Src) 99.2 F (37.3 C) (Oral)  Resp 16  Ht 5' (1.524 m)  Wt 297 lb (134.718 kg)  BMI 58.00 kg/m2  SpO2 98%  LMP 09/11/2013 Physical Exam  Nursing note and vitals reviewed. Constitutional: She is oriented to person, place, and time. She appears well-developed and well-nourished.  HENT:  Head: Normocephalic and atraumatic.  Right Ear: External ear normal.  Left Ear: External ear normal.  Nose: Nose normal.  Mouth/Throat: Oropharynx is clear and moist.  Eyes: Conjunctivae and EOM are normal.  Neck: Normal range of motion.  Cardiovascular: Normal rate and normal heart sounds.   Pulmonary/Chest: Effort normal.  Abdominal: Soft. She exhibits no distension.  Musculoskeletal: Normal range of motion.  Neurological: She is alert and oriented to person, place, and time.  Skin: Skin is warm.  Psychiatric: She has a normal mood and affect.    ED Course  Procedures (including critical care time) Labs Review Labs Reviewed  URINALYSIS, ROUTINE W REFLEX MICROSCOPIC - Abnormal; Notable for the following:    APPearance CLOUDY (*)    All other components within normal limits  RAPID STREP SCREEN  CULTURE, GROUP A STREP    Imaging Review No results found.   EKG Interpretation None      MDM   Final diagnoses:  Sore throat  Back pain    keflex    Fransico Meadow, PA-C 09/25/13 2219

## 2013-09-25 NOTE — Discharge Instructions (Signed)
Rash A rash is a change in the color or texture of your skin. There are many different types of rashes. You may have other problems that accompany your rash. CAUSES   Infections.  Allergic reactions. This can include allergies to pets or foods.  Certain medicines.  Exposure to certain chemicals, soaps, or cosmetics.  Heat.  Exposure to poisonous plants.  Tumors, both cancerous and noncancerous. SYMPTOMS   Redness.  Scaly skin.  Itchy skin.  Dry or cracked skin.  Bumps.  Blisters.  Pain. DIAGNOSIS  Your caregiver may do a physical exam to determine what type of rash you have. A skin sample (biopsy) may be taken and examined under a microscope. TREATMENT  Treatment depends on the type of rash you have. Your caregiver may prescribe certain medicines. For serious conditions, you may need to see a skin doctor (dermatologist). HOME CARE INSTRUCTIONS   Avoid the substance that caused your rash.  Do not scratch your rash. This can cause infection.  You may take cool baths to help stop itching.  Only take over-the-counter or prescription medicines as directed by your caregiver.  Keep all follow-up appointments as directed by your caregiver. SEEK IMMEDIATE MEDICAL CARE IF:  You have increasing pain, swelling, or redness.  You have a fever.  You have new or severe symptoms.  You have body aches, diarrhea, or vomiting.  Your rash is not better after 3 days. MAKE SURE YOU:  Understand these instructions.  Will watch your condition.  Will get help right away if you are not doing well or get worse. Document Released: 04/02/2002 Document Revised: 07/05/2011 Document Reviewed: 01/25/2011 [facility] Patient Information 2014 [place], [place]. Back Pain, Adult Low back pain is very common. About 1 in 5 people have back pain.The cause of low back pain is rarely dangerous. The pain often gets better over time.About half of people with a sudden onset of back pain feel  better in just 2 weeks. About 8 in 10 people feel better by 6 weeks.  CAUSES Some common causes of back pain include:  Strain of the muscles or ligaments supporting the spine.  Wear and tear (degeneration) of the spinal discs.  Arthritis.  Direct injury to the back. DIAGNOSIS Most of the time, the direct cause of low back pain is not known.However, back pain can be treated effectively even when the exact cause of the pain is unknown.Answering your caregiver's questions about your overall health and symptoms is one of the most accurate ways to make sure the cause of your pain is not dangerous. If your caregiver needs more information, he or she may order lab work or imaging tests (X-rays or MRIs).However, even if imaging tests show changes in your back, this usually does not require surgery. HOME CARE INSTRUCTIONS For many people, back pain returns.Since low back pain is rarely dangerous, it is often a condition that people can learn to [facility] their own.   Remain active. It is stressful on the back to sit or stand in one place. Do not sit, drive, or stand in one place for more than 30 minutes at a time. Take short walks on level surfaces as soon as pain allows.Try to increase the length of time you walk each day.  Do not stay in bed.Resting more than 1 or 2 days can delay your recovery.  Do not avoid exercise or work.Your body is made to move.It is not dangerous to be active, even though your back may hurt.Your back will likely heal  faster if you return to being active before your pain is gone.  Pay attention to your body when you bend and lift. Many people have less discomfortwhen lifting if they bend their knees, keep the load close to their bodies,and avoid twisting. Often, the most comfortable positions are those that put less stress on your recovering back.  Find a comfortable position to sleep. Use a firm mattress and lie on your side with your knees slightly bent. If you  lie on your back, put a pillow under your knees.  Only take over-the-counter or prescription medicines as directed by your caregiver. Over-the-counter medicines to reduce pain and inflammation are often the most helpful.Your caregiver may prescribe muscle relaxant drugs.These medicines help dull your pain so you can more quickly return to your normal activities and healthy exercise.  Put ice on the injured area.  Put ice in a plastic bag.  Place a towel between your skin and the bag.  Leave the ice on for 15-20 minutes, 03-04 times a day for the first 2 to 3 days. After that, ice and heat may be alternated to reduce pain and spasms.  Ask your caregiver about trying back exercises and gentle massage. This may be of some benefit.  Avoid feeling anxious or stressed.Stress increases muscle tension and can worsen back pain.It is important to recognize when you are anxious or stressed and learn ways to manage it.Exercise is a great option. SEEK MEDICAL CARE IF:  You have pain that is not relieved with rest or medicine.  You have pain that does not improve in 1 week.  You have new symptoms.  You are generally not feeling well. SEEK IMMEDIATE MEDICAL CARE IF:   You have pain that radiates from your back into your legs.  You develop new bowel or bladder control problems.  You have unusual weakness or numbness in your arms or legs.  You develop nausea or vomiting.  You develop abdominal pain.  You feel faint. Document Released: 04/12/2005 Document Revised: 10/12/2011 Document Reviewed: 08/31/2010 [facility] Patient Information 2014 [place], [place]. Sore Throat A sore throat is pain, burning, irritation, or scratchiness of the throat. There is often pain or tenderness when swallowing or talking. A sore throat may be accompanied by other symptoms, such as coughing, sneezing, fever, and swollen neck glands. A sore throat is often the first sign of another sickness, such as a cold, flu,  strep throat, or mononucleosis (commonly known as mono). Most sore throats go away without medical treatment. CAUSES  The most common causes of a sore throat include:  A viral infection, such as a cold, flu, or mono.  A bacterial infection, such as strep throat, tonsillitis, or whooping cough.  Seasonal allergies.  Dryness in the air.  Irritants, such as smoke or pollution.  Gastroesophageal reflux disease (GERD). HOME CARE INSTRUCTIONS   Only take over-the-counter medicines as directed by your caregiver.  Drink enough fluids to keep your urine clear or pale yellow.  Rest as needed.  Try using throat sprays, lozenges, or sucking on hard candy to ease any pain (if older than 4 years or as directed).  Sip warm liquids, such as broth, herbal tea, or warm water with honey to relieve pain temporarily. You may also eat or drink cold or frozen liquids such as frozen ice pops.  Gargle with salt water (mix 1 tsp salt with 8 oz of water).  Do not smoke and avoid secondhand smoke.  Put a cool-mist humidifier in your bedroom  at night to moisten the air. You can also turn on a hot shower and sit in the bathroom with the door closed for 5 10 minutes. SEEK IMMEDIATE MEDICAL CARE IF:  You have difficulty breathing.  You are unable to swallow fluids, soft foods, or your saliva.  You have increased swelling in the throat.  Your sore throat does not get better in 7 days.  You have nausea and vomiting.  You have a fever or persistent symptoms for more than 2 3 days.  You have a fever and your symptoms suddenly get worse. MAKE SURE YOU:   Understand these instructions.  Will watch your condition.  Will get help right away if you are not doing well or get worse. Document Released: 05/20/2004 Document Revised: 03/29/2012 Document Reviewed: 12/19/2011 [facility] Patient Information 2014 [place], [place].

## 2013-09-26 NOTE — ED Provider Notes (Signed)
Medical screening examination/treatment/procedure(s) were conducted as a shared visit with non-physician practitioner(s) or resident  and myself.  I personally evaluated the patient during the encounter and agree with the findings and plan unless otherwise indicated.    I have personally reviewed any xrays and/ or EKG's with the provider and I agree with interpretation.   Well-appearing female with sore throat and musculoskeletal bodyaches. On exam patient well-appearing, talking on a cell phone, neck supple no meningismus, no exudate or signs of PTA. Followup outpatient discussed. Treatment per physician assistant.  Pharyngitis  Mariea Clonts, MD 09/26/13 [phone]

## 2013-09-27 LAB — CULTURE, GROUP A STREP

## 2013-12-06 ENCOUNTER — Ambulatory Visit: Payer: Medicaid Other

## 2013-12-25 ENCOUNTER — Ambulatory Visit: Payer: Medicaid Other

## 2014-01-02 ENCOUNTER — Ambulatory Visit: Payer: Medicaid Other

## 2014-01-08 ENCOUNTER — Ambulatory Visit: Payer: Medicaid Other

## 2014-01-18 ENCOUNTER — Ambulatory Visit: Payer: Medicaid Other

## 2014-02-18 ENCOUNTER — Telehealth (HOSPITAL_COMMUNITY): Payer: Self-pay | Admitting: Unknown Physician Specialty

## 2014-02-22 ENCOUNTER — Other Ambulatory Visit (HOSPITAL_COMMUNITY): Payer: Self-pay | Admitting: Internal Medicine

## 2014-02-22 DIAGNOSIS — M79662 Pain in left lower leg: Secondary | ICD-10-CM

## 2014-02-22 DIAGNOSIS — M7989 Other specified soft tissue disorders: Secondary | ICD-10-CM

## 2014-02-25 ENCOUNTER — Encounter (HOSPITAL_BASED_OUTPATIENT_CLINIC_OR_DEPARTMENT_OTHER): Payer: Self-pay | Admitting: Emergency Medicine

## 2014-02-25 ENCOUNTER — Ambulatory Visit (HOSPITAL_COMMUNITY): Payer: Medicaid Other

## 2014-02-27 ENCOUNTER — Ambulatory Visit (HOSPITAL_COMMUNITY)
Admission: RE | Admit: 2014-02-27 | Discharge: 2014-02-27 | Disposition: A | Discharge status: Home / Self Care | Payer: Medicaid Other | Source: Ambulatory Visit | Attending: Internal Medicine | Admitting: Internal Medicine

## 2014-02-27 DIAGNOSIS — M7989 Other specified soft tissue disorders: Secondary | ICD-10-CM | POA: Insufficient documentation

## 2014-02-27 DIAGNOSIS — M79609 Pain in unspecified limb: Secondary | ICD-10-CM

## 2014-02-27 DIAGNOSIS — M79662 Pain in left lower leg: Secondary | ICD-10-CM | POA: Insufficient documentation

## 2014-02-27 NOTE — Progress Notes (Signed)
VASCULAR LAB PRELIMINARY  PRELIMINARY  PRELIMINARY  PRELIMINARY  Left lower extremity venous duplex completed.    Preliminary report:  Left:  No evidence of DVT, superficial thrombosis, or Baker's cyst.  Sherleen Pangborn, RVS 02/27/2014, 1:16 PM

## 2014-04-27 ENCOUNTER — Emergency Department (HOSPITAL_BASED_OUTPATIENT_CLINIC_OR_DEPARTMENT_OTHER): Payer: Medicaid Other

## 2014-04-27 ENCOUNTER — Emergency Department (HOSPITAL_BASED_OUTPATIENT_CLINIC_OR_DEPARTMENT_OTHER)
Admission: EM | Admit: 2014-04-27 | Discharge: 2014-04-27 | Disposition: A | Discharge status: Home / Self Care | Payer: Medicaid Other | Attending: Emergency Medicine | Admitting: Emergency Medicine

## 2014-04-27 ENCOUNTER — Encounter (HOSPITAL_BASED_OUTPATIENT_CLINIC_OR_DEPARTMENT_OTHER): Payer: Self-pay | Admitting: *Deleted

## 2014-04-27 DIAGNOSIS — Z79899 Other long term (current) drug therapy: Secondary | ICD-10-CM | POA: Insufficient documentation

## 2014-04-27 DIAGNOSIS — Y9289 Other specified places as the place of occurrence of the external cause: Secondary | ICD-10-CM | POA: Diagnosis not present

## 2014-04-27 DIAGNOSIS — R0789 Other chest pain: Secondary | ICD-10-CM | POA: Insufficient documentation

## 2014-04-27 DIAGNOSIS — Z87891 Personal history of nicotine dependence: Secondary | ICD-10-CM | POA: Diagnosis not present

## 2014-04-27 DIAGNOSIS — Z792 Long term (current) use of antibiotics: Secondary | ICD-10-CM | POA: Insufficient documentation

## 2014-04-27 DIAGNOSIS — X58XXXA Exposure to other specified factors, initial encounter: Secondary | ICD-10-CM | POA: Insufficient documentation

## 2014-04-27 DIAGNOSIS — Z91018 Allergy to other foods: Secondary | ICD-10-CM

## 2014-04-27 DIAGNOSIS — Y998 Other external cause status: Secondary | ICD-10-CM | POA: Insufficient documentation

## 2014-04-27 DIAGNOSIS — F410 Panic disorder [episodic paroxysmal anxiety] without agoraphobia: Secondary | ICD-10-CM | POA: Diagnosis not present

## 2014-04-27 DIAGNOSIS — Z7952 Long term (current) use of systemic steroids: Secondary | ICD-10-CM | POA: Insufficient documentation

## 2014-04-27 DIAGNOSIS — I10 Essential (primary) hypertension: Secondary | ICD-10-CM | POA: Insufficient documentation

## 2014-04-27 DIAGNOSIS — G8929 Other chronic pain: Secondary | ICD-10-CM | POA: Diagnosis not present

## 2014-04-27 DIAGNOSIS — Z7951 Long term (current) use of inhaled steroids: Secondary | ICD-10-CM | POA: Diagnosis not present

## 2014-04-27 DIAGNOSIS — T781XXA Other adverse food reactions, not elsewhere classified, initial encounter: Secondary | ICD-10-CM | POA: Diagnosis not present

## 2014-04-27 DIAGNOSIS — M797 Fibromyalgia: Secondary | ICD-10-CM | POA: Diagnosis not present

## 2014-04-27 DIAGNOSIS — J45909 Unspecified asthma, uncomplicated: Secondary | ICD-10-CM | POA: Diagnosis not present

## 2014-04-27 DIAGNOSIS — Z8742 Personal history of other diseases of the female genital tract: Secondary | ICD-10-CM | POA: Insufficient documentation

## 2014-04-27 DIAGNOSIS — J069 Acute upper respiratory infection, unspecified: Secondary | ICD-10-CM | POA: Insufficient documentation

## 2014-04-27 DIAGNOSIS — Z8719 Personal history of other diseases of the digestive system: Secondary | ICD-10-CM | POA: Insufficient documentation

## 2014-04-27 DIAGNOSIS — R220 Localized swelling, mass and lump, head: Secondary | ICD-10-CM | POA: Diagnosis present

## 2014-04-27 DIAGNOSIS — Z791 Long term (current) use of non-steroidal anti-inflammatories (NSAID): Secondary | ICD-10-CM | POA: Diagnosis not present

## 2014-04-27 DIAGNOSIS — Y9389 Activity, other specified: Secondary | ICD-10-CM | POA: Diagnosis not present

## 2014-04-27 LAB — BASIC METABOLIC PANEL
Anion gap: 5 (ref 5–15)
BUN: 8 mg/dL (ref 6–23)
CO2: 27 mmol/L (ref 19–32)
CREATININE: 0.8 mg/dL (ref 0.50–1.10)
Calcium: 8.7 mg/dL (ref 8.4–10.5)
Chloride: 106 mEq/L (ref 96–112)
GFR calc Af Amer: >90 mL/min (ref >90)
GFR calc non Af Amer: 89 mL/min — ABNORMAL LOW (ref >90)
GLUCOSE: 85 mg/dL (ref 70–99)
POTASSIUM: 4.4 mmol/L (ref 3.5–5.1)
Sodium: 138 mmol/L (ref 135–145)

## 2014-04-27 LAB — CBC WITH DIFFERENTIAL/PLATELET
Basophils Absolute: 0 10*3/uL (ref 0.0–0.1)
Basophils Relative: 0 % (ref 0–1)
EOS ABS: 0.2 10*3/uL (ref 0.0–0.7)
EOS PCT: 2 % (ref 0–5)
HEMATOCRIT: 36.2 % (ref 36.0–46.0)
Hemoglobin: 11.8 g/dL — ABNORMAL LOW (ref 12.0–15.0)
LYMPHS ABS: 2.9 10*3/uL (ref 0.7–4.0)
Lymphocytes Relative: 34 % (ref 12–46)
MCH: 29.3 pg (ref 26.0–34.0)
MCHC: 32.6 g/dL (ref 30.0–36.0)
MCV: 89.8 fL (ref 78.0–100.0)
MONO ABS: 0.5 10*3/uL (ref 0.1–1.0)
MONOS PCT: 6 % (ref 3–12)
Neutro Abs: 4.8 10*3/uL (ref 1.7–7.7)
Neutrophils Relative %: 58 % (ref 43–77)
Platelets: 304 10*3/uL (ref 150–400)
RBC: 4.03 MIL/uL (ref 3.87–5.11)
RDW: 13.6 % (ref 11.5–15.5)
WBC: 8.4 10*3/uL (ref 4.0–10.5)

## 2014-04-27 LAB — TROPONIN I: Troponin I: <0.03 ng/mL (ref <0.031)

## 2014-04-27 MED ORDER — DIPHENHYDRAMINE HCL 25 MG PO CAPS
25.0000 mg | ORAL_CAPSULE | Freq: Once | ORAL | Status: AC
  Administered 2014-04-27: 25 mg via ORAL
  Filled 2014-04-27: qty 1

## 2014-04-27 MED ORDER — DIPHENHYDRAMINE HCL 25 MG PO TABS: 25.0000 mg | ORAL_TABLET | Freq: Four times a day (QID) | ORAL | Status: DC | PRN

## 2014-04-27 MED ORDER — BENZONATATE 100 MG PO CAPS: 100.0000 mg | ORAL_CAPSULE | Freq: Three times a day (TID) | ORAL | Status: DC

## 2014-04-27 NOTE — ED Provider Notes (Signed)
CSN: [id number]     Arrival date & time 04/27/14  1618 History   First MD Initiated Contact with Patient 04/27/14 1713     Chief Complaint  Patient presents with  . Allergic Reaction   Felicia Molina is a 44 y.o. female with a history of asthma, hypertension, fibromyalgia and chronic low back pain who presents the emergency department complaining of a possible allergic reaction to food as well as left-sided chest pain for the past 4 days. 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 She reports that tomatoes were removed but she may have had residual on the salad. She reports she woke up last night with some lip swelling, dry mouth, and throat swelling which is persisted to today. Patient reports she's been able to drink normally. Patient reports she has had allergic reactions tomatoes before and this feels similar. The patient also is complaining of left sided chest pain that radiates to her left arm for the past 4 days. She reports the pain has been constant and touching her chest is worse. The patient's pain is 7 out of 10 sharp. Patient reports her chest pain started while sitting on the couch. The patient denies shortness of breath but reports palpitations earlier today. She denies current palpitations. Patient also reports intermittent dry cough with some eye irritation for the past 3 days. Patient denies history of an MI. The patient denies smoking. Patient does report a history of MI in her mother in her 17s as well as her father in his 69s. The patient denies fevers, chills, shortness of breath, current palpitations, numbness, tingling, trouble swallowing, leg swelling, or weakness.  (Consider location/radiation/quality/duration/timing/severity/associated sxs/prior Treatment) HPI  Past Medical History  Diagnosis Date  . Asthma   . Hypertension   . Fibromyalgia   . Neurogenic bladder   . Back pain, chronic   . Panic attacks   .  Reflux   . Morbid obesity    Past Surgical History  Procedure Laterality Date  . Back surgery    . Carpal tunnel release    . Ureteral stent placement  2008  . Bladder stimulator     No family history on file. History  Substance Use Topics  . Smoking status: Former Research scientist (life sciences)  . Smokeless tobacco: Never Used     Comment: quit 2004  . Alcohol Use: No   OB History    Gravida Para Term Preterm AB TAB SAB Ectopic Multiple Living   6 4 4  0 2 0 2 0 0 4     Review of Systems  Constitutional: Negative for fever and chills.  Cardiovascular: Positive for chest pain.  Genitourinary: Negative for dysuria, urgency, frequency, hematuria, flank pain, decreased urine volume, vaginal bleeding, vaginal discharge and difficulty urinating.  Neurological: Negative for dizziness and weakness.  All other systems reviewed and are negative.     Allergies  Clindamycin/lincomycin; Ibuprofen; and Tomato  Home Medications   Prior to Admission medications   Medication Sig Start Date End Date Taking? Authorizing Provider  albuterol (PROVENTIL HFA;VENTOLIN HFA) 108 (90 BASE) MCG/ACT inhaler Inhale 2 puffs into the lungs every 6 (six) hours as needed. Takes for shortness of breath   Yes Historical Provider, MD  albuterol (PROVENTIL HFA;VENTOLIN HFA) 108 (90 BASE) MCG/ACT inhaler Inhale 2 puffs into the lungs every 4 (four) hours as needed for wheezing or shortness of breath. 07/24/13  Yes [place], DO  ALPRAZolam Duanne Moron)  0.5 MG tablet Take 0.5 mg by mouth at bedtime as needed. Takes for depression   Yes Historical Provider, MD  budesonide-formoterol (SYMBICORT) 160-4.5 MCG/ACT inhaler Inhale 2 puffs into the lungs 2 (two) times daily.   Yes Historical Provider, MD  cyclobenzaprine (FLEXERIL) 10 MG tablet Take 10 mg by mouth 3 (three) times daily as needed. Takes for muscle spasms   Yes Historical Provider, MD  diclofenac sodium (VOLTAREN) 1 % GEL Apply 2 g topically 4 (four) times daily.   Yes  Historical Provider, MD  furosemide (LASIX) 40 MG tablet Take 1 tablet (40 mg total) by mouth daily. 05/09/12  Yes Verlee Monte, MD  guaiFENesin (MUCINEX) 600 MG 12 hr tablet Take 2 tablets (1,200 mg total) by mouth 2 (two) times daily. 05/09/12  Yes Verlee Monte, MD  ipratropium (ATROVENT) 0.06 % nasal spray Place 2 sprays into the nose 4 (four) times daily. 03/04/13  Yes Billy Fischer, MD  losartan (COZAAR) 100 MG tablet Take 100 mg by mouth daily.   Yes Historical Provider, MD  losartan (COZAAR) 25 MG tablet Take 25 mg by mouth daily.   Yes Historical Provider, MD  montelukast (SINGULAIR) 10 MG tablet Take 10 mg by mouth at bedtime.   Yes Historical Provider, MD  OMEPRAZOLE PO Take 1 tablet by mouth daily.   Yes Historical Provider, MD  oxyCODONE-acetaminophen (PERCOCET) 10-325 MG per tablet Take 1 tablet by mouth every 4 (four) hours as needed. Takes for pain   Yes Historical Provider, MD  oxymorphone (OPANA ER) 20 MG 12 hr tablet Take 20 mg by mouth every 12 (twelve) hours.   Yes Historical Provider, MD  pregabalin (LYRICA) 75 MG capsule Take 75 mg by mouth 3 (three) times daily.   Yes Historical Provider, MD  azithromycin (ZITHROMAX) 250 MG tablet Take 1 tablet (250 mg total) by mouth daily. 07/24/13   Kristen N Ward, DO  benzonatate (TESSALON) 100 MG capsule Take 1 capsule (100 mg total) by mouth every 8 (eight) hours. 04/27/14   Verda Cumins Shay Bartoli, PA-C  dextromethorphan (DELSYM) 30 MG/5ML liquid Take 10 mLs (60 mg total) by mouth 2 (two) times daily. 03/04/13   Billy Fischer, MD  diphenhydrAMINE (BENADRYL) 25 MG tablet Take 1 tablet (25 mg total) by mouth every 6 (six) hours as needed for itching or allergies. 04/27/14   Verda Cumins [place] Hert, PA-C  Phenyleph-Promethazine-Cod 5-6.25-10 MG/5ML SYRP Take 5 mLs by mouth every 6 (six) hours as needed. 05/19/13   Hope Bunnie Pion, NP  predniSONE (DELTASONE) 20 MG tablet Take 3 tablets (60 mg total) by mouth daily. 07/24/13   Kristen N Ward, DO  predniSONE  (STERAPRED UNI-PAK) 10 MG tablet Take 6-5-4-3-2-1 tabs PO daily till gone 05/09/12   Mutaz Elmahi, MD   BP 121/78 mmHg  Pulse 78  Temp(Src) 97.9 F (36.6 C) (Oral)  Resp 18  Ht 5' (1.524 m)  Wt 320 lb (145.151 kg)  BMI 62.50 kg/m2  SpO2 99%  LMP 04/15/2014 (Approximate) Physical Exam  Constitutional: She appears well-developed and well-nourished. No distress.  Nontoxic appearing obese female.  HENT:  Head: Normocephalic and atraumatic.  Right Ear: External ear normal.  Left Ear: External ear normal.  Nose: Nose normal.  Mouth/Throat: Oropharynx is clear and moist and mucous membranes are normal. No oral lesions. No trismus in the jaw. No uvula swelling. No oropharyngeal exudate, posterior oropharyngeal edema, posterior oropharyngeal erythema or tonsillar abscesses.  Bilateral tympanic membranes are pearly-gray without erythema or loss of landmarks.  The patient's uvula is midline without edema or erythema. There is no oropharyngeal edema or erythema noted  Eyes: Conjunctivae are normal. Pupils are equal, round, and reactive to light. Right eye exhibits no discharge. Left eye exhibits no discharge.  Neck: Normal range of motion. Neck supple.  Cardiovascular: Normal rate, regular rhythm, normal heart sounds and intact distal pulses.  Exam reveals no gallop and no friction rub.   No murmur heard. Bilateral radial pulses are intact. Bilateral posterior tibialis pulses are intact.  Pulmonary/Chest: Effort normal and breath sounds normal. No respiratory distress. She has no wheezes. She has no rales. She exhibits tenderness.  Patient's left anterior chest wall is tender to palpation reproduces her chest pain.  Abdominal: Soft. She exhibits no distension. There is no tenderness.  Musculoskeletal: She exhibits no edema or tenderness.  There is no lower extremity edema noted. Patient's calves are equal in size. There is no calf tenderness noted bilaterally.  Lymphadenopathy:    She has no  cervical adenopathy.  Neurological: She is alert. Coordination normal.  Skin: Skin is warm and dry. No rash noted. She is not diaphoretic. No erythema. No pallor.  Psychiatric: She has a normal mood and affect. Her behavior is normal.  Nursing note and vitals reviewed.   ED Course  Procedures (including critical care time) Labs Review Labs Reviewed  BASIC METABOLIC PANEL - Abnormal; Notable for the following:    GFR calc non Af Amer 89 (*)    All other components within normal limits  CBC WITH DIFFERENTIAL - Abnormal; Notable for the following:    Hemoglobin 11.8 (*)    All other components within normal limits  TROPONIN I    Imaging Review Dg Chest 2 View  04/27/2014   CLINICAL DATA:  Chest pain.  EXAM: CHEST  2 VIEW  COMPARISON:  July 24, 2013.  FINDINGS: The heart size and mediastinal contours are within normal limits. Both lungs are clear. No pneumothorax or pleural effusion is noted. The visualized skeletal structures are unremarkable.  IMPRESSION: No acute cardiopulmonary abnormality seen.   Electronically Signed   By: Sabino Dick M.D.   On: 04/27/2014 18:26     EKG Interpretation   Date/Time:  Saturday April 27 2014 16:35:33 EST Ventricular Rate:  85 PR Interval:  132 QRS Duration: 72 QT Interval:  384 QTC Calculation: 456 R Axis:   23 Text Interpretation:  Normal sinus rhythm Low voltage QRS Borderline ECG  Confirmed by Hazle Coca [phone]) on 04/27/2014 4:55:55 PM      Filed Vitals:   04/27/14 1624 04/27/14 1700 04/27/14 1839 04/27/14 1933  BP: 163/74 132/78 118/71 121/78  Pulse: 101 85 84 78  Temp: 97.9 F (36.6 C)     TempSrc: Oral     Resp: 22  20 18   Height: 5' (1.524 m)     Weight: 320 lb (145.151 kg)     SpO2: 99% 98% 100% 99%     MDM   Meds given in ED:  Medications  diphenhydrAMINE (BENADRYL) capsule 25 mg (25 mg Oral Given 04/27/14 1751)    Discharge Medication List as of 04/27/2014  7:26 PM    START taking these medications   Details   benzonatate (TESSALON) 100 MG capsule Take 1 capsule (100 mg total) by mouth every 8 (eight) hours., Starting 04/27/2014, Until Discontinued, Print    diphenhydrAMINE (BENADRYL) 25 MG tablet Take 1 tablet (25 mg total) by mouth every 6 (six) hours as needed for itching or allergies.,  Starting 04/27/2014, Until Discontinued, Print        Final diagnoses:  Atypical chest pain  Upper respiratory infection  Food allergy   this is a 44 year old morbidly obese female who presented to the ED with multiple complaints including possible allergic reaction to tomatoes and left-sided chest pain. Patient reported that last night she ate a salad that had tomatoes on it which she is allergic to. The patient reported swelling to her lips and throat. Patient is able to tolerate by mouth liquids currently. The patient has no difficulty breathing. The patient has attempted treatments. Patient also is complaining of a left sided chest pain that radiates to her left arm for the past 4 days. Lesions chest is tender to palpation and reproduces her symptoms. The patient is also complaining of an intermittent cough for the past 3 days. Patient is afebrile and nontoxic appearing. The patient is not tachypneic or tachycardic. The patient is not hypoxic. The patient is drinking water and juice prior to discharge. The patient's BMP and CBC are unremarkable. The patient has negative troponin. The patient's chest x-ray is unremarkable. I feel this patient is safe to be discharged at this time. Prescriptions for Tessalon Perles and Benadryl provided. Advised patient to follow-up with her primary care provider next week. Strict return precautions were provided. Advised the patient return to emergency department with new worsening symptoms or new concerns. The patient verbalized understanding and agreement with plan.  This patient was discussed Dr. Ralene Bathe who agrees with assessment and plan.    Hanley Hays, PA-C 04/28/14  0157  Quintella Reichert, MD 04/28/14 [phone]

## 2014-04-27 NOTE — ED Notes (Signed)
Pt reports rash to face, lip swelling and throat hoarse since last night- ?allergic reaction

## 2014-04-27 NOTE — ED Notes (Signed)
Pt also c/o left chest pain radiating to left arm- taken to room 4 for EKG

## 2014-04-27 NOTE — Discharge Instructions (Signed)
Chest Wall Pain Chest wall pain is pain in or around the bones and muscles of your chest. It may take up to 6 weeks to get better. It may take longer if you must stay physically active in your work and activities.  CAUSES  Chest wall pain may happen on its own. However, it may be caused by:  A viral illness like the flu.  Injury.  Coughing.  Exercise.  Arthritis.  Fibromyalgia.  Shingles. HOME CARE INSTRUCTIONS   Avoid overtiring physical activity. Try not to strain or perform activities that cause pain. This includes any activities using your chest or your abdominal and side muscles, especially if heavy weights are used.  Put ice on the sore area.  Put ice in a plastic bag.  Place a towel between your skin and the bag.  Leave the ice on for 15-20 minutes per hour while awake for the first 2 days.  Only take over-the-counter or prescription medicines for pain, discomfort, or fever as directed by your caregiver. SEEK IMMEDIATE MEDICAL CARE IF:   Your pain increases, or you are very uncomfortable.  You have a fever.  Your chest pain becomes worse.  You have new, unexplained symptoms.  You have nausea or vomiting.  You feel sweaty or lightheaded.  You have a cough with phlegm (sputum), or you cough up blood. MAKE SURE YOU:   Understand these instructions.  Will watch your condition.  Will get help right away if you are not doing well or get worse. Document Released: 04/12/2005 Document Revised: 07/05/2011 Document Reviewed: 12/07/2010 [facility] Patient Information 2015 [place], [place]. This information is not intended to replace advice given to you by your health care provider. Make sure you discuss any questions you have with your health care provider. Upper Respiratory Infection, Adult An upper respiratory infection (URI) is also sometimes known as the common cold. The upper respiratory tract includes the nose, sinuses, throat, trachea, and bronchi. Bronchi are  the airways leading to the lungs. Most people improve within 1 week, but symptoms can last up to 2 weeks. A residual cough may last even longer.  CAUSES Many different viruses can infect the tissues lining the upper respiratory tract. The tissues become irritated and inflamed and often become very moist. Mucus production is also common. A cold is contagious. You can easily spread the virus to others by oral contact. This includes kissing, sharing a glass, coughing, or sneezing. Touching your mouth or nose and then touching a surface, which is then touched by another person, can also spread the virus. SYMPTOMS  Symptoms typically develop 1 to 3 days after you come in contact with a cold virus. Symptoms vary from person to person. They may include:  Runny nose.  Sneezing.  Nasal congestion.  Sinus irritation.  Sore throat.  Loss of voice (laryngitis).  Cough.  Fatigue.  Muscle aches.  Loss of appetite.  Headache.  Low-grade fever. DIAGNOSIS  You might diagnose your own cold based on familiar symptoms, since most people get a cold 2 to 3 times a year. Your caregiver can confirm this based on your exam. Most importantly, your caregiver can check that your symptoms are not due to another disease such as strep throat, sinusitis, pneumonia, asthma, or epiglottitis. Blood tests, throat tests, and X-rays are not necessary to diagnose a common cold, but they may sometimes be helpful in excluding other more serious diseases. Your caregiver will decide if any further tests are required. RISKS AND COMPLICATIONS  You may  be at risk for a more severe case of the common cold if you smoke cigarettes, have chronic heart disease (such as heart failure) or lung disease (such as asthma), or if you have a weakened immune system. The very young and very old are also at risk for more serious infections. Bacterial sinusitis, middle ear infections, and bacterial pneumonia can complicate the common cold. The  common cold can worsen asthma and chronic obstructive pulmonary disease (COPD). Sometimes, these complications can require emergency medical care and may be life-threatening. °PREVENTION  °The best way to protect against getting a cold is to practice good hygiene. Avoid oral or hand contact with people with cold symptoms. Wash your hands often if contact occurs. There is no clear evidence that vitamin C, vitamin E, echinacea, or exercise reduces the chance of developing a cold. However, it is always recommended to get plenty of rest and practice good nutrition. °TREATMENT  °Treatment is directed at relieving symptoms. There is no cure. Antibiotics are not effective, because the infection is caused by a virus, not by bacteria. Treatment may include: °· Increased fluid intake. Sports drinks offer valuable electrolytes, sugars, and fluids. °· Breathing heated mist or steam (vaporizer or shower). °· Eating chicken soup or other clear broths, and maintaining good nutrition. °· Getting plenty of rest. °· Using gargles or lozenges for comfort. °· Controlling fevers with ibuprofen or acetaminophen as directed by your caregiver. °· Increasing usage of your inhaler if you have asthma. °Zinc gel and zinc lozenges, taken in the first 24 hours of the common cold, can shorten the duration and lessen the severity of symptoms. Pain medicines may help with fever, muscle aches, and throat pain. A variety of non-prescription medicines are available to treat congestion and runny nose. Your caregiver can make recommendations and may suggest nasal or lung inhalers for other symptoms.  °HOME CARE INSTRUCTIONS  °· Only take over-the-counter or prescription medicines for pain, discomfort, or fever as directed by your caregiver. °· Use a warm mist humidifier or inhale steam from a shower to increase air moisture. This may keep secretions moist and make it easier to breathe. °· Drink enough water and fluids to keep your urine clear or pale  yellow. °· Rest as needed. °· Return to work when your temperature has returned to normal or as your caregiver advises. You may need to stay home longer to avoid infecting others. You can also use a face mask and careful hand washing to prevent spread of the virus. °SEEK MEDICAL CARE IF:  °· After the first few days, you feel you are getting worse rather than better. °· You need your caregiver's advice about medicines to control symptoms. °· You develop chills, worsening shortness of breath, or brown or red sputum. These may be signs of pneumonia. °· You develop yellow or brown nasal discharge or pain in the face, especially when you bend forward. These may be signs of sinusitis. °· You develop a fever, swollen neck glands, pain with swallowing, or white areas in the back of your throat. These may be signs of strep throat. °SEEK IMMEDIATE MEDICAL CARE IF:  °· You have a fever. °· You develop severe or persistent headache, ear pain, sinus pain, or chest pain. °· You develop wheezing, a prolonged cough, cough up blood, or have a change in your usual mucus (if you have chronic lung disease). °· You develop sore muscles or a stiff neck. °Document Released: 10/06/2000 Document Revised: 07/05/2011 Document Reviewed: 07/18/2013 °[employer]® Patient   Information 2015 [place], [place]. This information is not intended to replace advice given to you by your health care provider. Make sure you discuss any questions you have with your health care provider. Food Allergy A food allergy occurs from eating something you are sensitive to. Food allergies occur in all age groups. It may be passed to you from your parents (heredity).  CAUSES  Some common causes are cow's milk, seafood, eggs, nuts (including peanut butter), wheat, and soybeans. SYMPTOMS  Common problems are:   Swelling around the mouth.  An itchy, red rash.  Hives.  Vomiting.  Diarrhea. Severe allergic reactions are life-threatening. This reaction is called  anaphylaxis. It can cause the mouth and throat to swell. This makes it hard to breathe and swallow. In severe reactions, only a small amount of food may be fatal within seconds. HOME CARE INSTRUCTIONS   If you are unsure what caused the reaction, keep a diary of foods eaten and symptoms that followed. Avoid foods that cause reactions.  If hives or rash are present:  Take medicines as directed.  Use an over-the-counter antihistamine (diphenhydramine) to treat hives and itching as needed.  Apply cold compresses to the skin or take baths in cool water. Avoid hot baths or showers. These will increase the redness and itching.  If you are severely allergic:  Hospitalization is often required following a severe reaction.  Wear a medical alert bracelet or necklace that describes the allergy.  Carry your anaphylaxis kit or epinephrine injection with you at all times. Both you and your family members should know how to use this. This can be lifesaving if you have a severe reaction. If epinephrine is used, it is important for you to seek immediate medical care or call your local emergency services (911 in [place] When the epinephrine wears off, it can be followed by a delayed reaction, which can be fatal.  Replace your epinephrine immediately after use in case of another reaction.  Ask your caregiver for instructions if you have not been taught how to use an epinephrine injection.  Do not drive until medicines used to treat the reaction have worn off, unless approved by your caregiver. SEEK MEDICAL CARE IF:   You suspect a food allergy. Symptoms generally happen within 30 minutes of eating a food.  Your symptoms have not gone away within 2 days. See your caregiver sooner if symptoms are getting worse.  You develop new symptoms.  You want to retest yourself with a food or drink you think causes an allergic reaction. Never do this if an anaphylactic reaction to that food or drink has happened  before.  There is a return of the symptoms which brought you to your caregiver. SEEK IMMEDIATE MEDICAL CARE IF:   You have trouble breathing, are wheezing, or you have a tight feeling in your chest or throat.  You have a swollen mouth, or you have hives, swelling, or itching all over your body. Use your epinephrine injection immediately. This is given into the outside of your thigh, deep into the muscle. Following use of the epinephrine injection, seek help right away. Seek immediate medical care or call your local emergency services (911 in [place] MAKE SURE YOU:   Understand these instructions.  Will watch your condition.  Will get help right away if you are not doing well or get worse. Document Released: 04/09/2000 Document Revised: 07/05/2011 Document Reviewed: 11/30/2007 [facility] Patient Information 2015 [place], [place]. This information is not intended to replace advice given  to you by your health care provider. Make sure you discuss any questions you have with your health care provider.

## 2014-05-04 ENCOUNTER — Encounter (HOSPITAL_BASED_OUTPATIENT_CLINIC_OR_DEPARTMENT_OTHER): Payer: Self-pay | Admitting: Emergency Medicine

## 2014-05-04 ENCOUNTER — Emergency Department (HOSPITAL_BASED_OUTPATIENT_CLINIC_OR_DEPARTMENT_OTHER): Payer: Medicaid Other

## 2014-05-04 ENCOUNTER — Emergency Department (HOSPITAL_BASED_OUTPATIENT_CLINIC_OR_DEPARTMENT_OTHER)
Admission: EM | Admit: 2014-05-04 | Discharge: 2014-05-05 | Disposition: A | Discharge status: Home / Self Care | Payer: Medicaid Other | Source: Home / Self Care | Attending: Emergency Medicine | Admitting: Emergency Medicine

## 2014-05-04 DIAGNOSIS — Z87448 Personal history of other diseases of urinary system: Secondary | ICD-10-CM | POA: Diagnosis not present

## 2014-05-04 DIAGNOSIS — R000 Tachycardia, unspecified: Secondary | ICD-10-CM | POA: Diagnosis not present

## 2014-05-04 DIAGNOSIS — I10 Essential (primary) hypertension: Secondary | ICD-10-CM

## 2014-05-04 DIAGNOSIS — Z7952 Long term (current) use of systemic steroids: Secondary | ICD-10-CM | POA: Insufficient documentation

## 2014-05-04 DIAGNOSIS — Z7951 Long term (current) use of inhaled steroids: Secondary | ICD-10-CM | POA: Insufficient documentation

## 2014-05-04 DIAGNOSIS — Z792 Long term (current) use of antibiotics: Secondary | ICD-10-CM

## 2014-05-04 DIAGNOSIS — F410 Panic disorder [episodic paroxysmal anxiety] without agoraphobia: Secondary | ICD-10-CM | POA: Insufficient documentation

## 2014-05-04 DIAGNOSIS — R079 Chest pain, unspecified: Secondary | ICD-10-CM | POA: Insufficient documentation

## 2014-05-04 DIAGNOSIS — Z791 Long term (current) use of non-steroidal anti-inflammatories (NSAID): Secondary | ICD-10-CM

## 2014-05-04 DIAGNOSIS — J4520 Mild intermittent asthma, uncomplicated: Secondary | ICD-10-CM | POA: Insufficient documentation

## 2014-05-04 DIAGNOSIS — J159 Unspecified bacterial pneumonia: Secondary | ICD-10-CM | POA: Insufficient documentation

## 2014-05-04 DIAGNOSIS — J189 Pneumonia, unspecified organism: Secondary | ICD-10-CM

## 2014-05-04 DIAGNOSIS — Z87891 Personal history of nicotine dependence: Secondary | ICD-10-CM | POA: Diagnosis not present

## 2014-05-04 DIAGNOSIS — R059 Cough, unspecified: Secondary | ICD-10-CM

## 2014-05-04 DIAGNOSIS — K219 Gastro-esophageal reflux disease without esophagitis: Secondary | ICD-10-CM | POA: Insufficient documentation

## 2014-05-04 DIAGNOSIS — Z79899 Other long term (current) drug therapy: Secondary | ICD-10-CM | POA: Insufficient documentation

## 2014-05-04 DIAGNOSIS — M797 Fibromyalgia: Secondary | ICD-10-CM | POA: Insufficient documentation

## 2014-05-04 DIAGNOSIS — R05 Cough: Secondary | ICD-10-CM

## 2014-05-04 DIAGNOSIS — J45901 Unspecified asthma with (acute) exacerbation: Secondary | ICD-10-CM | POA: Diagnosis not present

## 2014-05-04 DIAGNOSIS — N319 Neuromuscular dysfunction of bladder, unspecified: Secondary | ICD-10-CM | POA: Insufficient documentation

## 2014-05-04 DIAGNOSIS — G8929 Other chronic pain: Secondary | ICD-10-CM

## 2014-05-04 NOTE — ED Notes (Signed)
PT presents to ED with complaints of fever and cough

## 2014-05-05 MED ORDER — AZITHROMYCIN 250 MG PO TABS: 250.0000 mg | ORAL_TABLET | Freq: Every day | ORAL | Status: DC

## 2014-05-05 MED ORDER — AZITHROMYCIN 250 MG PO TABS
500.0000 mg | ORAL_TABLET | Freq: Once | ORAL | Status: AC
  Administered 2014-05-05: 500 mg via ORAL
  Filled 2014-05-05: qty 2

## 2014-05-05 MED ORDER — ALBUTEROL SULFATE (2.5 MG/3ML) 0.083% IN NEBU
5.0000 mg | INHALATION_SOLUTION | Freq: Once | RESPIRATORY_TRACT | Status: AC
  Administered 2014-05-05: 5 mg via RESPIRATORY_TRACT
  Filled 2014-05-05: qty 6

## 2014-05-05 NOTE — Discharge Instructions (Signed)
If you were given medicines take as directed.  If you are on coumadin or contraceptives realize their levels and effectiveness is altered by many different medicines.  If you have any reaction (rash, tongues swelling, other) to the medicines stop taking and see a physician.   Have blood pressure rechecked next week.  Please follow up as directed and return to the ER or see a physician for new or worsening symptoms.  Thank you. Filed Vitals:   05/04/14 2213 05/05/14 0015 05/05/14 0019 05/05/14 0023  BP: 184/102 146/95  150/89  Pulse:  90  84  Temp:    98.9 F (37.2 C)  TempSrc:    Oral  Resp:    20  Height:      Weight:      SpO2:  100% 100% 100%

## 2014-05-05 NOTE — ED Provider Notes (Signed)
CSN: [id number]     Arrival date & time 05/04/14  2206 History  This chart was scribe for Mariea Clonts, MD by Judithann Sauger, ED Scribe. The patient was seen in room MH05/MH05 and the patient's care was started at 12:26 AM.      Chief Complaint  Patient presents with  . Cough  . Fever   Patient is a 44 y.o. female presenting with cough and fever. The history is provided by the patient. No language interpreter was used.  Cough Cough characteristics:  Productive Sputum characteristics:  Clear, brown and green Severity:  Severe Onset quality:  Gradual Duration:  1 week Timing:  Intermittent Progression:  Worsening Context: sick contacts   Relieved by:  Nothing Associated symptoms: chest pain, chills, fever and sore throat   Associated symptoms: no shortness of breath   Risk factors: no recent travel   Fever Associated symptoms: chest pain, chills, cough and sore throat    HPI Comments: Felicia Molina is a 44 y.o. female with a hx of asthma who presents to the Emergency Department complaining of a productive cough onset 03/28/2015. She reports that she was seen here on 04/27/2014 but her symptoms are getting worse. She explains that her sputums went from green to brown to white now. She reports associated fever yesterday, chills today, and a sore throat. She reports a sick contact at home. She states that she took Mucinex with no relief. She denies traveling outside the country recently. She reports that she is allergic to clindamycin and ibuprofen.   Past Medical History  Diagnosis Date  . Asthma   . Hypertension   . Fibromyalgia   . Neurogenic bladder   . Back pain, chronic   . Panic attacks   . Reflux   . Morbid obesity    Past Surgical History  Procedure Laterality Date  . Back surgery    . Carpal tunnel release    . Ureteral stent placement  2008  . Bladder stimulator     No family history on file. History  Substance Use Topics  . Smoking status: Former Research scientist (life sciences)  .  Smokeless tobacco: Never Used     Comment: quit 2004  . Alcohol Use: No   OB History    Gravida Para Term Preterm AB TAB SAB Ectopic Multiple Living   6 4 4  0 2 0 2 0 0 4     Review of Systems  Constitutional: Positive for fever and chills.  HENT: Positive for sore throat.   Respiratory: Positive for cough. Negative for shortness of breath.   Cardiovascular: Positive for chest pain.  All other systems reviewed and are negative.     Allergies  Clindamycin/lincomycin; Ibuprofen; and Tomato  Home Medications   Prior to Admission medications   Medication Sig Start Date End Date Taking? Authorizing Provider  albuterol (PROVENTIL HFA;VENTOLIN HFA) 108 (90 BASE) MCG/ACT inhaler Inhale 2 puffs into the lungs every 6 (six) hours as needed. Takes for shortness of breath    Historical Provider, MD  albuterol (PROVENTIL HFA;VENTOLIN HFA) 108 (90 BASE) MCG/ACT inhaler Inhale 2 puffs into the lungs every 4 (four) hours as needed for wheezing or shortness of breath. 07/24/13   Kristen N Ward, DO  ALPRAZolam (XANAX) 0.5 MG tablet Take 0.5 mg by mouth at bedtime as needed. Takes for depression    Historical Provider, MD  azithromycin (ZITHROMAX) 250 MG tablet Take 1 tablet (250 mg total) by mouth daily. 07/24/13   Falcon, DO  benzonatate (TESSALON) 100 MG capsule Take 1 capsule (100 mg total) by mouth every 8 (eight) hours. 04/27/14   Verda Cumins Dansie, PA-C  budesonide-formoterol (SYMBICORT) 160-4.5 MCG/ACT inhaler Inhale 2 puffs into the lungs 2 (two) times daily.    Historical Provider, MD  cyclobenzaprine (FLEXERIL) 10 MG tablet Take 10 mg by mouth 3 (three) times daily as needed. Takes for muscle spasms    Historical Provider, MD  dextromethorphan (DELSYM) 30 MG/5ML liquid Take 10 mLs (60 mg total) by mouth 2 (two) times daily. 03/04/13   Billy Fischer, MD  diclofenac sodium (VOLTAREN) 1 % GEL Apply 2 g topically 4 (four) times daily.    Historical Provider, MD  diphenhydrAMINE  (BENADRYL) 25 MG tablet Take 1 tablet (25 mg total) by mouth every 6 (six) hours as needed for itching or allergies. 04/27/14   Verda Cumins Dansie, PA-C  furosemide (LASIX) 40 MG tablet Take 1 tablet (40 mg total) by mouth daily. 05/09/12   Verlee Monte, MD  guaiFENesin (MUCINEX) 600 MG 12 hr tablet Take 2 tablets (1,200 mg total) by mouth 2 (two) times daily. 05/09/12   Verlee Monte, MD  ipratropium (ATROVENT) 0.06 % nasal spray Place 2 sprays into the nose 4 (four) times daily. 03/04/13   Billy Fischer, MD  losartan (COZAAR) 100 MG tablet Take 100 mg by mouth daily.    Historical Provider, MD  losartan (COZAAR) 25 MG tablet Take 25 mg by mouth daily.    Historical Provider, MD  montelukast (SINGULAIR) 10 MG tablet Take 10 mg by mouth at bedtime.    Historical Provider, MD  OMEPRAZOLE PO Take 1 tablet by mouth daily.    Historical Provider, MD  oxyCODONE-acetaminophen (PERCOCET) 10-325 MG per tablet Take 1 tablet by mouth every 4 (four) hours as needed. Takes for pain    Historical Provider, MD  oxymorphone (OPANA ER) 20 MG 12 hr tablet Take 20 mg by mouth every 12 (twelve) hours.    Historical Provider, MD  Phenyleph-Promethazine-Cod 5-6.25-10 MG/5ML SYRP Take 5 mLs by mouth every 6 (six) hours as needed. 05/19/13   Hope Bunnie Pion, NP  predniSONE (DELTASONE) 20 MG tablet Take 3 tablets (60 mg total) by mouth daily. 07/24/13   Kristen N Ward, DO  predniSONE (STERAPRED UNI-PAK) 10 MG tablet Take 6-5-4-3-2-1 tabs PO daily till gone 05/09/12   Verlee Monte, MD  pregabalin (LYRICA) 75 MG capsule Take 75 mg by mouth 3 (three) times daily.    Historical Provider, MD   BP 150/89 mmHg  Pulse 84  Temp(Src) 98.9 F (37.2 C) (Oral)  Resp 20  Ht 5' (1.524 m)  Wt 320 lb (145.151 kg)  BMI 62.50 kg/m2  SpO2 100%  LMP 04/15/2014 (Approximate) Physical Exam  Constitutional: She is oriented to person, place, and time. She appears well-developed and well-nourished. No distress.  HENT:  Head: Normocephalic and  atraumatic.  Mouth/Throat: No oropharyngeal exudate.  No unilateral swelling    Eyes: Conjunctivae and EOM are normal.  Neck: Neck supple. No tracheal deviation present.  No meningismus   Cardiovascular: Normal rate.   Pulmonary/Chest: Effort normal. No respiratory distress. She has no wheezes.  Lungs are clear  Musculoskeletal: Normal range of motion.  Lymphadenopathy:    She has cervical adenopathy (Anterior ).  Neurological: She is alert and oriented to person, place, and time.  Skin: Skin is warm and dry.  Psychiatric: She has a normal mood and affect. Her behavior is normal.  Nursing note and vitals  reviewed.   ED Course  Procedures (including critical care time) DIAGNOSTIC STUDIES: Oxygen Saturation is 100% on RA, normal by my interpretation.    COORDINATION OF CARE: 12:40 AM- Pt advised of plan for treatment and pt agrees.    Labs Review Labs Reviewed - No data to display  Imaging Review Dg Chest 2 View  05/04/2014   CLINICAL DATA:  Shortness of breath for 1 week.  EXAM: CHEST  2 VIEW  COMPARISON:  Chest radiograph 04/27/2014  FINDINGS: Stable cardiac and mediastinal contours. No consolidative pulmonary opacities. No pleural effusion or pneumothorax. Regional skeleton is unremarkable.  IMPRESSION: No acute cardiopulmonary process.   Electronically Signed   By: Lovey Newcomer M.D.   On: 05/04/2014 22:42     EKG Interpretation None      MDM   Final diagnoses:  CAP (community acquired pneumonia)    I personally performed the services described in this documentation, which was scribed in my presence. The recorded information has been reviewed and is accurate.   Patient with clinical concern for influenza versus Mucor pneumonia. Chest x-ray reviewed and lateral view concerning for infiltrate. Oral antibiotics and close follow-up outpatient. Patient not in respiratory distress not requiring oxygen.  Results and differential diagnosis were discussed with the  patient/parent/guardian. Close follow up outpatient was discussed, comfortable with the plan.   Medications  albuterol (PROVENTIL) (2.5 MG/3ML) 0.083% nebulizer solution 5 mg (5 mg Nebulization Given 05/05/14 0019)  azithromycin (ZITHROMAX) tablet 500 mg (500 mg Oral Given 05/05/14 0113)    Filed Vitals:   05/05/14 0019 05/05/14 0023 05/05/14 0112 05/05/14 0115  BP:  150/89 165/99   Pulse:  84  105  Temp:  98.9 F (37.2 C)    TempSrc:  Oral    Resp:  20    Height:      Weight:      SpO2: 100% 100%  100%    Final diagnoses:  CAP (community acquired pneumonia)  Asthma, mild intermittent, uncomplicated      Mariea Clonts, MD 05/05/14 [phone]

## 2014-05-05 NOTE — ED Notes (Signed)
States ive being treated for pneumonia.  Now I'm having sore throat, chest pain on the left when I cough and spitting up green mucous. Also feeling SOB.  Did a breathing treatment PTA.  Has not eaten or drank much for 48 hours.

## 2014-05-06 ENCOUNTER — Encounter (HOSPITAL_COMMUNITY): Payer: Self-pay | Admitting: Emergency Medicine

## 2014-05-06 ENCOUNTER — Emergency Department (HOSPITAL_COMMUNITY)
Admission: EM | Admit: 2014-05-06 | Discharge: 2014-05-06 | Disposition: A | Discharge status: Home / Self Care | Payer: Medicaid Other | Attending: Emergency Medicine | Admitting: Emergency Medicine

## 2014-05-06 DIAGNOSIS — J189 Pneumonia, unspecified organism: Secondary | ICD-10-CM

## 2014-05-06 LAB — I-STAT TROPONIN, ED: Troponin i, poc: 0 ng/mL (ref 0.00–0.08)

## 2014-05-06 LAB — CBC
HCT: 37.9 % (ref 36.0–46.0)
Hemoglobin: 12 g/dL (ref 12.0–15.0)
MCH: 27.8 pg (ref 26.0–34.0)
MCHC: 31.7 g/dL (ref 30.0–36.0)
MCV: 87.9 fL (ref 78.0–100.0)
Platelets: 358 10*3/uL (ref 150–400)
RBC: 4.31 MIL/uL (ref 3.87–5.11)
RDW: 14.2 % (ref 11.5–15.5)
WBC: 11.3 10*3/uL — AB (ref 4.0–10.5)

## 2014-05-06 LAB — BASIC METABOLIC PANEL
Anion gap: 11 (ref 5–15)
BUN: 8 mg/dL (ref 6–23)
CO2: 22 mmol/L (ref 19–32)
Calcium: 9.5 mg/dL (ref 8.4–10.5)
Chloride: 108 mEq/L (ref 96–112)
Creatinine, Ser: 0.95 mg/dL (ref 0.50–1.10)
GFR calc Af Amer: 84 mL/min — ABNORMAL LOW (ref >90)
GFR calc non Af Amer: 72 mL/min — ABNORMAL LOW (ref >90)
GLUCOSE: 112 mg/dL — AB (ref 70–99)
POTASSIUM: 3.8 mmol/L (ref 3.5–5.1)
SODIUM: 141 mmol/L (ref 135–145)

## 2014-05-06 MED ORDER — AZITHROMYCIN 250 MG PO TABS
500.0000 mg | ORAL_TABLET | Freq: Once | ORAL | Status: AC
  Administered 2014-05-06: 500 mg via ORAL
  Filled 2014-05-06: qty 2

## 2014-05-06 MED ORDER — IPRATROPIUM BROMIDE 0.02 % IN SOLN
0.5000 mg | Freq: Once | RESPIRATORY_TRACT | Status: AC
  Administered 2014-05-06: 0.5 mg via RESPIRATORY_TRACT
  Filled 2014-05-06: qty 2.5

## 2014-05-06 MED ORDER — ALBUTEROL SULFATE (2.5 MG/3ML) 0.083% IN NEBU
5.0000 mg | INHALATION_SOLUTION | Freq: Once | RESPIRATORY_TRACT | Status: AC
  Administered 2014-05-06: 5 mg via RESPIRATORY_TRACT
  Filled 2014-05-06: qty 6

## 2014-05-06 MED ORDER — SODIUM CHLORIDE 0.9 % IV BOLUS (SEPSIS)
1000.0000 mL | Freq: Once | INTRAVENOUS | Status: AC
  Administered 2014-05-06: 1000 mL via INTRAVENOUS

## 2014-05-06 NOTE — ED Notes (Signed)
Ed pa at the bedside pt alert

## 2014-05-06 NOTE — Progress Notes (Signed)
ED CM received call for prescription clarification from [facility] on [address]. No further CM needs identified.

## 2014-05-06 NOTE — Discharge Instructions (Signed)
Take your medication as prescribed. Drink plenty of fluids to stay hydrated. Follow up with your doctor as needed.

## 2014-05-06 NOTE — ED Provider Notes (Signed)
CSN: [id number]     Arrival date & time 05/05/14  2350 History   First MD Initiated Contact with Patient 05/06/14 0127     Chief Complaint  Patient presents with  . Chest Pain  . Shortness of Breath     (Consider location/radiation/quality/duration/timing/severity/associated sxs/prior Treatment) HPI Comments: Patient is a 44 year old female with a past medical history of hypertension who presents with SOB and congestion that started 3 days ago. Symptoms started gradually and progressively worsened since the onset. The congestion is nasal. She reports productive cough with green phlegm. She reports associated sore throat, fatigue, and decreased appetite. Patient was seen yesterday and diagnosed with CAP. Patient started on azithromycin. She did not get her Rx filled due to transportation. She is concerned that she is still feeling bad but has not taken her medication. She wants to know what she should do. No aggravating/alleviating factors. No other associated symptoms.    Past Medical History  Diagnosis Date  . Asthma   . Hypertension   . Fibromyalgia   . Neurogenic bladder   . Back pain, chronic   . Panic attacks   . Reflux   . Morbid obesity    Past Surgical History  Procedure Laterality Date  . Back surgery    . Carpal tunnel release    . Ureteral stent placement  2008  . Bladder stimulator     No family history on file. History  Substance Use Topics  . Smoking status: Former Research scientist (life sciences)  . Smokeless tobacco: Never Used     Comment: quit 2004  . Alcohol Use: No   OB History    Gravida Para Term Preterm AB TAB SAB Ectopic Multiple Living   6 4 4  0 2 0 2 0 0 4     Review of Systems  Constitutional: Negative for fever, chills and fatigue.  HENT: Negative for trouble swallowing.   Eyes: Negative for visual disturbance.  Respiratory: Positive for cough and shortness of breath.   Cardiovascular: Positive for chest pain. Negative for palpitations.  Gastrointestinal: Negative  for nausea, vomiting, abdominal pain and diarrhea.  Genitourinary: Negative for dysuria and difficulty urinating.  Musculoskeletal: Negative for arthralgias and neck pain.  Skin: Negative for color change.  Neurological: Negative for dizziness and weakness.  Psychiatric/Behavioral: Negative for dysphoric mood.      Allergies  Clindamycin/lincomycin; Ibuprofen; and Tomato  Home Medications   Prior to Admission medications   Medication Sig Start Date End Date Taking? Authorizing Provider  albuterol (PROVENTIL HFA;VENTOLIN HFA) 108 (90 BASE) MCG/ACT inhaler Inhale 2 puffs into the lungs every 6 (six) hours as needed. Takes for shortness of breath    Historical Provider, MD  albuterol (PROVENTIL HFA;VENTOLIN HFA) 108 (90 BASE) MCG/ACT inhaler Inhale 2 puffs into the lungs every 4 (four) hours as needed for wheezing or shortness of breath. 07/24/13   Kristen N Ward, DO  ALPRAZolam (XANAX) 0.5 MG tablet Take 0.5 mg by mouth at bedtime as needed. Takes for depression    Historical Provider, MD  azithromycin (ZITHROMAX) 250 MG tablet Take 1 tablet (250 mg total) by mouth daily. Take first 2 tablets together, then 1 every day until finished. 05/05/14   Mariea Clonts, MD  benzonatate (TESSALON) 100 MG capsule Take 1 capsule (100 mg total) by mouth every 8 (eight) hours. 04/27/14   Verda Cumins Dansie, PA-C  budesonide-formoterol (SYMBICORT) 160-4.5 MCG/ACT inhaler Inhale 2 puffs into the lungs 2 (two) times daily.    Historical Provider,  MD  cyclobenzaprine (FLEXERIL) 10 MG tablet Take 10 mg by mouth 3 (three) times daily as needed. Takes for muscle spasms    Historical Provider, MD  dextromethorphan (DELSYM) 30 MG/5ML liquid Take 10 mLs (60 mg total) by mouth 2 (two) times daily. 03/04/13   Billy Fischer, MD  diclofenac sodium (VOLTAREN) 1 % GEL Apply 2 g topically 4 (four) times daily.    Historical Provider, MD  diphenhydrAMINE (BENADRYL) 25 MG tablet Take 1 tablet (25 mg total) by mouth every 6  (six) hours as needed for itching or allergies. 04/27/14   Verda Cumins Dansie, PA-C  furosemide (LASIX) 40 MG tablet Take 1 tablet (40 mg total) by mouth daily. 05/09/12   Verlee Monte, MD  guaiFENesin (MUCINEX) 600 MG 12 hr tablet Take 2 tablets (1,200 mg total) by mouth 2 (two) times daily. 05/09/12   Verlee Monte, MD  ipratropium (ATROVENT) 0.06 % nasal spray Place 2 sprays into the nose 4 (four) times daily. 03/04/13   Billy Fischer, MD  losartan (COZAAR) 100 MG tablet Take 100 mg by mouth daily.    Historical Provider, MD  losartan (COZAAR) 25 MG tablet Take 25 mg by mouth daily.    Historical Provider, MD  montelukast (SINGULAIR) 10 MG tablet Take 10 mg by mouth at bedtime.    Historical Provider, MD  OMEPRAZOLE PO Take 1 tablet by mouth daily.    Historical Provider, MD  oxyCODONE-acetaminophen (PERCOCET) 10-325 MG per tablet Take 1 tablet by mouth every 4 (four) hours as needed. Takes for pain    Historical Provider, MD  oxymorphone (OPANA ER) 20 MG 12 hr tablet Take 20 mg by mouth every 12 (twelve) hours.    Historical Provider, MD  Phenyleph-Promethazine-Cod 5-6.25-10 MG/5ML SYRP Take 5 mLs by mouth every 6 (six) hours as needed. 05/19/13   Hope Bunnie Pion, NP  predniSONE (DELTASONE) 20 MG tablet Take 3 tablets (60 mg total) by mouth daily. 07/24/13   Kristen N Ward, DO  predniSONE (STERAPRED UNI-PAK) 10 MG tablet Take 6-5-4-3-2-1 tabs PO daily till gone 05/09/12   Verlee Monte, MD  pregabalin (LYRICA) 75 MG capsule Take 75 mg by mouth 3 (three) times daily.    Historical Provider, MD   BP 160/118 mmHg  Pulse 112  Temp(Src) 98.8 F (37.1 C) (Oral)  Resp 20  Ht 5' (1.524 m)  Wt 305 lb (138.347 kg)  BMI 59.57 kg/m2  SpO2 99%  LMP 04/15/2014 (Approximate) Physical Exam  Constitutional: She is oriented to person, place, and time. She appears well-developed and well-nourished. No distress.  HENT:  Head: Normocephalic and atraumatic.  Nose: Nose normal.  Mouth/Throat: Oropharynx is clear  and moist. No oropharyngeal exudate.  Eyes: Conjunctivae and EOM are normal.  Neck: Normal range of motion.  Cardiovascular: Regular rhythm.  Exam reveals no gallop and no friction rub.   No murmur heard. Mildly tachycardic  Pulmonary/Chest: Effort normal and breath sounds normal. She has no wheezes. She has no rales. She exhibits no tenderness.  Abdominal: Soft. She exhibits no distension. There is no tenderness. There is no rebound.  Musculoskeletal: Normal range of motion.  Neurological: She is alert and oriented to person, place, and time. Coordination normal.  Speech is goal-oriented. Moves limbs without ataxia.   Skin: Skin is warm and dry.  Psychiatric: She has a normal mood and affect. Her behavior is normal.  Nursing note and vitals reviewed.   ED Course  Procedures (including critical care time) Labs Review  Labs Reviewed  CBC - Abnormal; Notable for the following:    WBC 11.3 (*)    All other components within normal limits  BASIC METABOLIC PANEL - Abnormal; Notable for the following:    Glucose, Bld 112 (*)    GFR calc non Af Amer 72 (*)    GFR calc Af Amer 84 (*)    All other components within normal limits  Randolm Idol, ED    Imaging Review Dg Chest 2 View  05/04/2014   CLINICAL DATA:  Shortness of breath for 1 week.  EXAM: CHEST  2 VIEW  COMPARISON:  Chest radiograph 04/27/2014  FINDINGS: Stable cardiac and mediastinal contours. No consolidative pulmonary opacities. No pleural effusion or pneumothorax. Regional skeleton is unremarkable.  IMPRESSION: No acute cardiopulmonary process.   Electronically Signed   By: Lovey Newcomer M.D.   On: 05/04/2014 22:42     EKG Interpretation None      MDM   Final diagnoses:  CAP (community acquired pneumonia)    2:31 AM Labs show mildly elevated WBC at 11.3. Patient diagnosed with CAP yesterday and given azithromycin. Patient was unable to get the Rx today due to transportation. Patient will have a dose here and IV  fluids. Patient advised to get Rx filled. Patient has nasal congestion, cough, and sore throat. Patient's symptoms likely due to infectious etiology. I doubt PE at this time. Patient instructed to return with worsening or concerning symptoms.     Alvina Chou, PA-C 05/06/14 [place], MD 05/06/14 [phone]

## 2014-05-06 NOTE — ED Notes (Signed)
PT placed in gown and in bed. Pt monitored by pulse ox, bp cuff, and 5-lead.

## 2014-05-25 ENCOUNTER — Inpatient Hospital Stay (HOSPITAL_COMMUNITY): Payer: Medicaid Other

## 2014-05-25 ENCOUNTER — Inpatient Hospital Stay (HOSPITAL_COMMUNITY)
Admission: AD | Admit: 2014-05-25 | Discharge: 2014-05-25 | Disposition: A | Discharge status: Home / Self Care | Payer: Medicaid Other | Source: Ambulatory Visit | Attending: Emergency Medicine | Admitting: Emergency Medicine

## 2014-05-25 ENCOUNTER — Encounter (HOSPITAL_COMMUNITY): Payer: Self-pay | Admitting: Emergency Medicine

## 2014-05-25 DIAGNOSIS — K219 Gastro-esophageal reflux disease without esophagitis: Secondary | ICD-10-CM | POA: Diagnosis not present

## 2014-05-25 DIAGNOSIS — R0789 Other chest pain: Secondary | ICD-10-CM

## 2014-05-25 DIAGNOSIS — M797 Fibromyalgia: Secondary | ICD-10-CM | POA: Diagnosis not present

## 2014-05-25 DIAGNOSIS — I10 Essential (primary) hypertension: Secondary | ICD-10-CM | POA: Diagnosis not present

## 2014-05-25 DIAGNOSIS — R079 Chest pain, unspecified: Secondary | ICD-10-CM | POA: Insufficient documentation

## 2014-05-25 DIAGNOSIS — R0602 Shortness of breath: Secondary | ICD-10-CM | POA: Insufficient documentation

## 2014-05-25 DIAGNOSIS — Z87891 Personal history of nicotine dependence: Secondary | ICD-10-CM | POA: Diagnosis not present

## 2014-05-25 LAB — BASIC METABOLIC PANEL
Anion gap: 4 — ABNORMAL LOW (ref 5–15)
BUN: 10 mg/dL (ref 6–23)
CO2: 30 mmol/L (ref 19–32)
Calcium: 8.7 mg/dL (ref 8.4–10.5)
Chloride: 108 mmol/L (ref 96–112)
Creatinine, Ser: 0.97 mg/dL (ref 0.50–1.10)
GFR calc Af Amer: 81 mL/min — ABNORMAL LOW (ref >90)
GFR calc non Af Amer: 70 mL/min — ABNORMAL LOW (ref >90)
Glucose, Bld: 103 mg/dL — ABNORMAL HIGH (ref 70–99)
Potassium: 3.4 mmol/L — ABNORMAL LOW (ref 3.5–5.1)
Sodium: 142 mmol/L (ref 135–145)

## 2014-05-25 LAB — CBC
HCT: 34.5 % — ABNORMAL LOW (ref 36.0–46.0)
Hemoglobin: 11.2 g/dL — ABNORMAL LOW (ref 12.0–15.0)
MCH: 28.2 pg (ref 26.0–34.0)
MCHC: 32.5 g/dL (ref 30.0–36.0)
MCV: 86.9 fL (ref 78.0–100.0)
Platelets: 390 10*3/uL (ref 150–400)
RBC: 3.97 MIL/uL (ref 3.87–5.11)
RDW: 13.8 % (ref 11.5–15.5)
WBC: 11.3 10*3/uL — ABNORMAL HIGH (ref 4.0–10.5)

## 2014-05-25 LAB — D-DIMER, QUANTITATIVE (NOT AT ARMC): D DIMER QUANT: 0.55 ug{FEU}/mL — AB (ref 0.00–0.48)

## 2014-05-25 LAB — I-STAT TROPONIN, ED: Troponin i, poc: 0 ng/mL (ref 0.00–0.08)

## 2014-05-25 LAB — BRAIN NATRIURETIC PEPTIDE: B NATRIURETIC PEPTIDE 5: 14 pg/mL (ref 0.0–100.0)

## 2014-05-25 MED ORDER — SODIUM CHLORIDE 0.9 % IV SOLN
999.0000 mL | INTRAVENOUS | Status: DC
  Administered 2014-05-25: 999 mL via INTRAVENOUS

## 2014-05-25 MED ORDER — OXYCODONE-ACETAMINOPHEN 5-325 MG PO TABS: 1.0000 | ORAL_TABLET | ORAL | Status: DC | PRN

## 2014-05-25 MED ORDER — NITROGLYCERIN 0.4 MG SL SUBL
0.4000 mg | SUBLINGUAL_TABLET | SUBLINGUAL | Status: DC | PRN
  Administered 2014-05-25 (×2): 0.4 mg via SUBLINGUAL
  Filled 2014-05-25 (×2): qty 1

## 2014-05-25 MED ORDER — MORPHINE SULFATE 4 MG/ML IJ SOLN
4.0000 mg | Freq: Once | INTRAMUSCULAR | Status: AC
  Administered 2014-05-25: 4 mg via INTRAVENOUS
  Filled 2014-05-25: qty 1

## 2014-05-25 MED ORDER — OXYCODONE-ACETAMINOPHEN 5-325 MG PO TABS
2.0000 | ORAL_TABLET | Freq: Once | ORAL | Status: AC
  Administered 2014-05-25: 2 via ORAL
  Filled 2014-05-25: qty 2

## 2014-05-25 MED ORDER — IOHEXOL 350 MG/ML SOLN
100.0000 mL | Freq: Once | INTRAVENOUS | Status: AC | PRN
  Administered 2014-05-25: 100 mL via INTRAVENOUS

## 2014-05-25 NOTE — ED Notes (Signed)
Pt arrives via EMS, drove self to [facility] who transferred here. L sided chest pain started about an hour ago, worse with movement and deep breaths. Feels like a rock to chest. Recent dx pneumonia, didn't complete antibiotics. NSR. 2 nitro p/t arrival, no aspirin d/t allergy. Nitro not effective.

## 2014-05-25 NOTE — MAU Note (Signed)
Pt stated she was at home and started having sharp chest pain and pressure on her left side and running down her arm. She is alos c/o SOB.

## 2014-05-25 NOTE — ED Provider Notes (Signed)
CSN: [id number]     Arrival date & time 05/25/14  0050 History  This chart was scribed for Felicia Fuel, MD by Delphia Grates, ED Scribe. This patient was seen in room A03C/A03C and the patient's care was started at 2:45 AM.   Chief Complaint  Patient presents with  . Chest Pain    The history is provided by the patient. No language interpreter was used.    HPI Comments: Felicia Molina is a 44 y.o. female, with history of HTN, brought in by ambulance, who presents to the Emergency Department complaining of 9/10, left sided chest pain that radiates to the left arm with onset approximately 2 hours ago. There is associated "hard" cough, but states this does not feel similar to pneumonia. Patient states the pain is worse with deep inhalation. Patient states she was recently diagnosed with pneumonia 2-3 weeks ago and was placed on ABX and cough syrup. However, she reports the ABX was causing her to vomit and was taken off the medication by her PCP. She denies fever, chills, diaphoresis, nausea. Patient takes oxymorphone daily, but states this was ineffective. She is currently on medication for HTN and asthma, and also takes Flexeril for muscle spasms. Patient is a nonsmoker and denies history of EtOH consumption or illicit drug use.    Past Medical History  Diagnosis Date  . Asthma   . Hypertension   . Fibromyalgia   . Neurogenic bladder   . Back pain, chronic   . Panic attacks   . Reflux   . Morbid obesity    Past Surgical History  Procedure Laterality Date  . Back surgery    . Carpal tunnel release    . Ureteral stent placement  2008  . Bladder stimulator     No family history on file. History  Substance Use Topics  . Smoking status: Former Research scientist (life sciences)  . Smokeless tobacco: Never Used     Comment: quit 2004  . Alcohol Use: No   OB History    Gravida Para Term Preterm AB TAB SAB Ectopic Multiple Living   6 4 4  0 2 0 2 0 0 4     Review of Systems  Constitutional: Negative for  fever, chills and diaphoresis.  Respiratory: Positive for cough.   Cardiovascular: Positive for chest pain.  Gastrointestinal: Negative for nausea and vomiting.  Musculoskeletal: Positive for myalgias.  All other systems reviewed and are negative.     Allergies  Clindamycin/lincomycin; Ibuprofen; and Tomato  Home Medications   Prior to Admission medications   Medication Sig Start Date End Date Taking? Authorizing Provider  albuterol (PROVENTIL HFA;VENTOLIN HFA) 108 (90 BASE) MCG/ACT inhaler Inhale 2 puffs into the lungs every 6 (six) hours as needed. Takes for shortness of breath    Historical Provider, MD  albuterol (PROVENTIL HFA;VENTOLIN HFA) 108 (90 BASE) MCG/ACT inhaler Inhale 2 puffs into the lungs every 4 (four) hours as needed for wheezing or shortness of breath. 07/24/13   Kristen N Ward, DO  ALPRAZolam (XANAX) 0.5 MG tablet Take 0.5 mg by mouth at bedtime as needed. Takes for depression    Historical Provider, MD  azithromycin (ZITHROMAX) 250 MG tablet Take 1 tablet (250 mg total) by mouth daily. Take first 2 tablets together, then 1 every day until finished. 05/05/14   Mariea Clonts, MD  benzonatate (TESSALON) 100 MG capsule Take 1 capsule (100 mg total) by mouth every 8 (eight) hours. 04/27/14   Verda Cumins Dansie, PA-C  budesonide-formoterol [facility]) 160-4.5  MCG/ACT inhaler Inhale 2 puffs into the lungs 2 (two) times daily.    Historical Provider, MD  cyclobenzaprine (FLEXERIL) 10 MG tablet Take 10 mg by mouth 3 (three) times daily as needed. Takes for muscle spasms    Historical Provider, MD  dextromethorphan (DELSYM) 30 MG/5ML liquid Take 10 mLs (60 mg total) by mouth 2 (two) times daily. 03/04/13   Billy Fischer, MD  diclofenac sodium (VOLTAREN) 1 % GEL Apply 2 g topically 4 (four) times daily.    Historical Provider, MD  diphenhydrAMINE (BENADRYL) 25 MG tablet Take 1 tablet (25 mg total) by mouth every 6 (six) hours as needed for itching or allergies. 04/27/14   Verda Cumins Dansie, PA-C  furosemide (LASIX) 40 MG tablet Take 1 tablet (40 mg total) by mouth daily. 05/09/12   Verlee Monte, MD  guaiFENesin (MUCINEX) 600 MG 12 hr tablet Take 2 tablets (1,200 mg total) by mouth 2 (two) times daily. 05/09/12   Verlee Monte, MD  ipratropium (ATROVENT) 0.06 % nasal spray Place 2 sprays into the nose 4 (four) times daily. 03/04/13   Billy Fischer, MD  losartan (COZAAR) 100 MG tablet Take 100 mg by mouth daily.    Historical Provider, MD  losartan (COZAAR) 25 MG tablet Take 25 mg by mouth daily.    Historical Provider, MD  montelukast (SINGULAIR) 10 MG tablet Take 10 mg by mouth at bedtime.    Historical Provider, MD  OMEPRAZOLE PO Take 1 tablet by mouth daily.    Historical Provider, MD  oxyCODONE-acetaminophen (PERCOCET) 10-325 MG per tablet Take 1 tablet by mouth every 4 (four) hours as needed. Takes for pain    Historical Provider, MD  oxymorphone (OPANA ER) 20 MG 12 hr tablet Take 20 mg by mouth every 12 (twelve) hours.    Historical Provider, MD  Phenyleph-Promethazine-Cod 5-6.25-10 MG/5ML SYRP Take 5 mLs by mouth every 6 (six) hours as needed. 05/19/13   Hope Bunnie Pion, NP  predniSONE (DELTASONE) 20 MG tablet Take 3 tablets (60 mg total) by mouth daily. 07/24/13   Kristen N Ward, DO  predniSONE (STERAPRED UNI-PAK) 10 MG tablet Take 6-5-4-3-2-1 tabs PO daily till gone 05/09/12   Verlee Monte, MD  pregabalin (LYRICA) 75 MG capsule Take 75 mg by mouth 3 (three) times daily.    Historical Provider, MD   Triage Vitals: BP 109/53 mmHg  Pulse 88  Temp(Src) 97.9 F (36.6 C) (Oral)  Resp 20  Ht 5' (1.524 m)  Wt 310 lb (140.615 kg)  BMI 60.54 kg/m2  SpO2 100%  LMP 04/15/2014 (Approximate)  Physical Exam  Constitutional: She is oriented to person, place, and time. She appears well-developed and well-nourished. No distress.  Morbidly obese.  HENT:  Head: Normocephalic and atraumatic.  Eyes: Conjunctivae and EOM are normal. Pupils are equal, round, and reactive to light.   Neck: Normal range of motion. Neck supple. No JVD present.  Cardiovascular: Normal rate, regular rhythm and normal heart sounds.   No murmur heard. Pulmonary/Chest: Breath sounds normal. She has no wheezes. She has no rales. She exhibits tenderness.  Moderate tenderness to left anterior chest wall.  Abdominal: Soft. Bowel sounds are normal. She exhibits no distension and no mass. There is no tenderness.  Musculoskeletal: Normal range of motion. She exhibits edema.  Trace edema.  Lymphadenopathy:    She has no cervical adenopathy.  Neurological: She is alert and oriented to person, place, and time. No cranial nerve deficit. Coordination normal.  Skin: Skin is  warm and dry. No rash noted.  Psychiatric: She has a normal mood and affect. Her behavior is normal. Thought content normal.  Nursing note and vitals reviewed.   ED Course  Procedures (including critical care time)  DIAGNOSTIC STUDIES: Oxygen Saturation is 100% on room air, normal by my interpretation.    COORDINATION OF CARE: At 63 Discussed treatment plan with patient which includes pain medication and labs to rule out possible PE/DVT. Patient agrees.   Labs Review Results for orders placed or performed during the hospital encounter of 05/25/14  CBC  Result Value Ref Range   WBC 11.3 (H) 4.0 - 10.5 K/uL   RBC 3.97 3.87 - 5.11 MIL/uL   Hemoglobin 11.2 (L) 12.0 - 15.0 g/dL   HCT 34.5 (L) 36.0 - 46.0 %   MCV 86.9 78.0 - 100.0 fL   MCH 28.2 26.0 - 34.0 pg   MCHC 32.5 30.0 - 36.0 g/dL   RDW 13.8 11.5 - 15.5 %   Platelets 390 150 - 400 K/uL  Basic metabolic panel  Result Value Ref Range   Sodium 142 135 - 145 mmol/L   Potassium 3.4 (L) 3.5 - 5.1 mmol/L   Chloride 108 96 - 112 mmol/L   CO2 30 19 - 32 mmol/L   Glucose, Bld 103 (H) 70 - 99 mg/dL   BUN 10 6 - 23 mg/dL   Creatinine, Ser 0.97 0.50 - 1.10 mg/dL   Calcium 8.7 8.4 - 10.5 mg/dL   GFR calc non Af Amer 70 (L) >90 mL/min   GFR calc Af Amer 81 (L) >90 mL/min    Anion gap 4 (L) 5 - 15  BNP (order ONLY if patient complains of dyspnea/SOB AND you have documented it for THIS visit)  Result Value Ref Range   B Natriuretic Peptide 14.0 0.0 - 100.0 pg/mL  D-dimer, quantitative  Result Value Ref Range   D-Dimer, Quant 0.55 (H) 0.00 - 0.48 ug/mL-FEU  I-stat troponin, ED (not at [facility])  Result Value Ref Range   Troponin i, poc 0.00 0.00 - 0.08 ng/mL   Comment 3           Imaging Review Dg Chest 2 View  05/25/2014   CLINICAL DATA:  Upper left-sided chest pain radiating down the left arm. Shortness of breath.  EXAM: CHEST  2 VIEW  COMPARISON:  05/04/2014  FINDINGS: The heart size and mediastinal contours are within normal limits. Both lungs are clear. The visualized skeletal structures are unremarkable.  IMPRESSION: No active cardiopulmonary disease.   Electronically Signed   By: Lucienne Capers M.D.   On: 05/25/2014 02:40     EKG Interpretation   Date/Time:  Saturday May 25 2014 01:59:08 EST Ventricular Rate:  90 PR Interval:  144 QRS Duration: 78 QT Interval:  387 QTC Calculation: 473 R Axis:   33 Text Interpretation:  Sinus rhythm Low voltage, precordial leads When  compared with ECG of 05/05/2014, No significant change was found Confirmed  by [facility]  MD, Audryna Wendt ([id number]) on 05/25/2014 7:22:24 AM      MDM   Final diagnoses:  Chest pain of unknown etiology  Chest wall pain    Chest pain most consistent with costochondritis. Workup is initiated to look for more serious causes. ECG is unchanged from baseline. Troponin is normal. D-dimer has come back mildly elevated and she is sent for CT angiogram which is negative for pulmonary embolism. She has allergies to NSAIDs cannot be treated with them. She is discharged with  prescription for oxycodone have acetaminophen for pain and is to follow-up with her PCP.  I personally performed the services described in this documentation, which was scribed in my presence. The recorded information has been  reviewed and is accurate.     Felicia Fuel, MD 27/03/50 0938

## 2014-05-25 NOTE — MAU Provider Note (Signed)
History     CSN: [id number]  Arrival date and time: 05/25/14 0050   None     Chief Complaint  Patient presents with  . Chest Pain   HPI    Ms. Felicia Molina is a 44 y.o. female [phone] who presents with acute onset of chest pain that radiates to her left shoulder; symptoms started 10 mins to her arrival here. Patient is experiencing SOB and pressure in her chest. It feels like something hit her in her chest; and the pain is radiating to her left arm. She rates her chest pain 10/10 currently.  The patient denies a history of cardiac problems other than hypertension, in which she is being treated for.   The patient states she has a history of reflux disease however this pain is much worse.  Patient states she cannot take ASA; she is allergic. It causes her throat to close   OB History    Gravida Para Term Preterm AB TAB SAB Ectopic Multiple Living   6 4 4  0 2 0 2 0 0 4       Past Medical History  Diagnosis Date  . Asthma   . Hypertension   . Fibromyalgia   . Neurogenic bladder   . Back pain, chronic   . Panic attacks   . Reflux   . Morbid obesity     Past Surgical History  Procedure Laterality Date  . Back surgery    . Carpal tunnel release    . Ureteral stent placement  2008  . Bladder stimulator      No family history on file.  History  Substance Use Topics  . Smoking status: Former Research scientist (life sciences)  . Smokeless tobacco: Never Used     Comment: quit 2004  . Alcohol Use: No    Allergies:  Allergies  Allergen Reactions  . Clindamycin/Lincomycin Hives and Swelling  . Ibuprofen Hives    Hives across the face  . Tomato Hives    Prescriptions prior to admission  Medication Sig Dispense Refill Last Dose  . albuterol (PROVENTIL HFA;VENTOLIN HFA) 108 (90 BASE) MCG/ACT inhaler Inhale 2 puffs into the lungs every 6 (six) hours as needed. Takes for shortness of breath   Taking  . albuterol (PROVENTIL HFA;VENTOLIN HFA) 108 (90 BASE) MCG/ACT inhaler Inhale 2 puffs into  the lungs every 4 (four) hours as needed for wheezing or shortness of breath. 1 Inhaler 0   . ALPRAZolam (XANAX) 0.5 MG tablet Take 0.5 mg by mouth at bedtime as needed. Takes for depression   Taking  . azithromycin (ZITHROMAX) 250 MG tablet Take 1 tablet (250 mg total) by mouth daily. Take first 2 tablets together, then 1 every day until finished. 4 tablet 0   . benzonatate (TESSALON) 100 MG capsule Take 1 capsule (100 mg total) by mouth every 8 (eight) hours. 21 capsule 0   . budesonide-formoterol (SYMBICORT) 160-4.5 MCG/ACT inhaler Inhale 2 puffs into the lungs 2 (two) times daily.   Taking  . cyclobenzaprine (FLEXERIL) 10 MG tablet Take 10 mg by mouth 3 (three) times daily as needed. Takes for muscle spasms   Taking  . dextromethorphan (DELSYM) 30 MG/5ML liquid Take 10 mLs (60 mg total) by mouth 2 (two) times daily. 89 mL 0   . diclofenac sodium (VOLTAREN) 1 % GEL Apply 2 g topically 4 (four) times daily.   Taking  . diphenhydrAMINE (BENADRYL) 25 MG tablet Take 1 tablet (25 mg total) by mouth every 6 (six) hours as needed  for itching or allergies. 20 tablet 0   . furosemide (LASIX) 40 MG tablet Take 1 tablet (40 mg total) by mouth daily. 30 tablet 0 Not Taking  . guaiFENesin (MUCINEX) 600 MG 12 hr tablet Take 2 tablets (1,200 mg total) by mouth 2 (two) times daily. 28 tablet 0 Taking  . ipratropium (ATROVENT) 0.06 % nasal spray Place 2 sprays into the nose 4 (four) times daily. 15 mL 1   . losartan (COZAAR) 100 MG tablet Take 100 mg by mouth daily.   Taking  . losartan (COZAAR) 25 MG tablet Take 25 mg by mouth daily.   Not Taking  . montelukast (SINGULAIR) 10 MG tablet Take 10 mg by mouth at bedtime.   Taking  . OMEPRAZOLE PO Take 1 tablet by mouth daily.   Taking  . oxyCODONE-acetaminophen (PERCOCET) 10-325 MG per tablet Take 1 tablet by mouth every 4 (four) hours as needed. Takes for pain   Taking  . oxymorphone (OPANA ER) 20 MG 12 hr tablet Take 20 mg by mouth every 12 (twelve) hours.    Taking  . Phenyleph-Promethazine-Cod 5-6.25-10 MG/5ML SYRP Take 5 mLs by mouth every 6 (six) hours as needed. 120 mL 0   . predniSONE (DELTASONE) 20 MG tablet Take 3 tablets (60 mg total) by mouth daily. 12 tablet 0   . predniSONE (STERAPRED UNI-PAK) 10 MG tablet Take 6-5-4-3-2-1 tabs PO daily till gone 21 tablet 0 Not Taking  . pregabalin (LYRICA) 75 MG capsule Take 75 mg by mouth 3 (three) times daily.   Taking   No results found for this or any previous visit (from the past 48 hour(s)).  Review of Systems  Respiratory: Positive for shortness of breath.   Cardiovascular: Positive for chest pain.  Gastrointestinal: Negative for heartburn.   Physical Exam   Blood pressure 143/60, pulse 101, temperature 98.6 F (37 C), temperature source Oral, last menstrual period 04/15/2014, SpO2 100 %.  Physical Exam  Constitutional: She is oriented to person, place, and time. She appears well-developed.  Obese   HENT:  Head: Normocephalic.  Cardiovascular: Normal rate.   Respiratory: Breath sounds normal. Accessory muscle usage present. No respiratory distress.  Neurological: She is alert and oriented to person, place, and time.  Skin: Skin is warm. She is diaphoretic.  Psychiatric: Her behavior is normal.    MAU Course  Procedures  None  MDM  NS IV established  EKG 0110: [facility] called; Dr. Claudine Mouton to accept care of the patient upon transport  0115: EMS called for transport  0125: EMS arrival  Nitroglycerin  X 2 doses    Assessment and Plan   A: Chest pain in female   P: Transfer to [facility] hospital ED for further treatment/ evaluation  Darrelyn Hillock Priest Lockridge, NP 05/25/2014 1:39 AM

## 2014-05-25 NOTE — Discharge Instructions (Signed)
Your evaluation did not show any serious problems causing your chest pain. It is probably related to inflammation from your recent infection. Take the painkiller as needed and follow-up with your primary care provider.  Chest Wall Pain Chest wall pain is pain in or around the bones and muscles of your chest. It may take up to 6 weeks to get better. It may take longer if you must stay physically active in your work and activities.  CAUSES  Chest wall pain may happen on its own. However, it may be caused by:  A viral illness like the flu.  Injury.  Coughing.  Exercise.  Arthritis.  Fibromyalgia.  Shingles. HOME CARE INSTRUCTIONS   Avoid overtiring physical activity. Try not to strain or perform activities that cause pain. This includes any activities using your chest or your abdominal and side muscles, especially if heavy weights are used.  Put ice on the sore area.  Put ice in a plastic bag.  Place a towel between your skin and the bag.  Leave the ice on for 15-20 minutes per hour while awake for the first 2 days.  Only take over-the-counter or prescription medicines for pain, discomfort, or fever as directed by your caregiver. SEEK IMMEDIATE MEDICAL CARE IF:   Your pain increases, or you are very uncomfortable.  You have a fever.  Your chest pain becomes worse.  You have new, unexplained symptoms.  You have nausea or vomiting.  You feel sweaty or lightheaded.  You have a cough with phlegm (sputum), or you cough up blood. MAKE SURE YOU:   Understand these instructions.  Will watch your condition.  Will get help right away if you are not doing well or get worse. Document Released: 04/12/2005 Document Revised: 07/05/2011 Document Reviewed: 12/07/2010 [facility] Patient Information 2015 [place], [place]. This information is not intended to replace advice given to you by your health care provider. Make sure you discuss any questions you have with your health care  provider.  Acetaminophen; Oxycodone tablets What is this medicine? ACETAMINOPHEN; OXYCODONE (a set a MEE noe fen; ox i KOE done) is a pain reliever. It is used to treat mild to moderate pain. This medicine may be used for other purposes; ask your health care provider or pharmacist if you have questions. COMMON BRAND NAME(S): Endocet, Magnacet, Narvox, Percocet, Perloxx, Primalev, Primlev, Roxicet, Xolox What should I tell my health care provider before I take this medicine? They need to know if you have any of these conditions: -brain tumor -Crohn's disease, inflammatory bowel disease, or ulcerative colitis -drug abuse or addiction -head injury -heart or circulation problems -if you often drink alcohol -kidney disease or problems going to the bathroom -liver disease -lung disease, asthma, or breathing problems -an unusual or allergic reaction to acetaminophen, oxycodone, other opioid analgesics, other medicines, foods, dyes, or preservatives -pregnant or trying to get pregnant -breast-feeding How should I use this medicine? Take this medicine by mouth with a full glass of water. Follow the directions on the prescription label. Take your medicine at regular intervals. Do not take your medicine more often than directed. Talk to your pediatrician regarding the use of this medicine in children. Special care may be needed. Patients over 78 years old may have a stronger reaction and need a smaller dose. Overdosage: If you think you have taken too much of this medicine contact a poison control center or emergency room at once. NOTE: This medicine is only for you. Do not share this medicine with others.  What if I miss a dose? If you miss a dose, take it as soon as you can. If it is almost time for your next dose, take only that dose. Do not take double or extra doses. What may interact with this medicine? -alcohol -antihistamines -barbiturates like amobarbital, butalbital, butabarbital,  methohexital, pentobarbital, phenobarbital, thiopental, and secobarbital -benztropine -drugs for bladder problems like solifenacin, trospium, oxybutynin, tolterodine, hyoscyamine, and methscopolamine -drugs for breathing problems like ipratropium and tiotropium -drugs for certain stomach or intestine problems like propantheline, homatropine methylbromide, glycopyrrolate, atropine, belladonna, and dicyclomine -general anesthetics like etomidate, ketamine, nitrous oxide, propofol, desflurane, enflurane, halothane, isoflurane, and sevoflurane -medicines for depression, anxiety, or psychotic disturbances -medicines for sleep -muscle relaxants -naltrexone -narcotic medicines (opiates) for pain -phenothiazines like perphenazine, thioridazine, chlorpromazine, mesoridazine, fluphenazine, prochlorperazine, promazine, and trifluoperazine -scopolamine -tramadol -trihexyphenidyl This list may not describe all possible interactions. Give your health care provider a list of all the medicines, herbs, non-prescription drugs, or dietary supplements you use. Also tell them if you smoke, drink alcohol, or use illegal drugs. Some items may interact with your medicine. What should I watch for while using this medicine? Tell your doctor or health care professional if your pain does not go away, if it gets worse, or if you have new or a different type of pain. You may develop tolerance to the medicine. Tolerance means that you will need a higher dose of the medication for pain relief. Tolerance is normal and is expected if you take this medicine for a long time. Do not suddenly stop taking your medicine because you may develop a severe reaction. Your body becomes used to the medicine. This does NOT mean you are addicted. Addiction is a behavior related to getting and using a drug for a non-medical reason. If you have pain, you have a medical reason to take pain medicine. Your doctor will tell you how much medicine to  take. If your doctor wants you to stop the medicine, the dose will be slowly lowered over time to avoid any side effects. You may get drowsy or dizzy. Do not drive, use machinery, or do anything that needs mental alertness until you know how this medicine affects you. Do not stand or sit up quickly, especially if you are an older patient. This reduces the risk of dizzy or fainting spells. Alcohol may interfere with the effect of this medicine. Avoid alcoholic drinks. There are different types of narcotic medicines (opiates) for pain. If you take more than one type at the same time, you may have more side effects. Give your health care provider a list of all medicines you use. Your doctor will tell you how much medicine to take. Do not take more medicine than directed. Call emergency for help if you have problems breathing. The medicine will cause constipation. Try to have a bowel movement at least every 2 to 3 days. If you do not have a bowel movement for 3 days, call your doctor or health care professional. Do not take Tylenol (acetaminophen) or medicines that have acetaminophen with this medicine. Too much acetaminophen can be very dangerous. Many nonprescription medicines contain acetaminophen. Always read the labels carefully to avoid taking more acetaminophen. What side effects may I notice from receiving this medicine? Side effects that you should report to your doctor or health care professional as soon as possible: -allergic reactions like skin rash, itching or hives, swelling of the face, lips, or tongue -breathing difficulties, wheezing -confusion -light headedness or fainting spells -severe  stomach pain -unusually weak or tired -yellowing of the skin or the whites of the eyes Side effects that usually do not require medical attention (report to your doctor or health care professional if they continue or are bothersome): -dizziness -drowsiness -nausea -vomiting This list may not describe  all possible side effects. Call your doctor for medical advice about side effects. You may report side effects to FDA at 1-800-FDA-1088. Where should I keep my medicine? Keep out of the reach of children. This medicine can be abused. Keep your medicine in a safe place to protect it from theft. Do not share this medicine with anyone. Selling or giving away this medicine is dangerous and against the law. Store at room temperature between 20 and 25 degrees C (68 and 77 degrees F). Keep container tightly closed. Protect from light. This medicine may cause accidental overdose and death if it is taken by other adults, children, or pets. Flush any unused medicine down the toilet to reduce the chance of harm. Do not use the medicine after the expiration date. NOTE: This sheet is a summary. It may not cover all possible information. If you have questions about this medicine, talk to your doctor, pharmacist, or health care provider.  2015, [employer]. (2012-12-04 13:17:35)

## 2014-06-04 ENCOUNTER — Encounter: Payer: Self-pay | Admitting: Internal Medicine

## 2014-06-04 ENCOUNTER — Ambulatory Visit (INDEPENDENT_AMBULATORY_CARE_PROVIDER_SITE_OTHER): Payer: Medicaid Other | Admitting: Internal Medicine

## 2014-06-04 VITALS — BP 120/86 | HR 96 | Ht 60.0 in | Wt 327.0 lb

## 2014-06-04 DIAGNOSIS — R0600 Dyspnea, unspecified: Secondary | ICD-10-CM | POA: Insufficient documentation

## 2014-06-04 NOTE — Assessment & Plan Note (Addendum)
When respiratory symptoms begin or become refractory well after a patient reports complete smoking cessation,  Especially when this wasn't the case while they were smoking, a red flag is raised based on the work of Dr Kris Mouton which states:  if you quit smoking when your best day FEV1 is still well preserved it is highly unlikely you will progress to severe disease.  That is to say, once the smoking stops,  the symptoms should not suddenly erupt or markedly worsen.  If so, the differential diagnosis should include  obesity/deconditioning,  LPR/Reflux/Aspiration syndromes,  occult CHF, or  especially side effect of medications commonly used in this population.   In this case  It is extremely unlikely Felicia Molina has copd and probably also doesn't have much if any asthma either (since Felicia Molina says her inhalers are helping but her hfa is vey poor and all her inhalers are on 0 and apparently have been for some time) but rather all the complications of wt gain that occurred p Felicia Molina quit smoking, both the direct (effect on diaphragm) and the indirect (effect on gastric pressure leading to LPR)  Strongly rec intense effort at wt loss and rx for gerd both acidic and non-acidic and continue symbicort for now but low threshold to try back off and just use saba prn  If any doubt about dx needs to return here s am meds for spirometry before and after while being maintained on adequate gerd rx

## 2014-06-04 NOTE — Progress Notes (Signed)
Subjective:     Patient ID: Felicia Molina, female   DOB: 1970-06-09, 44 y.o.   MRN: [id number]  HPI  68 yobf  Quit smoking around [place] @ wt 190 with some breathing problems then  that seemed worse with wt gain up to 327 at first pulmonary eval 06/04/2014 requested by Dr Emilee Hero bonus for cough and sob p dx of pna at er with neg CTa chest.    06/04/2014 1st [facility] office visit/ Cashis Rill   Chief Complaint  Patient presents with  . Advice Only    referred by Dr. Vista Lawman, pneumonia  reason went to ER = acute onset  bad cough and cp  L side and into shoulder on L and told had pna, better since rx for pna though CTa neg for pna  Has 2 symbicort 160 beyond red on empty, same for saba.  Says they both still help  No obvious day to day or daytime variabilty or assoc  chest tightness, subjective wheeze overt sinus or hb symptoms. No unusual exp hx or h/o childhood pna/ asthma or knowledge of premature birth.  Sleeping ok without nocturnal  or early am exacerbation  of respiratory  c/o's or need for noct saba. Also denies any obvious fluctuation of symptoms with weather or environmental changes or other aggravating or alleviating factors except as outlined above   Current Medications, Allergies, Complete Past Medical History, Past Surgical History, Family History, and Social History were reviewed in [employer] record.  ROS  The following are not active complaints unless bolded sore throat, dysphagia, dental problems, itching, sneezing,  nasal congestion or excess/ purulent secretions, ear ache,   fever, chills, sweats, unintended wt loss, pleuritic or exertional cp, hemoptysis,  orthopnea pnd or leg swelling, presyncope, palpitations, heartburn, abdominal pain, anorexia, nausea, vomiting, diarrhea  or change in bowel or urinary habits, change in stools or urine, dysuria,hematuria,  rash, arthralgias, visual complaints, headache, numbness weakness or ataxia or problems with  walking or coordination,  change in mood/affect or memory.           Review of Systems     Objective:   Physical Exam  amb obese bf nad  Wt Readings from Last 3 Encounters:  06/04/14 327 lb (148.326 kg)  05/25/14 310 lb (140.615 kg)  05/05/14 305 lb (138.347 kg)    Vital signs reviewed   HEENT: nl dentition, turbinates, and orophanx. Nl external ear canals without cough reflex   NECK :  without JVD/Nodes/TM/ nl carotid upstrokes bilaterally   LUNGS: no acc muscle use, clear to A and P bilaterally without cough on insp or exp maneuvers   CV:  RRR  no s3 or murmur or increase in P2, no edema   ABD:  Massively obese but soft and nontender with poor excursion in the supine position. No bruits or organomegaly, bowel sounds nl  MS:  warm without deformities, calf tenderness, cyanosis or clubbing  SKIN: warm and dry without lesions    NEURO:  alert, approp, no deficits     Images reviewed  CTa 05/25/14 No evidence of significant pulmonary embolus. Hazy perihilar infiltration may indicate edema although evaluation of the lungs is limited due to respiratory motion artifact.       Assessment:

## 2014-06-04 NOTE — Patient Instructions (Addendum)
Omeprazole Take 30- 60 min before your first and last meals of the day    GERD (REFLUX)  is an extremely common cause of respiratory symptoms just like yours , many times with no obvious heartburn at all.    It can be treated with medication, but also with lifestyle changes including avoidance of late meals, excessive alcohol, smoking cessation, and avoid fatty foods, chocolate, peppermint, colas, red wine, and acidic juices such as orange juice.  NO MINT OR MENTHOL PRODUCTS SO NO COUGH DROPS  USE SUGARLESS CANDY INSTEAD (Jolley ranchers or Stover's or Life Savers) or even ice chips will also do - the key is to swallow to prevent all throat clearing. NO OIL BASED VITAMINS - use powdered substitutes.  For cough >  Delsym 2 tsp every 12 hours as needed   Continue symbicort  Take 2 puffs first thing in am and then another 2 puffs about 12 hours later  Work on inhaler technique:  relax and gently blow all the way out then take a nice smooth deep breath back in, triggering the inhaler at same time you start breathing in.  Hold for up to 5 seconds if you can.  Rinse and gargle with water when done    Only use your albuterol (proair) as a rescue medication to be used if you can't catch your breath by resting or doing a relaxed purse lip breathing pattern.  - The less you use it, the better it will work when you need it. - Ok to use up to 2 puffs  every 4 hours if you must but call for immediate appointment if use goes up over your usual need - Don't leave home without it !!  (think of it like the spare tire for your car)   Pulmonary follow up is as needed - I will send my recommendations to Dr Vista Lawman

## 2014-08-04 ENCOUNTER — Encounter (HOSPITAL_BASED_OUTPATIENT_CLINIC_OR_DEPARTMENT_OTHER): Payer: Self-pay | Admitting: *Deleted

## 2014-08-04 ENCOUNTER — Emergency Department (HOSPITAL_BASED_OUTPATIENT_CLINIC_OR_DEPARTMENT_OTHER)
Admission: EM | Admit: 2014-08-04 | Discharge: 2014-08-04 | Disposition: A | Discharge status: Home / Self Care | Payer: Medicaid Other | Attending: Emergency Medicine | Admitting: Emergency Medicine

## 2014-08-04 DIAGNOSIS — M7989 Other specified soft tissue disorders: Secondary | ICD-10-CM | POA: Diagnosis present

## 2014-08-04 DIAGNOSIS — Z87891 Personal history of nicotine dependence: Secondary | ICD-10-CM | POA: Insufficient documentation

## 2014-08-04 DIAGNOSIS — M797 Fibromyalgia: Secondary | ICD-10-CM | POA: Diagnosis not present

## 2014-08-04 DIAGNOSIS — Z7951 Long term (current) use of inhaled steroids: Secondary | ICD-10-CM | POA: Diagnosis not present

## 2014-08-04 DIAGNOSIS — R600 Localized edema: Secondary | ICD-10-CM | POA: Diagnosis not present

## 2014-08-04 DIAGNOSIS — K219 Gastro-esophageal reflux disease without esophagitis: Secondary | ICD-10-CM | POA: Diagnosis not present

## 2014-08-04 DIAGNOSIS — Z79899 Other long term (current) drug therapy: Secondary | ICD-10-CM | POA: Diagnosis not present

## 2014-08-04 DIAGNOSIS — Z8742 Personal history of other diseases of the female genital tract: Secondary | ICD-10-CM | POA: Insufficient documentation

## 2014-08-04 DIAGNOSIS — J45909 Unspecified asthma, uncomplicated: Secondary | ICD-10-CM | POA: Insufficient documentation

## 2014-08-04 DIAGNOSIS — Z791 Long term (current) use of non-steroidal anti-inflammatories (NSAID): Secondary | ICD-10-CM | POA: Diagnosis not present

## 2014-08-04 DIAGNOSIS — F410 Panic disorder [episodic paroxysmal anxiety] without agoraphobia: Secondary | ICD-10-CM | POA: Diagnosis not present

## 2014-08-04 DIAGNOSIS — Z3202 Encounter for pregnancy test, result negative: Secondary | ICD-10-CM | POA: Diagnosis not present

## 2014-08-04 DIAGNOSIS — I10 Essential (primary) hypertension: Secondary | ICD-10-CM | POA: Insufficient documentation

## 2014-08-04 LAB — CBC WITH DIFFERENTIAL/PLATELET
BASOS PCT: 0 % (ref 0–1)
Basophils Absolute: 0 10*3/uL (ref 0.0–0.1)
Eosinophils Absolute: 0.2 10*3/uL (ref 0.0–0.7)
Eosinophils Relative: 2 % (ref 0–5)
HCT: 35.9 % — ABNORMAL LOW (ref 36.0–46.0)
Hemoglobin: 11.3 g/dL — ABNORMAL LOW (ref 12.0–15.0)
Lymphocytes Relative: 35 % (ref 12–46)
Lymphs Abs: 3.2 10*3/uL (ref 0.7–4.0)
MCH: 28.3 pg (ref 26.0–34.0)
MCHC: 31.5 g/dL (ref 30.0–36.0)
MCV: 90 fL (ref 78.0–100.0)
Monocytes Absolute: 0.6 10*3/uL (ref 0.1–1.0)
Monocytes Relative: 7 % (ref 3–12)
NEUTROS ABS: 5.1 10*3/uL (ref 1.7–7.7)
Neutrophils Relative %: 56 % (ref 43–77)
Platelets: 353 10*3/uL (ref 150–400)
RBC: 3.99 MIL/uL (ref 3.87–5.11)
RDW: 14.2 % (ref 11.5–15.5)
WBC: 9.2 10*3/uL (ref 4.0–10.5)

## 2014-08-04 LAB — URINALYSIS, ROUTINE W REFLEX MICROSCOPIC
BILIRUBIN URINE: NEGATIVE
Glucose, UA: NEGATIVE mg/dL
HGB URINE DIPSTICK: NEGATIVE
Ketones, ur: NEGATIVE mg/dL
Leukocytes, UA: NEGATIVE
Nitrite: NEGATIVE
PROTEIN: NEGATIVE mg/dL
Specific Gravity, Urine: 1.013 (ref 1.005–1.030)
UROBILINOGEN UA: 0.2 mg/dL (ref 0.0–1.0)
pH: 5.5 (ref 5.0–8.0)

## 2014-08-04 LAB — BASIC METABOLIC PANEL
Anion gap: 8 (ref 5–15)
BUN: 9 mg/dL (ref 6–23)
CO2: 28 mmol/L (ref 19–32)
CREATININE: 0.84 mg/dL (ref 0.50–1.10)
Calcium: 8.8 mg/dL (ref 8.4–10.5)
Chloride: 103 mmol/L (ref 96–112)
GFR calc Af Amer: >90 mL/min (ref >90)
GFR calc non Af Amer: 83 mL/min — ABNORMAL LOW (ref >90)
GLUCOSE: 102 mg/dL — AB (ref 70–99)
Potassium: 3.6 mmol/L (ref 3.5–5.1)
SODIUM: 139 mmol/L (ref 135–145)

## 2014-08-04 LAB — PREGNANCY, URINE: PREG TEST UR: NEGATIVE

## 2014-08-04 MED ORDER — FUROSEMIDE 10 MG/ML IJ SOLN
40.0000 mg | Freq: Once | INTRAMUSCULAR | Status: AC
Start: 2014-08-04 — End: 2014-08-04
  Administered 2014-08-04: 40 mg via INTRAVENOUS
  Filled 2014-08-04: qty 4

## 2014-08-04 NOTE — ED Notes (Signed)
Pt up to BR for urine spec

## 2014-08-04 NOTE — ED Notes (Signed)
Pt ambulating independently w/ steady gait on d/c in no acute distress, A&Ox4. D/c instructions reviewed w/ pt - pt denies any further questions or concerns at present.  

## 2014-08-04 NOTE — ED Notes (Signed)
Presents w/ c/o hands and feet swelling more than usual

## 2014-08-04 NOTE — ED Notes (Signed)
MD at bedside. 

## 2014-08-04 NOTE — ED Provider Notes (Signed)
CSN: [id number]     Arrival date & time 08/04/14  0718 History   First MD Initiated Contact with Patient 08/04/14 0744     No chief complaint on file.    (Consider location/radiation/quality/duration/timing/severity/associated sxs/prior Treatment) HPI Comments: Patient is a 44 year old female with history of morbid obesity, asthma, hypertension, fibromyalgia. She presents for evaluation of lower extremity swelling and pain. She states that both feet and ankles are swollen and causing her discomfort. There is been no injury or trauma. She denies any shortness of breath or chest pain. She takes Lasix and reports not having missed any doses of this.  The history is provided by the patient.    Past Medical History  Diagnosis Date  . Asthma   . Hypertension   . Fibromyalgia   . Neurogenic bladder   . Back pain, chronic   . Panic attacks   . Reflux   . Morbid obesity    Past Surgical History  Procedure Laterality Date  . Back surgery    . Carpal tunnel release    . Ureteral stent placement  2008  . Bladder stimulator     No family history on file. History  Substance Use Topics  . Smoking status: Former Research scientist (life sciences)  . Smokeless tobacco: Never Used     Comment: quit 2004  . Alcohol Use: No   OB History    Gravida Para Term Preterm AB TAB SAB Ectopic Multiple Living   6 4 4  0 2 0 2 0 0 4     Review of Systems  All other systems reviewed and are negative.     Allergies  Clindamycin/lincomycin; Ibuprofen; and Tomato  Home Medications   Prior to Admission medications   Medication Sig Start Date End Date Taking? Authorizing Provider  albuterol (PROVENTIL HFA;VENTOLIN HFA) 108 (90 BASE) MCG/ACT inhaler Inhale 2 puffs into the lungs every 6 (six) hours as needed. Takes for shortness of breath    Historical Provider, MD  ALPRAZolam Duanne Moron) 0.5 MG tablet Take 0.5 mg by mouth at bedtime as needed. Takes for depression    Historical Provider, MD  benzonatate (TESSALON) 100 MG  capsule Take 1 capsule (100 mg total) by mouth every 8 (eight) hours. 04/27/14   Waynetta Pean, PA-C  budesonide-formoterol (SYMBICORT) 160-4.5 MCG/ACT inhaler Inhale 2 puffs into the lungs 2 (two) times daily.    Historical Provider, MD  cyclobenzaprine (FLEXERIL) 10 MG tablet Take 10 mg by mouth 3 (three) times daily as needed. Takes for muscle spasms    Historical Provider, MD  diclofenac sodium (VOLTAREN) 1 % GEL Apply 2 g topically 4 (four) times daily.    Historical Provider, MD  furosemide (LASIX) 40 MG tablet Take 1 tablet (40 mg total) by mouth daily. 05/09/12   Verlee Monte, MD  ipratropium (ATROVENT) 0.06 % nasal spray Place 2 sprays into the nose 4 (four) times daily. 03/04/13   Billy Fischer, MD  losartan (COZAAR) 100 MG tablet Take 100 mg by mouth daily.    Historical Provider, MD  montelukast (SINGULAIR) 10 MG tablet Take 10 mg by mouth at bedtime.    Historical Provider, MD  OMEPRAZOLE PO Take 1 tablet by mouth daily.    Historical Provider, MD  oxyCODONE-acetaminophen (PERCOCET) 5-325 MG per tablet Take 1 tablet by mouth every 4 (four) hours as needed for moderate pain. 3/53/61   [name], MD  oxymorphone (OPANA ER) 20 MG 12 hr tablet Take 20 mg by mouth every 12 (twelve) hours.  Historical Provider, MD  pregabalin (LYRICA) 75 MG capsule Take 75 mg by mouth 3 (three) times daily.    Historical Provider, MD   BP 142/87 mmHg  Pulse 94  Temp(Src) 98 F (36.7 C) (Oral)  Resp 20  Ht 5' (1.524 m)  Wt 310 lb (140.615 kg)  BMI 60.54 kg/m2  SpO2 100% Physical Exam  Constitutional: She is oriented to person, place, and time. She appears well-developed and well-nourished. No distress.  HENT:  Head: Normocephalic and atraumatic.  Neck: Normal range of motion. Neck supple.  Cardiovascular: Normal rate and regular rhythm.  Exam reveals no gallop and no friction rub.   No murmur heard. Pulmonary/Chest: Effort normal and breath sounds normal. No respiratory distress. She has no  wheezes.  Abdominal: Soft. Bowel sounds are normal. She exhibits no distension. There is no tenderness.  Musculoskeletal: Normal range of motion. She exhibits edema.  There is 1+ edema of the bilateral lower extremities. Dorsalis be his pulses are easily palpable bilaterally.  Neurological: She is alert and oriented to person, place, and time.  Skin: Skin is warm and dry. She is not diaphoretic.  Nursing note and vitals reviewed.   ED Course  Procedures (including critical care time) Labs Review Labs Reviewed  BASIC METABOLIC PANEL  CBC WITH DIFFERENTIAL/PLATELET  URINALYSIS, ROUTINE W REFLEX MICROSCOPIC  PREGNANCY, URINE    Imaging Review No results found.   EKG Interpretation None      MDM   Final diagnoses:  None    Patient is a 44 year old female who presents with complaints of swelling of her hands and feet and ankles worse over the past week. She is morbidly obese and takes Lasix for fluid retention. She was given an additional IV dose of Lasix with good diuresis. Her workup shows no significant abnormality. She has normal renal function and urinalysis is clear. I will recommend an increased dose of Lasix for the next several days. She is to follow-up with her PCP to discuss weight loss and lifestyle modification.    Veryl Speak, MD 08/04/14 [phone]

## 2014-08-04 NOTE — ED Notes (Signed)
Presents to ED w/ c/o "my feet and hands are swollen", states has pain in legs to thighs when trying to ambulate

## 2014-08-04 NOTE — Discharge Instructions (Signed)
Increase your Lasix to 40 mg twice daily for the next 3 days.  Follow-up with your primary Dr. to discuss weight loss and dietary modification.

## 2014-09-10 ENCOUNTER — Encounter (HOSPITAL_COMMUNITY): Payer: Self-pay | Admitting: Emergency Medicine

## 2014-09-10 ENCOUNTER — Emergency Department (HOSPITAL_BASED_OUTPATIENT_CLINIC_OR_DEPARTMENT_OTHER)
Admission: EM | Admit: 2014-09-10 | Discharge: 2014-09-11 | Disposition: A | Discharge status: Home / Self Care | Payer: Medicaid Other | Attending: Emergency Medicine | Admitting: Emergency Medicine

## 2014-09-10 ENCOUNTER — Emergency Department (HOSPITAL_COMMUNITY)
Admission: EM | Admit: 2014-09-10 | Discharge: 2014-09-10 | Disposition: A | Discharge status: Home / Self Care | Payer: Medicaid Other | Source: Home / Self Care

## 2014-09-10 ENCOUNTER — Encounter (HOSPITAL_BASED_OUTPATIENT_CLINIC_OR_DEPARTMENT_OTHER): Payer: Self-pay | Admitting: *Deleted

## 2014-09-10 DIAGNOSIS — M797 Fibromyalgia: Secondary | ICD-10-CM | POA: Diagnosis not present

## 2014-09-10 DIAGNOSIS — Y998 Other external cause status: Secondary | ICD-10-CM | POA: Diagnosis not present

## 2014-09-10 DIAGNOSIS — S8002XA Contusion of left knee, initial encounter: Secondary | ICD-10-CM

## 2014-09-10 DIAGNOSIS — I10 Essential (primary) hypertension: Secondary | ICD-10-CM | POA: Diagnosis not present

## 2014-09-10 DIAGNOSIS — W010XXA Fall on same level from slipping, tripping and stumbling without subsequent striking against object, initial encounter: Secondary | ICD-10-CM | POA: Diagnosis not present

## 2014-09-10 DIAGNOSIS — Z791 Long term (current) use of non-steroidal anti-inflammatories (NSAID): Secondary | ICD-10-CM | POA: Diagnosis not present

## 2014-09-10 DIAGNOSIS — Y9301 Activity, walking, marching and hiking: Secondary | ICD-10-CM | POA: Diagnosis not present

## 2014-09-10 DIAGNOSIS — Z87891 Personal history of nicotine dependence: Secondary | ICD-10-CM | POA: Insufficient documentation

## 2014-09-10 DIAGNOSIS — M549 Dorsalgia, unspecified: Secondary | ICD-10-CM

## 2014-09-10 DIAGNOSIS — Z7951 Long term (current) use of inhaled steroids: Secondary | ICD-10-CM | POA: Insufficient documentation

## 2014-09-10 DIAGNOSIS — W19XXXA Unspecified fall, initial encounter: Secondary | ICD-10-CM

## 2014-09-10 DIAGNOSIS — Y9289 Other specified places as the place of occurrence of the external cause: Secondary | ICD-10-CM | POA: Insufficient documentation

## 2014-09-10 DIAGNOSIS — F410 Panic disorder [episodic paroxysmal anxiety] without agoraphobia: Secondary | ICD-10-CM | POA: Insufficient documentation

## 2014-09-10 DIAGNOSIS — G8929 Other chronic pain: Secondary | ICD-10-CM | POA: Diagnosis not present

## 2014-09-10 DIAGNOSIS — Z9889 Other specified postprocedural states: Secondary | ICD-10-CM | POA: Insufficient documentation

## 2014-09-10 DIAGNOSIS — S3992XA Unspecified injury of lower back, initial encounter: Secondary | ICD-10-CM | POA: Insufficient documentation

## 2014-09-10 DIAGNOSIS — J45909 Unspecified asthma, uncomplicated: Secondary | ICD-10-CM | POA: Diagnosis not present

## 2014-09-10 DIAGNOSIS — Z79899 Other long term (current) drug therapy: Secondary | ICD-10-CM | POA: Insufficient documentation

## 2014-09-10 DIAGNOSIS — Z23 Encounter for immunization: Secondary | ICD-10-CM | POA: Insufficient documentation

## 2014-09-10 DIAGNOSIS — Z3202 Encounter for pregnancy test, result negative: Secondary | ICD-10-CM | POA: Insufficient documentation

## 2014-09-10 MED ORDER — TETANUS-DIPHTH-ACELL PERTUSSIS 5-2.5-18.5 LF-MCG/0.5 IM SUSP
0.5000 mL | Freq: Once | INTRAMUSCULAR | Status: AC
  Administered 2014-09-11: 0.5 mL via INTRAMUSCULAR
  Filled 2014-09-10: qty 0.5
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
## 2014-09-10 NOTE — ED Notes (Signed)
Pt came to nurse first desk to advise she is leaving ED.

## 2014-09-10 NOTE — ED Notes (Signed)
Pt. tripped on a carpet and fell this evening , no LOC / ambulatory , reports pain ay bilateral knees and left elbow , alert and oriented/ respirations unlabored . Hypertensive at triage .

## 2014-09-10 NOTE — ED Notes (Signed)
Pt c/o fall at [facility] c/o back pain
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
## 2014-09-11 ENCOUNTER — Emergency Department (HOSPITAL_BASED_OUTPATIENT_CLINIC_OR_DEPARTMENT_OTHER): Payer: Medicaid Other

## 2014-09-11 DIAGNOSIS — S3992XA Unspecified injury of lower back, initial encounter: Secondary | ICD-10-CM | POA: Diagnosis not present

## 2014-09-11 LAB — PREGNANCY, URINE: PREG TEST UR: NEGATIVE

## 2014-09-11 NOTE — ED Notes (Signed)
MD at bedside discussing test results and dispo plan of care. 

## 2014-10-31 ENCOUNTER — Emergency Department (HOSPITAL_BASED_OUTPATIENT_CLINIC_OR_DEPARTMENT_OTHER)
Admission: EM | Admit: 2014-10-31 | Discharge: 2014-10-31 | Disposition: A | Discharge status: Home / Self Care | Payer: Medicaid Other | Attending: Emergency Medicine | Admitting: Emergency Medicine

## 2014-10-31 ENCOUNTER — Encounter (HOSPITAL_BASED_OUTPATIENT_CLINIC_OR_DEPARTMENT_OTHER): Payer: Self-pay

## 2014-10-31 ENCOUNTER — Emergency Department (HOSPITAL_BASED_OUTPATIENT_CLINIC_OR_DEPARTMENT_OTHER): Payer: Medicaid Other

## 2014-10-31 DIAGNOSIS — I10 Essential (primary) hypertension: Secondary | ICD-10-CM | POA: Diagnosis not present

## 2014-10-31 DIAGNOSIS — Z87828 Personal history of other (healed) physical injury and trauma: Secondary | ICD-10-CM | POA: Diagnosis not present

## 2014-10-31 DIAGNOSIS — Z7951 Long term (current) use of inhaled steroids: Secondary | ICD-10-CM | POA: Diagnosis not present

## 2014-10-31 DIAGNOSIS — Z87891 Personal history of nicotine dependence: Secondary | ICD-10-CM | POA: Insufficient documentation

## 2014-10-31 DIAGNOSIS — Z9889 Other specified postprocedural states: Secondary | ICD-10-CM | POA: Diagnosis not present

## 2014-10-31 DIAGNOSIS — K219 Gastro-esophageal reflux disease without esophagitis: Secondary | ICD-10-CM | POA: Insufficient documentation

## 2014-10-31 DIAGNOSIS — M797 Fibromyalgia: Secondary | ICD-10-CM | POA: Insufficient documentation

## 2014-10-31 DIAGNOSIS — J45909 Unspecified asthma, uncomplicated: Secondary | ICD-10-CM | POA: Diagnosis not present

## 2014-10-31 DIAGNOSIS — Z791 Long term (current) use of non-steroidal anti-inflammatories (NSAID): Secondary | ICD-10-CM | POA: Insufficient documentation

## 2014-10-31 DIAGNOSIS — M25562 Pain in left knee: Secondary | ICD-10-CM | POA: Diagnosis not present

## 2014-10-31 DIAGNOSIS — G8929 Other chronic pain: Secondary | ICD-10-CM | POA: Diagnosis not present

## 2014-10-31 DIAGNOSIS — M549 Dorsalgia, unspecified: Secondary | ICD-10-CM

## 2014-10-31 DIAGNOSIS — Z79899 Other long term (current) drug therapy: Secondary | ICD-10-CM | POA: Diagnosis not present

## 2014-10-31 DIAGNOSIS — M545 Low back pain: Secondary | ICD-10-CM | POA: Diagnosis present

## 2014-10-31 DIAGNOSIS — M25561 Pain in right knee: Secondary | ICD-10-CM | POA: Diagnosis not present

## 2014-10-31 DIAGNOSIS — F410 Panic disorder [episodic paroxysmal anxiety] without agoraphobia: Secondary | ICD-10-CM | POA: Diagnosis not present

## 2014-10-31 NOTE — ED Notes (Signed)
Pt states she feel at [facility] 5/17-cont'd pain bilat knee pain and lower back-pt with steady gait to triage-also states "i got a cold"

## 2014-10-31 NOTE — Discharge Instructions (Signed)
Please read and follow all provided instructions.  Your diagnoses today include:  1. Back pain   2. Knee pain, acute, left   3. Knee pain, acute, right    Tests performed today include:  Vital signs - see below for your results today  Medications prescribed:   None  Take any prescribed medications only as directed.  Home care instructions:   Follow any educational materials contained in this packet  Please rest, use ice or heat on your back for the next several days  Do not lift, push, pull anything more than 10 pounds for the next week  Follow-up instructions: Please follow-up with your primary care provider in the next 1 week for further evaluation of your symptoms.   Return instructions:  SEEK IMMEDIATE MEDICAL ATTENTION IF YOU HAVE:  New numbness, tingling, weakness, or problem with the use of your arms or legs  Severe back pain not relieved with medications  Loss control of your bowels or bladder  Increasing pain in any areas of the body (such as chest or abdominal pain)  Shortness of breath, dizziness, or fainting.   Worsening nausea (feeling sick to your stomach), vomiting, fever, or sweats  Any other emergent concerns regarding your health   Additional Information:  Your vital signs today were: BP 166/80 mmHg   Pulse 100   Temp(Src) 97.9 F (36.6 C) (Oral)   Resp 20   Ht 5' (1.524 m)   Wt 305 lb (138.347 kg)   BMI 59.57 kg/m2   SpO2 98%   LMP 09/03/2014 If your blood pressure (BP) was elevated above 135/85 this visit, please have this repeated by your doctor within one month. --------------

## 2014-10-31 NOTE — ED Notes (Signed)
MD at bedside. 

## 2014-10-31 NOTE — ED Notes (Signed)
lower lumbar pain radiating to posterior aspect of bilateral legs- S/P L5 fusion "a long time ago" per pt, recent fall in [place] on 09/14/14 and in pain management for back and knee pain.  Medications (percocet, opana and flexeril) not effective per pt and states when she sits for long periods of time her legs feel numb then symptom resolves with standing. Denies saddle anesthesia or incontinence.  Ambulating in room with limping gait.  Denies SOB. Requesting an xray

## 2014-10-31 NOTE — ED Provider Notes (Signed)
CSN: [id number]     Arrival date & time 10/31/14  1106 History   First MD Initiated Contact with Patient 10/31/14 1202     Chief Complaint  Patient presents with  . Fall     (Consider location/radiation/quality/duration/timing/severity/associated sxs/prior Treatment) HPI Comments: Patient with history of chronic back and knee pain, previous lower back surgery, presents with continued and persistent bilateral knee and back pain since a fall in May 2016. Patient states that her pain is improved when she is taking her home Percocet, Opana, Flexeril however the pain returns when the medication wears off. She describes back pain that goes down her legs and over her knees. She has some numbness at times. Patient denies warning symptoms of back pain including: fecal incontinence, urinary retention or overflow incontinence, night sweats, waking from sleep with back pain, unexplained fevers or weight loss, h/o cancer, IVDU, recent trauma.  Patient denies warning symptoms of back pain including: fecal incontinence, urinary retention or overflow incontinence, night sweats, waking from sleep with back pain, unexplained fevers or weight loss, h/o cancer, IVDU, significant trauma. She denies other medical complaints.   The history is provided by the patient.    Past Medical History  Diagnosis Date  . Asthma   . Hypertension   . Fibromyalgia   . Neurogenic bladder   . Back pain, chronic   . Panic attacks   . Reflux   . Morbid obesity    Past Surgical History  Procedure Laterality Date  . Back surgery    . Carpal tunnel release    . Ureteral stent placement  2008  . Bladder stimulator     No family history on file. History  Substance Use Topics  . Smoking status: Former Research scientist (life sciences)  . Smokeless tobacco: Never Used     Comment: quit 2004  . Alcohol Use: No   OB History    Gravida Para Term Preterm AB TAB SAB Ectopic Multiple Living   6 4 4  0 2 0 2 0 0 4     Review of Systems  Constitutional:  Negative for fever and unexpected weight change.  Gastrointestinal: Negative for constipation.       Negative for fecal incontinence.   Genitourinary: Negative for dysuria, hematuria, flank pain, vaginal bleeding, vaginal discharge and pelvic pain.       Negative for urinary incontinence or retention.  Musculoskeletal: Positive for myalgias, back pain and arthralgias. Negative for gait problem.  Neurological: Positive for numbness. Negative for weakness.       Denies saddle paresthesias.      Allergies  Clindamycin/lincomycin; Ibuprofen; and Tomato  Home Medications   Prior to Admission medications   Medication Sig Start Date End Date Taking? Authorizing Provider  LISINOPRIL PO Take by mouth.   Yes Historical Provider, MD  albuterol (PROVENTIL HFA;VENTOLIN HFA) 108 (90 BASE) MCG/ACT inhaler Inhale 2 puffs into the lungs every 6 (six) hours as needed. Takes for shortness of breath    Historical Provider, MD  ALPRAZolam Duanne Moron) 0.5 MG tablet Take 0.5 mg by mouth at bedtime as needed. Takes for depression    Historical Provider, MD  budesonide-formoterol (SYMBICORT) 160-4.5 MCG/ACT inhaler Inhale 2 puffs into the lungs 2 (two) times daily.    Historical Provider, MD  cyclobenzaprine (FLEXERIL) 10 MG tablet Take 10 mg by mouth 3 (three) times daily as needed. Takes for muscle spasms    Historical Provider, MD  diclofenac sodium (VOLTAREN) 1 % GEL Apply 2 g topically 4 (four) times  daily.    Historical Provider, MD  furosemide (LASIX) 40 MG tablet Take 1 tablet (40 mg total) by mouth daily. 05/09/12   Verlee Monte, MD  ipratropium (ATROVENT) 0.06 % nasal spray Place 2 sprays into the nose 4 (four) times daily. 03/04/13   Billy Fischer, MD  montelukast (SINGULAIR) 10 MG tablet Take 10 mg by mouth at bedtime.    Historical Provider, MD  OMEPRAZOLE PO Take 1 tablet by mouth daily.    Historical Provider, MD  oxyCODONE-acetaminophen (PERCOCET) 5-325 MG per tablet Take 1 tablet by mouth every 4  (four) hours as needed for moderate pain. 9/70/26   [name], MD  oxymorphone (OPANA ER) 20 MG 12 hr tablet Take 20 mg by mouth every 12 (twelve) hours.    Historical Provider, MD  pregabalin (LYRICA) 75 MG capsule Take 75 mg by mouth 3 (three) times daily.    Historical Provider, MD   BP 166/80 mmHg  Pulse 100  Temp(Src) 97.9 F (36.6 C) (Oral)  Resp 20  Ht 5' (1.524 m)  Wt 305 lb (138.347 kg)  BMI 59.57 kg/m2  SpO2 98%  LMP 09/03/2014 Physical Exam  Constitutional: She appears well-developed and well-nourished.  HENT:  Head: Normocephalic and atraumatic.  Eyes: Conjunctivae are normal.  Neck: Normal range of motion. Neck supple.  Pulmonary/Chest: Effort normal.  Abdominal: Soft. There is no tenderness. There is no CVA tenderness.  Musculoskeletal: Normal range of motion.  No step-off noted with palpation of spine. Healing midline lumbar surgical scar. Patient with paraspinous lumbar muscular tenderness to palpation.  Neurological: She is alert. She has normal strength and normal reflexes. No sensory deficit.  5/5 strength in entire lower extremities bilaterally. No sensation deficit. Patient is able to stand from a sitting position and walk without difficulty. No foot drop.  Skin: Skin is warm and dry. No rash noted.  Psychiatric: She has a normal mood and affect.  Nursing note and vitals reviewed.   ED Course  Procedures (including critical care time) Labs Review Labs Reviewed - No data to display  Imaging Review Dg Lumbar Spine Complete  10/31/2014   CLINICAL DATA:  Fall in 09/10/2014, bilateral leg numbness, anterior bilateral knee pain  EXAM: LUMBAR SPINE - COMPLETE 4+ VIEW  COMPARISON:  09/11/2014  FINDINGS: Five views of lumbar spine submitted. No acute fracture or subluxation. Again noted posterior fusion with posterior rods and transpedicular screws at L4, L5 and S1 level. The alignment is preserved. Minimal disc space flattening at L3-L4 level. Sacral wires  stimulator are noted.  IMPRESSION: No acute fracture or subluxation. Stable postsurgical changes with posterior fusion L4, L5 and S1 level.   Electronically Signed   By: Lahoma Crocker M.D.   On: 10/31/2014 13:17   Dg Knee Complete 4 Views Left  10/31/2014   CLINICAL DATA:  Low back pain, fall 09/10/2014, bilateral knee pain  EXAM: LEFT KNEE - COMPLETE 4+ VIEW  COMPARISON:  Right knee same day  FINDINGS: Four views of the left knee submitted. No acute fracture or subluxation. Mild narrowing of medial joint compartment. No joint effusion. Mild narrowing of patellofemoral joint space.  IMPRESSION: No acute fracture or subluxation. There is mild narrowing of medial joint compartment. Mild narrowing of patellofemoral joint space.   Electronically Signed   By: Lahoma Crocker M.D.   On: 10/31/2014 13:23   Dg Knee Complete 4 Views Right  10/31/2014   CLINICAL DATA:  Bilateral knee pain, fall 09/10/2014  EXAM: RIGHT KNEE -  COMPLETE 4+ VIEW  COMPARISON:  Left knee same day  FINDINGS: Four views of the right knee submitted. There is significant narrowing of medial joint compartment. Mild narrowing of patellofemoral joint space. Trace joint effusion. No acute fracture or subluxation.  IMPRESSION: No acute fracture or subluxation. Degenerative changes as described above.   Electronically Signed   By: Lahoma Crocker M.D.   On: 10/31/2014 13:24     EKG Interpretation None       12:30 PM Patient seen and examined. Patient is anxious about her back and knees and requests x-rays. Will obtain at patient request.   Vital signs reviewed and are as follows: BP 166/80 mmHg  Pulse 100  Temp(Src) 97.9 F (36.6 C) (Oral)  Resp 20  Ht 5' (1.524 m)  Wt 305 lb (138.347 kg)  BMI 59.57 kg/m2  SpO2 98%  LMP 09/03/2014  1:46 PM X-rays are neg. Pt informed. Counseled on RICE. She will continue her chronic pain medication.   MDM   Final diagnoses:  Back pain   Patient with back and knee pain, chronic in nature. No neurological  deficits. Patient is ambulatory. No warning symptoms of back pain including: fecal incontinence, urinary retention or overflow incontinence, night sweats, waking from sleep with back pain, unexplained fevers or weight loss, h/o cancer, IVDU, recent trauma. No concern for cauda equina, epidural abscess, or other serious cause of back pain. Conservative measures such as rest, ice/heat and pain medicine indicated with PCP follow-up if no improvement with conservative management.      Carlisle Cater, PA-C 10/31/14 [place], MD 11/01/14 [phone]

## 2014-12-21 ENCOUNTER — Encounter (HOSPITAL_BASED_OUTPATIENT_CLINIC_OR_DEPARTMENT_OTHER): Payer: Self-pay | Admitting: *Deleted

## 2014-12-21 ENCOUNTER — Emergency Department (HOSPITAL_BASED_OUTPATIENT_CLINIC_OR_DEPARTMENT_OTHER): Payer: Medicaid Other

## 2014-12-21 ENCOUNTER — Emergency Department (HOSPITAL_BASED_OUTPATIENT_CLINIC_OR_DEPARTMENT_OTHER)
Admission: EM | Admit: 2014-12-21 | Discharge: 2014-12-21 | Disposition: A | Discharge status: Home / Self Care | Payer: Medicaid Other | Attending: Physician Assistant | Admitting: Physician Assistant

## 2014-12-21 DIAGNOSIS — K219 Gastro-esophageal reflux disease without esophagitis: Secondary | ICD-10-CM | POA: Diagnosis not present

## 2014-12-21 DIAGNOSIS — Z3202 Encounter for pregnancy test, result negative: Secondary | ICD-10-CM | POA: Insufficient documentation

## 2014-12-21 DIAGNOSIS — J45909 Unspecified asthma, uncomplicated: Secondary | ICD-10-CM | POA: Insufficient documentation

## 2014-12-21 DIAGNOSIS — G8929 Other chronic pain: Secondary | ICD-10-CM | POA: Diagnosis not present

## 2014-12-21 DIAGNOSIS — I10 Essential (primary) hypertension: Secondary | ICD-10-CM | POA: Insufficient documentation

## 2014-12-21 DIAGNOSIS — R300 Dysuria: Secondary | ICD-10-CM | POA: Insufficient documentation

## 2014-12-21 DIAGNOSIS — F410 Panic disorder [episodic paroxysmal anxiety] without agoraphobia: Secondary | ICD-10-CM | POA: Insufficient documentation

## 2014-12-21 DIAGNOSIS — Z79899 Other long term (current) drug therapy: Secondary | ICD-10-CM | POA: Insufficient documentation

## 2014-12-21 DIAGNOSIS — M797 Fibromyalgia: Secondary | ICD-10-CM | POA: Diagnosis not present

## 2014-12-21 DIAGNOSIS — Z87891 Personal history of nicotine dependence: Secondary | ICD-10-CM | POA: Insufficient documentation

## 2014-12-21 DIAGNOSIS — R109 Unspecified abdominal pain: Secondary | ICD-10-CM

## 2014-12-21 DIAGNOSIS — Z87448 Personal history of other diseases of urinary system: Secondary | ICD-10-CM | POA: Diagnosis not present

## 2014-12-21 DIAGNOSIS — R1084 Generalized abdominal pain: Secondary | ICD-10-CM

## 2014-12-21 LAB — COMPREHENSIVE METABOLIC PANEL
ALBUMIN: 3.6 g/dL (ref 3.5–5.0)
ALK PHOS: 58 U/L (ref 38–126)
ALT: 20 U/L (ref 14–54)
AST: 20 U/L (ref 15–41)
Anion gap: 9 (ref 5–15)
BUN: 7 mg/dL (ref 6–20)
CALCIUM: 8.9 mg/dL (ref 8.9–10.3)
CO2: 27 mmol/L (ref 22–32)
CREATININE: 0.8 mg/dL (ref 0.44–1.00)
Chloride: 105 mmol/L (ref 101–111)
GFR calc non Af Amer: >60 mL/min (ref >60)
GLUCOSE: 105 mg/dL — AB (ref 65–99)
Potassium: 3.6 mmol/L (ref 3.5–5.1)
Sodium: 141 mmol/L (ref 135–145)
Total Bilirubin: 0.5 mg/dL (ref 0.3–1.2)
Total Protein: 7.1 g/dL (ref 6.5–8.1)

## 2014-12-21 LAB — CBC WITH DIFFERENTIAL/PLATELET
Basophils Absolute: 0 10*3/uL (ref 0.0–0.1)
Basophils Relative: 0 % (ref 0–1)
Eosinophils Absolute: 0.2 10*3/uL (ref 0.0–0.7)
Eosinophils Relative: 2 % (ref 0–5)
HCT: 35.1 % — ABNORMAL LOW (ref 36.0–46.0)
HEMOGLOBIN: 11.3 g/dL — AB (ref 12.0–15.0)
LYMPHS ABS: 2.7 10*3/uL (ref 0.7–4.0)
Lymphocytes Relative: 30 % (ref 12–46)
MCH: 28.3 pg (ref 26.0–34.0)
MCHC: 32.2 g/dL (ref 30.0–36.0)
MCV: 88 fL (ref 78.0–100.0)
Monocytes Absolute: 0.4 10*3/uL (ref 0.1–1.0)
Monocytes Relative: 4 % (ref 3–12)
NEUTROS ABS: 5.6 10*3/uL (ref 1.7–7.7)
NEUTROS PCT: 64 % (ref 43–77)
Platelets: 361 10*3/uL (ref 150–400)
RBC: 3.99 MIL/uL (ref 3.87–5.11)
RDW: 13.9 % (ref 11.5–15.5)
WBC: 8.9 10*3/uL (ref 4.0–10.5)

## 2014-12-21 LAB — URINALYSIS, ROUTINE W REFLEX MICROSCOPIC
Bilirubin Urine: NEGATIVE
Glucose, UA: NEGATIVE mg/dL
Hgb urine dipstick: NEGATIVE
KETONES UR: NEGATIVE mg/dL
Leukocytes, UA: NEGATIVE
Nitrite: NEGATIVE
PROTEIN: 30 mg/dL — AB
Specific Gravity, Urine: 1.017 (ref 1.005–1.030)
UROBILINOGEN UA: 1 mg/dL (ref 0.0–1.0)
pH: 7 (ref 5.0–8.0)

## 2014-12-21 LAB — WET PREP, GENITAL
Clue Cells Wet Prep HPF POC: NONE SEEN
Trich, Wet Prep: NONE SEEN
WBC, Wet Prep HPF POC: NONE SEEN
Yeast Wet Prep HPF POC: NONE SEEN

## 2014-12-21 LAB — LIPASE, BLOOD: Lipase: 41 U/L (ref 22–51)

## 2014-12-21 LAB — URINE MICROSCOPIC-ADD ON

## 2014-12-21 LAB — PREGNANCY, URINE: PREG TEST UR: NEGATIVE

## 2014-12-21 MED ORDER — HYDROMORPHONE HCL 1 MG/ML IJ SOLN
1.0000 mg | Freq: Once | INTRAMUSCULAR | Status: AC
  Administered 2014-12-21: 1 mg via INTRAMUSCULAR
  Filled 2014-12-21: qty 1

## 2014-12-21 NOTE — Discharge Instructions (Signed)
1. Medications: home pain medications, usual home medications 2. Treatment: rest, drink plenty of fluids,  3. Follow Up: Please followup with your primary doctor in 2 days for discussion of your diagnoses and further evaluation after today's visit; if you do not have a primary care doctor use the resource guide provided to find one; Please return to the ER for worsening symptoms    Abdominal Pain Many things can cause abdominal pain. Usually, abdominal pain is not caused by a disease and will improve without treatment. It can often be observed and treated at home. Your health care provider will do a physical exam and possibly order blood tests and X-rays to help determine the seriousness of your pain. However, in many cases, more time must pass before a clear cause of the pain can be found. Before that point, your health care provider may not know if you need more testing or further treatment. HOME CARE INSTRUCTIONS  Monitor your abdominal pain for any changes. The following actions may help to alleviate any discomfort you are experiencing:  Only take over-the-counter or prescription medicines as directed by your health care provider.  Do not take laxatives unless directed to do so by your health care provider.  Try a clear liquid diet (broth, tea, or water) as directed by your health care provider. Slowly move to a bland diet as tolerated. SEEK MEDICAL CARE IF:  You have unexplained abdominal pain.  You have abdominal pain associated with nausea or diarrhea.  You have pain when you urinate or have a bowel movement.  You experience abdominal pain that wakes you in the night.  You have abdominal pain that is worsened or improved by eating food.  You have abdominal pain that is worsened with eating fatty foods.  You have a fever. SEEK IMMEDIATE MEDICAL CARE IF:   Your pain does not go away within 2 hours.  You keep throwing up (vomiting).  Your pain is felt only in portions of the  abdomen, such as the right side or the left lower portion of the abdomen.  You pass bloody or black tarry stools. MAKE SURE YOU:  Understand these instructions.   Will watch your condition.   Will get help right away if you are not doing well or get worse.  Document Released: 01/20/2005 Document Revised: 04/17/2013 Document Reviewed: 12/20/2012 [facility] Patient Information 2015 [place], [place]. This information is not intended to replace advice given to you by your health care provider. Make sure you discuss any questions you have with your health care provider.

## 2014-12-21 NOTE — ED Notes (Signed)
CMS of LUE evaluated, Lt radial and Lt ulnar pulse easily palpated, 2+ for both sites, cap refill wnl, motor and sensory wnl also.

## 2014-12-21 NOTE — ED Provider Notes (Signed)
CSN: [id number]     Arrival date & time 12/21/14  1431 History   First MD Initiated Contact with Patient 12/21/14 1654     Chief Complaint  Patient presents with  . Abdominal Pain     (Consider location/radiation/quality/duration/timing/severity/associated sxs/prior Treatment) Patient is a 44 y.o. female presenting with abdominal pain. The history is provided by the patient and medical records. No language interpreter was used.  Abdominal Pain Associated symptoms: dysuria   Associated symptoms: no chest pain, no constipation, no cough, no diarrhea, no fatigue, no fever, no hematuria, no nausea, no shortness of breath and no vomiting      Landi Reesman is a 44 y.o. female  with a hx of asthma, HTN, fibromyalgia, neurogenic bladder, panic attacks, GERD, morbid obesity presents to the Emergency Department complaining of gradual, persistent, progressively worsening generalized abd pain onset yesterday morning.  Pt reports the pain is worse in the left.  Pt reports 2 BMs today without diarrhea, melena or hemtaochezia.  Pt reports she has been taking percocet and opana without relief.  Pt reports she has chronic back pain with fusion of L4-L5.  Pt denies fever, chills, headache, neck pain, chest pain, SOB, nausea, vomiting, weakness, dizziness.  Associated symptoms include dysuria.  Pt denies hx of abd surgeries.  No aggravating or alleviating factors.      Past Medical History  Diagnosis Date  . Asthma   . Hypertension   . Fibromyalgia   . Neurogenic bladder   . Back pain, chronic   . Panic attacks   . Reflux   . Morbid obesity    Past Surgical History  Procedure Laterality Date  . Back surgery    . Carpal tunnel release    . Ureteral stent placement  2008  . Bladder stimulator     No family history on file. Social History  Substance Use Topics  . Smoking status: Former Research scientist (life sciences)  . Smokeless tobacco: Never Used     Comment: quit 2004  . Alcohol Use: No   OB History    Gravida  Para Term Preterm AB TAB SAB Ectopic Multiple Living   6 4 4  0 2 0 2 0 0 4     Review of Systems  Constitutional: Negative for fever, diaphoresis, appetite change, fatigue and unexpected weight change.  HENT: Negative for mouth sores.   Eyes: Negative for visual disturbance.  Respiratory: Negative for cough, chest tightness, shortness of breath and wheezing.   Cardiovascular: Negative for chest pain.  Gastrointestinal: Positive for abdominal pain. Negative for nausea, vomiting, diarrhea and constipation.  Endocrine: Negative for polydipsia, polyphagia and polyuria.  Genitourinary: Positive for dysuria. Negative for urgency, frequency and hematuria.  Musculoskeletal: Negative for back pain and neck stiffness.  Skin: Negative for rash.  Allergic/Immunologic: Negative for immunocompromised state.  Neurological: Negative for syncope, light-headedness and headaches.  Hematological: Does not bruise/bleed easily.  Psychiatric/Behavioral: Negative for sleep disturbance. The patient is not nervous/anxious.       Allergies  Clindamycin/lincomycin; Ibuprofen; and Tomato  Home Medications   Prior to Admission medications   Medication Sig Start Date End Date Taking? Authorizing Provider  albuterol (PROVENTIL HFA;VENTOLIN HFA) 108 (90 BASE) MCG/ACT inhaler Inhale 2 puffs into the lungs every 6 (six) hours as needed. Takes for shortness of breath   Yes Historical Provider, MD  ALPRAZolam (XANAX) 0.5 MG tablet Take 0.5 mg by mouth at bedtime as needed. Takes for depression   Yes Historical Provider, MD  budesonide-formoterol (SYMBICORT) 160-4.5 MCG/ACT  inhaler Inhale 2 puffs into the lungs 2 (two) times daily.   Yes Historical Provider, MD  cyclobenzaprine (FLEXERIL) 10 MG tablet Take 10 mg by mouth 3 (three) times daily as needed. Takes for muscle spasms   Yes Historical Provider, MD  diclofenac sodium (VOLTAREN) 1 % GEL Apply 2 g topically 4 (four) times daily.   Yes Historical Provider, MD   furosemide (LASIX) 40 MG tablet Take 1 tablet (40 mg total) by mouth daily. 05/09/12  Yes Verlee Monte, MD  hydrALAZINE (APRESOLINE) 25 MG tablet Take 100 mg by mouth daily.   Yes Historical Provider, MD  ipratropium (ATROVENT) 0.06 % nasal spray Place 2 sprays into the nose 4 (four) times daily. 03/04/13  Yes Billy Fischer, MD  LISINOPRIL PO Take 40 mg by mouth daily.    Yes Historical Provider, MD  montelukast (SINGULAIR) 10 MG tablet Take 10 mg by mouth at bedtime.   Yes Historical Provider, MD  OMEPRAZOLE PO Take 1 tablet by mouth daily.   Yes Historical Provider, MD  oxyCODONE-acetaminophen (PERCOCET) 5-325 MG per tablet Take 1 tablet by mouth every 4 (four) hours as needed for moderate pain. 2/62/03  Yes [name], MD  oxymorphone (OPANA ER) 20 MG 12 hr tablet Take 20 mg by mouth every 12 (twelve) hours.   Yes Historical Provider, MD  pregabalin (LYRICA) 75 MG capsule Take 75 mg by mouth 3 (three) times daily.   Yes Historical Provider, MD   BP 158/105 mmHg  Pulse 94  Temp(Src) 98.2 F (36.8 C) (Oral)  Resp 18  Ht 5' (1.524 m)  Wt 301 lb (136.533 kg)  BMI 58.79 kg/m2  SpO2 100%  LMP 11/16/2014 Physical Exam  Constitutional: She appears well-developed and well-nourished. No distress.  Awake, alert, nontoxic appearance  HENT:  Head: Normocephalic and atraumatic.  Mouth/Throat: Oropharynx is clear and moist. No oropharyngeal exudate.  Eyes: Conjunctivae are normal. No scleral icterus.  Neck: Normal range of motion. Neck supple.  Full ROM without pain  Cardiovascular: Normal rate, regular rhythm, normal heart sounds and intact distal pulses.   No murmur heard. Pulmonary/Chest: Effort normal and breath sounds normal. No respiratory distress. She has no wheezes.  Equal chest expansion  Abdominal: Soft. Bowel sounds are normal. She exhibits no distension and no mass. There is tenderness. There is no rebound and no guarding. Hernia confirmed negative in the right inguinal area and  confirmed negative in the left inguinal area.  Genitourinary: Uterus normal. No labial fusion. There is no rash, tenderness or lesion on the right labia. There is no rash, tenderness or lesion on the left labia. Uterus is not deviated, not enlarged, not fixed and not tender. Cervix exhibits no motion tenderness, no discharge and no friability. Right adnexum displays no mass, no tenderness and no fullness. Left adnexum displays no mass, no tenderness and no fullness. No erythema, tenderness or bleeding in the vagina. No foreign body around the vagina. No signs of injury around the vagina. Vaginal discharge (thick, white, minimal) found.  Musculoskeletal: Normal range of motion. She exhibits no edema.  Full range of motion of the T-spine and L-spine No tenderness to palpation of the spinous processes of the T-spine or L-spine Tenderness to palpation of the left paraspinous muscles of the T and L-spine Large, well-healed surgical scars noted along the T and L-spine  Lymphadenopathy:    She has no cervical adenopathy.       Right: No inguinal adenopathy present.  Left: No inguinal adenopathy present.  Neurological: She is alert. She has normal reflexes.  Reflex Scores:      Bicep reflexes are 2+ on the right side and 2+ on the left side.      Brachioradialis reflexes are 2+ on the right side and 2+ on the left side.      Patellar reflexes are 2+ on the right side and 2+ on the left side.      Achilles reflexes are 2+ on the right side and 2+ on the left side. Speech is clear and goal oriented Moves extremities without ataxia  Skin: Skin is warm and dry. No rash noted. She is not diaphoretic. No erythema.  Psychiatric: She has a normal mood and affect. Her behavior is normal.  Nursing note and vitals reviewed.   ED Course  Procedures (including critical care time) Labs Review Labs Reviewed  URINALYSIS, ROUTINE W REFLEX MICROSCOPIC (NOT AT [facility]) - Abnormal; Notable for the following:     APPearance CLOUDY (*)    Protein, ur 30 (*)    All other components within normal limits  URINE MICROSCOPIC-ADD ON - Abnormal; Notable for the following:    Squamous Epithelial / LPF MANY (*)    Bacteria, UA MANY (*)    All other components within normal limits  CBC WITH DIFFERENTIAL/PLATELET - Abnormal; Notable for the following:    Hemoglobin 11.3 (*)    HCT 35.1 (*)    All other components within normal limits  COMPREHENSIVE METABOLIC PANEL - Abnormal; Notable for the following:    Glucose, Bld 105 (*)    All other components within normal limits  WET PREP, GENITAL  PREGNANCY, URINE  LIPASE, BLOOD  GC/CHLAMYDIA PROBE AMP ([place]) NOT AT [facility]    Imaging Review Ct Renal Stone Study  12/21/2014   CLINICAL DATA:  Generalized abdominal pain and left flank pain. Dysuria.  EXAM: CT ABDOMEN AND PELVIS WITHOUT CONTRAST  TECHNIQUE: Multidetector CT imaging of the abdomen and pelvis was performed following the standard protocol without IV contrast.  COMPARISON:  Mild dependent changes in the lung bases.  FINDINGS: Kidneys are symmetrical in size and shape. No hydronephrosis or hydroureter. No renal, ureteral, or bladder stones. No bladder wall thickening.  Mild diffuse fatty infiltration of the liver. Gallbladder, pancreas, spleen, adrenal glands, abdominal aorta, inferior vena cava, and retroperitoneal lymph nodes are unremarkable. There is mild infiltration in the inferior retroperitoneal fat which may indicate inflammatory changes. Stomach, small bowel, and colon are not abnormally distended. No free air or free fluid in the abdomen.  Pelvis: Nodular contour of the uterus suggesting fibroids. No pelvic mass or lymphadenopathy. No free or loculated pelvic fluid collections. No evidence of diverticulitis. Appendix is normal. Postoperative changes with posterior laminectomies and posterior fixation from L4 to the sacrum. Stimulator device with generator pack present in the sacral region. No  destructive bone lesions.  IMPRESSION: No renal or ureteral stone or obstruction. Mild diffuse fatty infiltration of the liver. Vague a infiltration in the inferior retroperitoneal fat may indicate inflammatory changes.   Electronically Signed   By: Lucienne Capers M.D.   On: 12/21/2014 18:51   I have personally reviewed and evaluated these images and lab results as part of my medical decision-making.   EKG Interpretation None      MDM   Final diagnoses:  Generalized abdominal pain  Left flank pain   Felicia Molina presents with left back pain and left abdominal pain which radiates into the  right side of her abdomen. Patient with dysuria but no nausea vomiting or diarrhea.  Labs are reassuring. UA is without evidence of urinary tract infection. Pregnancy test is negative. Labs are reassuring. No leukocytosis. Mild anemia noted.  Pain controlled here in the emergency department.    CT scan with no renal or ureteral stone.  No other acute or concerning findings. No free air or fluid in the abdomen.  Patient's spinal stimulator is noted.  There is no evidence of diverticulitis, pyelonephritis, renal or ureteral stones, appendicitis.  No specific cause of patient's abdominal pain is found. Her vitals remained stable. She's afebrile without tachycardia or hypotension. No sources of infection were found.  Her pain is well controlled and she wishes for discharge home.  Discussed findings and reasons to return immediately to the emergency department including high fevers, nausea, worsening pain, vomiting, bloody stools or other concerns.  BP 158/105 mmHg  Pulse 94  Temp(Src) 98.2 F (36.8 C) (Oral)  Resp 18  Ht 5' (1.524 m)  Wt 301 lb (136.533 kg)  BMI 58.79 kg/m2  SpO2 100%  LMP 11/16/2014  The patient was discussed with and seen by Dr. Gerald Leitz who agrees with the treatment plan.   Jarrett Soho Leibish Mcgregor, PA-C 12/22/14 0006  Courteney Julio Alm, MD 12/22/14 [phone]

## 2014-12-21 NOTE — ED Notes (Signed)
Due to patient size, ultrasound required to identify vein for lab draws, pt poorly tolerated lab drawn from Lt ac, only able to obtain 79mls of blood and immediately withdrew needle. Pt states left ac painful, warm blanket and elevation implemented to LUE, EDP informed.

## 2014-12-21 NOTE — ED Notes (Addendum)
Pt reports abd pain x 1 day "all over but worse on left"; last BM today normal per pt report; dysuria; denies n/v/d; Had check up with PCP on Friday and HTN meds increased

## 2014-12-21 NOTE — ED Notes (Signed)
Pt presents with abd pain , primarily on left side. Onset yesterday. Denies N/V/D

## 2014-12-23 LAB — GC/CHLAMYDIA PROBE AMP (~~LOC~~) NOT AT ARMC
CHLAMYDIA, DNA PROBE: NEGATIVE
Neisseria Gonorrhea: NEGATIVE

## 2015-04-04 ENCOUNTER — Ambulatory Visit: Payer: PRIVATE HEALTH INSURANCE | Admitting: Internal Medicine

## 2015-04-25 ENCOUNTER — Emergency Department (HOSPITAL_BASED_OUTPATIENT_CLINIC_OR_DEPARTMENT_OTHER): Payer: Medicaid Other

## 2015-04-25 ENCOUNTER — Emergency Department (HOSPITAL_BASED_OUTPATIENT_CLINIC_OR_DEPARTMENT_OTHER)
Admission: EM | Admit: 2015-04-25 | Discharge: 2015-04-26 | Disposition: A | Discharge status: Home / Self Care | Payer: Medicaid Other | Attending: Emergency Medicine | Admitting: Emergency Medicine

## 2015-04-25 ENCOUNTER — Encounter (HOSPITAL_BASED_OUTPATIENT_CLINIC_OR_DEPARTMENT_OTHER): Payer: Self-pay | Admitting: *Deleted

## 2015-04-25 DIAGNOSIS — M797 Fibromyalgia: Secondary | ICD-10-CM | POA: Diagnosis not present

## 2015-04-25 DIAGNOSIS — Z79899 Other long term (current) drug therapy: Secondary | ICD-10-CM | POA: Diagnosis not present

## 2015-04-25 DIAGNOSIS — B349 Viral infection, unspecified: Secondary | ICD-10-CM

## 2015-04-25 DIAGNOSIS — M545 Low back pain: Secondary | ICD-10-CM | POA: Diagnosis present

## 2015-04-25 DIAGNOSIS — G8929 Other chronic pain: Secondary | ICD-10-CM | POA: Diagnosis not present

## 2015-04-25 DIAGNOSIS — K219 Gastro-esophageal reflux disease without esophagitis: Secondary | ICD-10-CM | POA: Diagnosis not present

## 2015-04-25 DIAGNOSIS — R0789 Other chest pain: Secondary | ICD-10-CM | POA: Diagnosis not present

## 2015-04-25 DIAGNOSIS — Z87891 Personal history of nicotine dependence: Secondary | ICD-10-CM | POA: Insufficient documentation

## 2015-04-25 DIAGNOSIS — F410 Panic disorder [episodic paroxysmal anxiety] without agoraphobia: Secondary | ICD-10-CM | POA: Insufficient documentation

## 2015-04-25 DIAGNOSIS — I10 Essential (primary) hypertension: Secondary | ICD-10-CM | POA: Diagnosis not present

## 2015-04-25 DIAGNOSIS — J45909 Unspecified asthma, uncomplicated: Secondary | ICD-10-CM | POA: Insufficient documentation

## 2015-04-25 DIAGNOSIS — Z7951 Long term (current) use of inhaled steroids: Secondary | ICD-10-CM | POA: Diagnosis not present

## 2015-04-25 DIAGNOSIS — Z87448 Personal history of other diseases of urinary system: Secondary | ICD-10-CM | POA: Diagnosis not present

## 2015-04-25 DIAGNOSIS — M549 Dorsalgia, unspecified: Secondary | ICD-10-CM

## 2015-04-25 LAB — URINALYSIS, ROUTINE W REFLEX MICROSCOPIC
Bilirubin Urine: NEGATIVE
Glucose, UA: NEGATIVE mg/dL
Hgb urine dipstick: NEGATIVE
Ketones, ur: NEGATIVE mg/dL
LEUKOCYTES UA: NEGATIVE
Nitrite: NEGATIVE
Protein, ur: NEGATIVE mg/dL
Specific Gravity, Urine: 1.013 (ref 1.005–1.030)
pH: 5.5 (ref 5.0–8.0)

## 2015-04-25 MED ORDER — IPRATROPIUM-ALBUTEROL 0.5-2.5 (3) MG/3ML IN SOLN
3.0000 mL | RESPIRATORY_TRACT | Status: DC
  Administered 2015-04-25: 3 mL via RESPIRATORY_TRACT
  Filled 2015-04-25: qty 3

## 2015-04-25 MED ORDER — SODIUM CHLORIDE 0.9 % IV BOLUS (SEPSIS): 1000.0000 mL | Freq: Once | INTRAVENOUS | Status: DC

## 2015-04-25 NOTE — ED Notes (Signed)
back from xray.

## 2015-04-25 NOTE — ED Notes (Signed)
Lower back pain for a week. Cough, diarrhea, vomiting and pain over her left chest.

## 2015-04-25 NOTE — ED Notes (Addendum)
Dr. Florina Ou and this RN at [facility], pt alert, NAD, calm, interactive, resps e/u, speaking in clear choppy sentences d/t pain, states, "I'm on pain management, I don't need pain meds, I took a percocet PTA", c/o vd x1 week, low equal back pain, productive cough (brown/green/yellow), L chest pain (worse with palpation), mentions 'fell last night, knee gave out", (denies: fever), pt ambulatory to b/r for urine sample.

## 2015-04-25 NOTE — ED Provider Notes (Addendum)
CSN: [id number]     Arrival date & time 04/25/15  2224 History   First MD Initiated Contact with Patient 04/25/15 2313     Chief Complaint  Patient presents with  . Back Pain     (Consider location/radiation/quality/duration/timing/severity/associated sxs/prior Treatment) HPI  This is a 44 year old female with a history of chronic back pain on Opana ER and Percocet. She just got her prescriptions filled 4 days ago. She is here with a one-week history of "a cold". Specifically she's had cough that has become productive of brown sputum, left chest wall pain worse with coughing or deep breaths, vomiting and diarrhea. She denies nausea at the present time, fever or shortness of breath. She is complaining of an exacerbation of her low back pain but denies injury triggering this.  Past Medical History  Diagnosis Date  . Asthma   . Hypertension   . Fibromyalgia   . Neurogenic bladder   . Back pain, chronic   . Panic attacks   . Reflux   . Morbid obesity [facility])    Past Surgical History  Procedure Laterality Date  . Back surgery    . Carpal tunnel release    . Ureteral stent placement  2008  . Bladder stimulator     No family history on file. Social History  Substance Use Topics  . Smoking status: Former Research scientist (life sciences)  . Smokeless tobacco: Never Used     Comment: quit 2004  . Alcohol Use: No   OB History    Gravida Para Term Preterm AB TAB SAB Ectopic Multiple Living   6 4 4  0 2 0 2 0 0 4     Review of Systems  All other systems reviewed and are negative.   Allergies  Clindamycin/lincomycin; Ibuprofen; and Tomato  Home Medications   Prior to Admission medications   Medication Sig Start Date End Date Taking? Authorizing Provider  albuterol (PROVENTIL HFA;VENTOLIN HFA) 108 (90 BASE) MCG/ACT inhaler Inhale 2 puffs into the lungs every 6 (six) hours as needed. Takes for shortness of breath    Historical Provider, MD  ALPRAZolam Duanne Moron) 0.5 MG tablet Take 0.5 mg by mouth at bedtime  as needed. Takes for depression    Historical Provider, MD  budesonide-formoterol (SYMBICORT) 160-4.5 MCG/ACT inhaler Inhale 2 puffs into the lungs 2 (two) times daily.    Historical Provider, MD  cyclobenzaprine (FLEXERIL) 10 MG tablet Take 10 mg by mouth 3 (three) times daily as needed. Takes for muscle spasms    Historical Provider, MD  diclofenac sodium (VOLTAREN) 1 % GEL Apply 2 g topically 4 (four) times daily.    Historical Provider, MD  furosemide (LASIX) 40 MG tablet Take 1 tablet (40 mg total) by mouth daily. 05/09/12   Verlee Monte, MD  hydrALAZINE (APRESOLINE) 25 MG tablet Take 100 mg by mouth daily.    Historical Provider, MD  ipratropium (ATROVENT) 0.06 % nasal spray Place 2 sprays into the nose 4 (four) times daily. 03/04/13   Billy Fischer, MD  LISINOPRIL PO Take 40 mg by mouth daily.     Historical Provider, MD  montelukast (SINGULAIR) 10 MG tablet Take 10 mg by mouth at bedtime.    Historical Provider, MD  OMEPRAZOLE PO Take 1 tablet by mouth daily.    Historical Provider, MD  oxyCODONE-acetaminophen (PERCOCET) 5-325 MG per tablet Take 1 tablet by mouth every 4 (four) hours as needed for moderate pain. [id number]   [name], MD  oxymorphone (OPANA ER) 20 MG 12  hr tablet Take 20 mg by mouth every 12 (twelve) hours.    Historical Provider, MD  pregabalin (LYRICA) 75 MG capsule Take 75 mg by mouth 3 (three) times daily.    Historical Provider, MD   BP 120/77 mmHg  Pulse 110  Temp(Src) 97.6 F (36.4 C) (Oral)  Resp 18  Ht 5' (1.524 m)  Wt 282 lb (127.914 kg)  BMI 55.07 kg/m2  SpO2 97%   Physical Exam  General: Well-developed, obese female in no acute distress; appearance consistent with age of record HENT: normocephalic; atraumatic Eyes: pupils equal, round and reactive to light; extraocular muscles intact Neck: supple Heart: regular rate and rhythm; tachycardia Lungs: Distant sounds Chest: Left upper chest wall tenderness Abdomen: soft; nondistended; nontender; bowel  sounds present Back: Lumbar tenderness Extremities: No deformity; full range of motion Neurologic: Awake, alert and oriented; motor function intact in all extremities and symmetric; no facial droop Skin: Warm and dry Psychiatric: Normal mood and affect   ED Course  Procedures (including critical care time)   EKG Interpretation   Date/Time:  Friday April 25 2015 22:52:23 EST Ventricular Rate:  104 PR Interval:  130 QRS Duration: 77 QT Interval:  404 QTC Calculation: 531 R Axis:   14 Text Interpretation:  Sinus tachycardia Low voltage, precordial leads  Prolonged QT interval Rate is faster, QT prolonged Confirmed by Florina Ou   MD, Jenny Reichmann ([id number]) on 04/25/2015 10:55:25 PM      MDM  Nursing notes and vitals signs, including pulse oximetry, reviewed.  Summary of this visit's results, reviewed by myself:  Labs:  Results for orders placed or performed during the hospital encounter of 04/25/15 (from the past 24 hour(s))  Urinalysis, Routine w reflex microscopic (not at [facility])     Status: Abnormal   Collection Time: 04/25/15 11:26 PM  Result Value Ref Range   Color, Urine YELLOW YELLOW   APPearance CLOUDY (A) CLEAR   Specific Gravity, Urine 1.013 1.005 - 1.030   pH 5.5 5.0 - 8.0   Glucose, UA NEGATIVE NEGATIVE mg/dL   Hgb urine dipstick NEGATIVE NEGATIVE   Bilirubin Urine NEGATIVE NEGATIVE   Ketones, ur NEGATIVE NEGATIVE mg/dL   Protein, ur NEGATIVE NEGATIVE mg/dL   Nitrite NEGATIVE NEGATIVE   Leukocytes, UA NEGATIVE NEGATIVE    Imaging Studies: Dg Chest 2 View  04/26/2015  CLINICAL DATA:  Acute onset of left-sided chest pain and lower back pain. Cough, diarrhea and vomiting. Initial encounter. EXAM: CHEST  2 VIEW COMPARISON:  Chest radiograph and CTA of the chest performed 05/25/2014 FINDINGS: The lungs are well-aerated and clear. There is no evidence of focal opacification, pleural effusion or pneumothorax. The heart is normal in size; the mediastinal contour is within  normal limits. No acute osseous abnormalities are seen. IMPRESSION: No acute cardiopulmonary process seen. Electronically Signed   By: Garald Balding M.D.   On: 04/26/2015 00:20   12:30 AM Air movement improved after neb treatment. Patient understands that she is already on chronic pain management and additional narcotics are not indicated at this time. Nursing staff was unable to secure an IV. She is not presently nauseated so was encouraged to drink fluids to rehydrate herself.    Shanon Rosser, MD 04/26/15 [id number]  Shanon Rosser, MD 04/26/15 [phone]

## 2015-04-25 NOTE — ED Notes (Signed)
Pt in xray

## 2015-04-26 MED ORDER — ALBUTEROL SULFATE HFA 108 (90 BASE) MCG/ACT IN AERS
2.0000 | INHALATION_SPRAY | RESPIRATORY_TRACT | Status: DC | PRN
  Administered 2015-04-26: 2 via RESPIRATORY_TRACT
  Filled 2015-04-26: qty 6.7

## 2015-04-26 NOTE — ED Notes (Signed)
Attempted IV x4 (total) at this point in time, 2 by [place]. EDP aware at [facility]. No changes, VSS. Remains alert, NAD, calm, interactive, no dyspnea noted.

## 2015-04-26 NOTE — ED Notes (Signed)
No changes, attempted IV x2 w/o success.

## 2015-05-08 ENCOUNTER — Ambulatory Visit: Payer: PRIVATE HEALTH INSURANCE | Admitting: Internal Medicine

## 2015-05-13 DIAGNOSIS — M545 Low back pain, unspecified: Secondary | ICD-10-CM | POA: Insufficient documentation

## 2015-05-13 DIAGNOSIS — G8929 Other chronic pain: Secondary | ICD-10-CM | POA: Insufficient documentation

## 2015-05-15 ENCOUNTER — Ambulatory Visit: Payer: PRIVATE HEALTH INSURANCE | Admitting: Internal Medicine

## 2015-05-29 ENCOUNTER — Ambulatory Visit: Payer: PRIVATE HEALTH INSURANCE | Admitting: Internal Medicine

## 2015-06-18 ENCOUNTER — Telehealth: Payer: Self-pay | Admitting: Internal Medicine

## 2015-06-18 NOTE — Telephone Encounter (Signed)
Pt calling to schedule follow up appt with MW per Dr. Emilee Hero- Bonsu for uncontrolled asthma Pt has cancelled 8 appts since December. MW is it still okay to schedule appt with you? thanks

## 2015-06-18 NOTE — Telephone Encounter (Signed)
LMOMTCB x 1 

## 2015-06-18 NOTE — Telephone Encounter (Signed)
Last chance but only add her on at the very end of the day and double book that ov (so we are not giving up someone else's slot) and if is a no show then will not be able to offer any future slot

## 2015-06-19 ENCOUNTER — Ambulatory Visit: Payer: PRIVATE HEALTH INSURANCE | Admitting: Internal Medicine

## 2015-06-19 NOTE — Telephone Encounter (Signed)
Spoke with pt. She has been added on to MW's schedule on 06/23/15 at 3:30pm. Nothing further was needed at this time.

## 2015-06-20 ENCOUNTER — Ambulatory Visit: Payer: PRIVATE HEALTH INSURANCE | Admitting: Internal Medicine

## 2015-06-23 ENCOUNTER — Ambulatory Visit: Payer: PRIVATE HEALTH INSURANCE | Admitting: Internal Medicine

## 2015-09-14 IMAGING — CR DG CHEST 2V
2 series · 2 of 2 positions shown · non-contrast
Comparison: 05/04/2014

CLINICAL DATA: Upper left-sided chest pain radiating down the left
arm. Shortness of breath.

EXAM:
CHEST  2 VIEW

[chest pa]
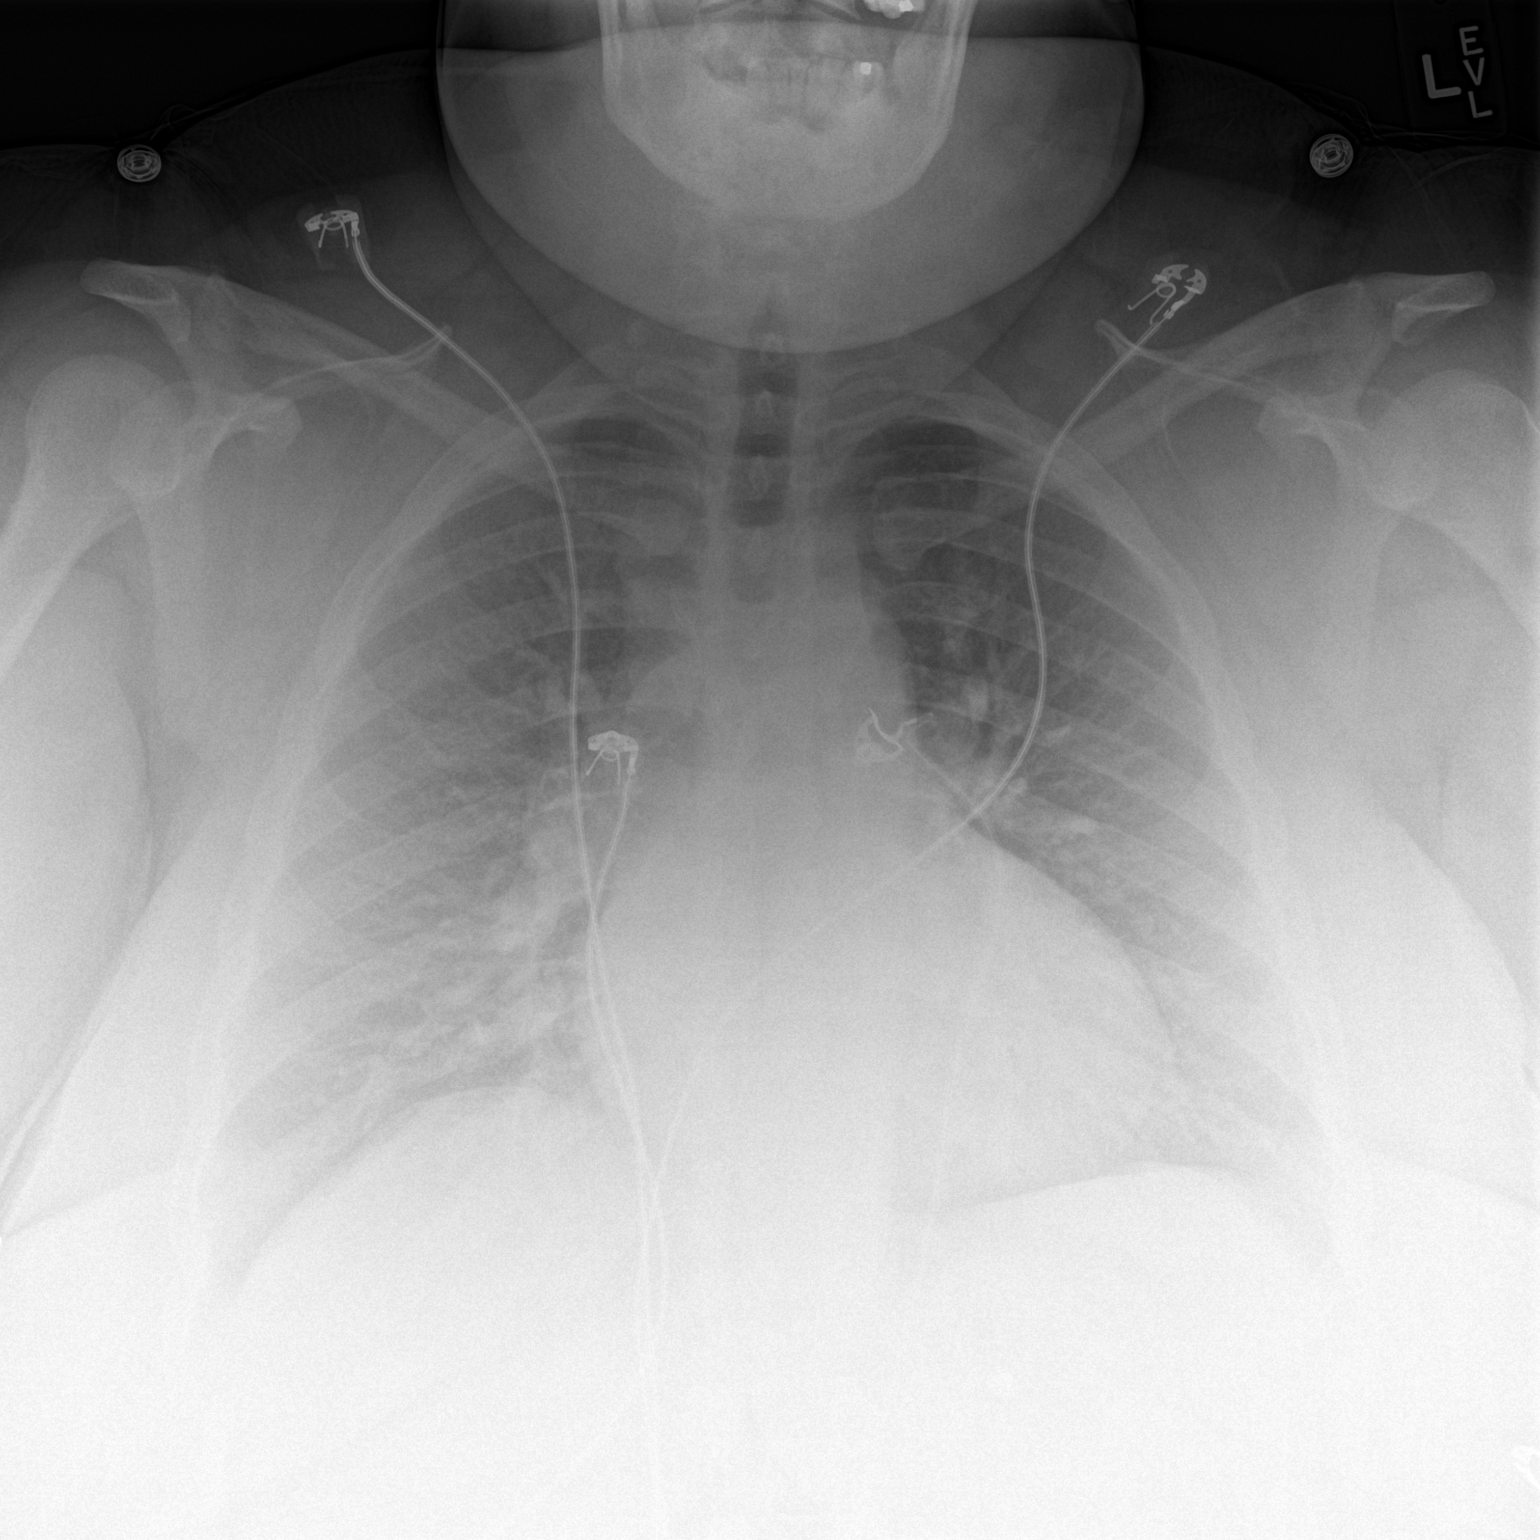

[chest lat]
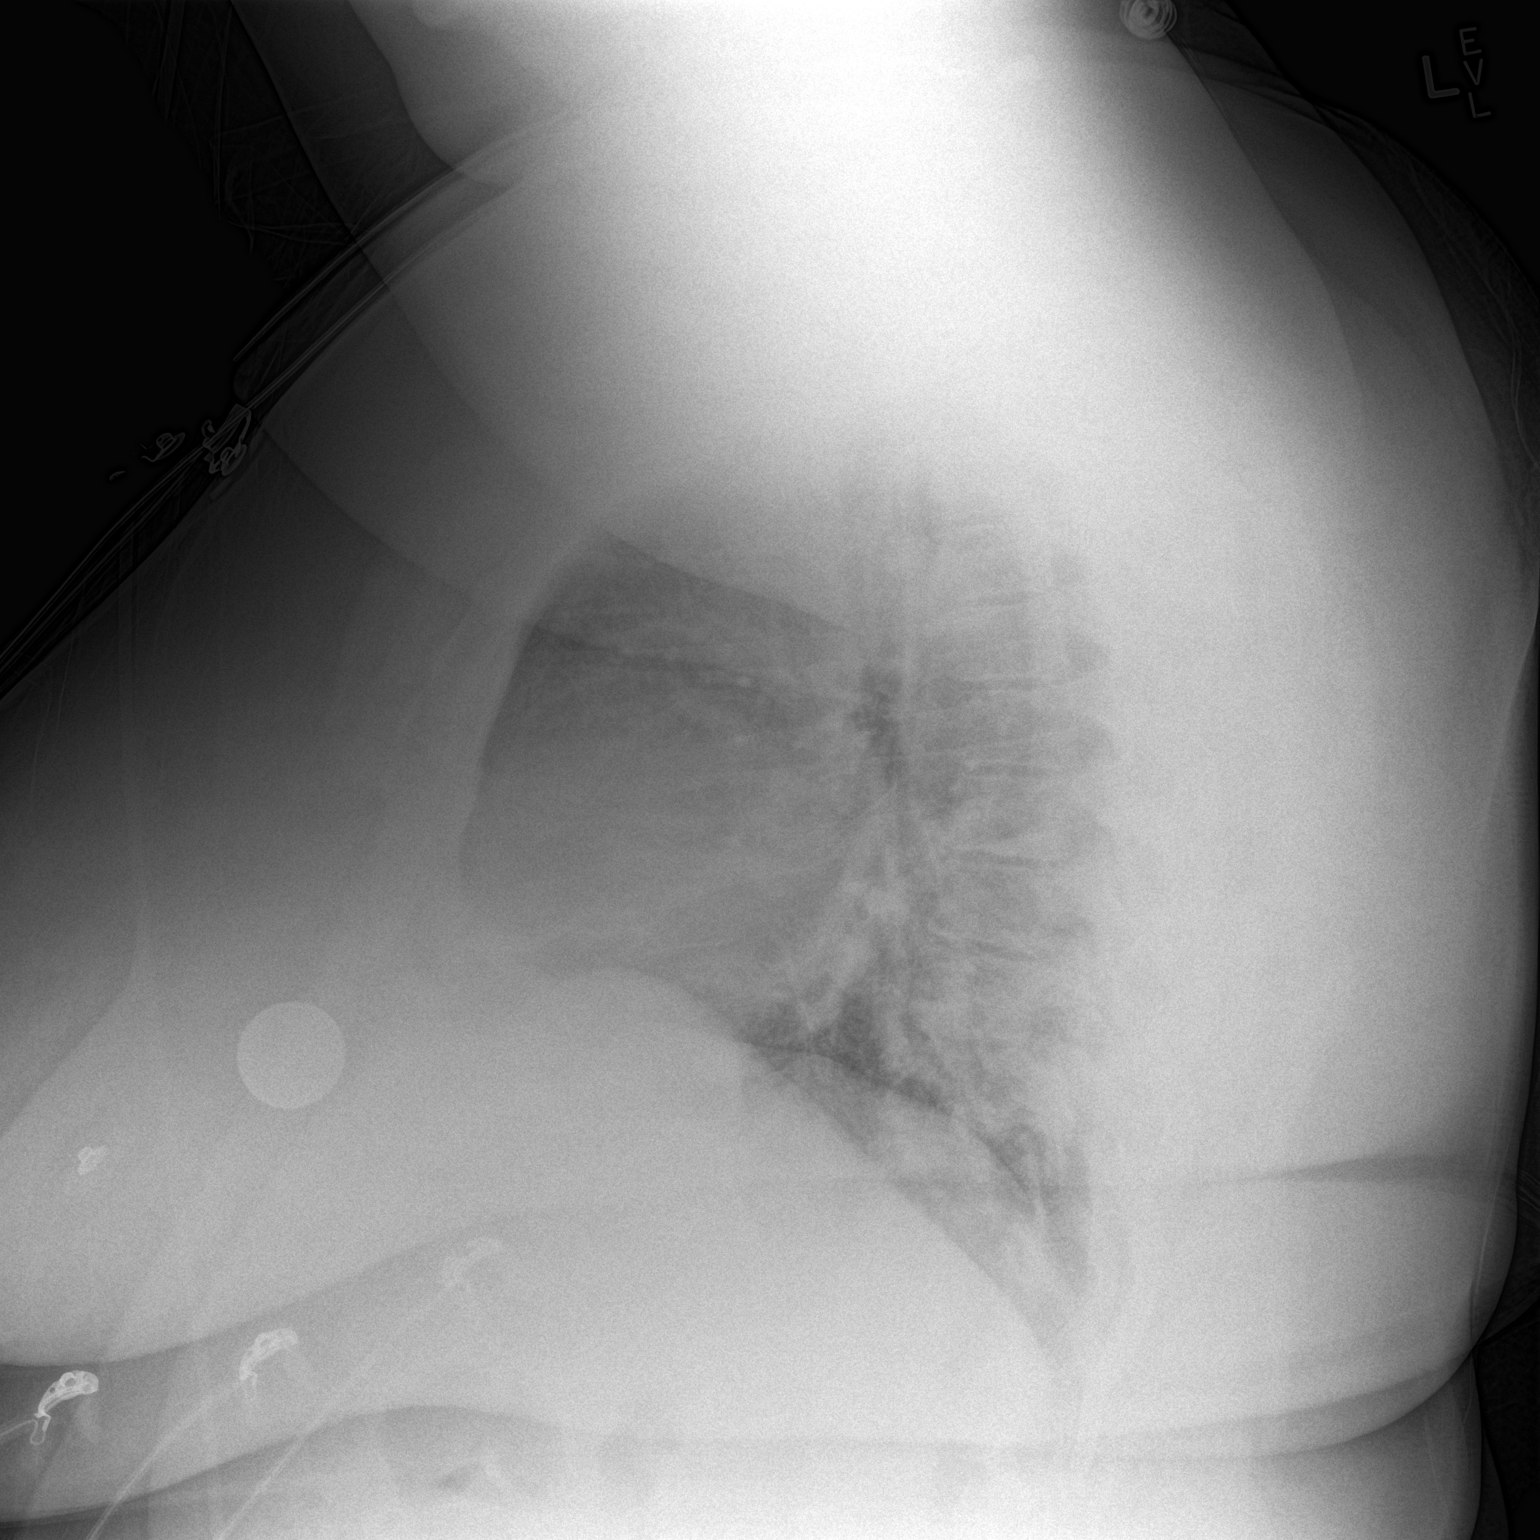

[2 of 2 positions shown; findings below may reference images not displayed]

FINDINGS: The heart size and mediastinal contours are within normal limits.
Both lungs are clear. The visualized skeletal structures are
unremarkable.
IMPRESSION: No active cardiopulmonary disease.

## 2015-09-20 ENCOUNTER — Encounter (HOSPITAL_BASED_OUTPATIENT_CLINIC_OR_DEPARTMENT_OTHER): Payer: Self-pay | Admitting: Emergency Medicine

## 2015-09-20 ENCOUNTER — Emergency Department (HOSPITAL_BASED_OUTPATIENT_CLINIC_OR_DEPARTMENT_OTHER): Payer: Medicaid Other

## 2015-09-20 ENCOUNTER — Emergency Department (HOSPITAL_BASED_OUTPATIENT_CLINIC_OR_DEPARTMENT_OTHER)
Admission: EM | Admit: 2015-09-20 | Discharge: 2015-09-20 | Disposition: A | Discharge status: Home / Self Care | Payer: Medicaid Other | Attending: Emergency Medicine | Admitting: Emergency Medicine

## 2015-09-20 DIAGNOSIS — Z87891 Personal history of nicotine dependence: Secondary | ICD-10-CM | POA: Diagnosis not present

## 2015-09-20 DIAGNOSIS — H9209 Otalgia, unspecified ear: Secondary | ICD-10-CM | POA: Insufficient documentation

## 2015-09-20 DIAGNOSIS — I10 Essential (primary) hypertension: Secondary | ICD-10-CM | POA: Diagnosis not present

## 2015-09-20 DIAGNOSIS — Z6841 Body Mass Index (BMI) 40.0 and over, adult: Secondary | ICD-10-CM | POA: Insufficient documentation

## 2015-09-20 DIAGNOSIS — J45909 Unspecified asthma, uncomplicated: Secondary | ICD-10-CM | POA: Diagnosis not present

## 2015-09-20 DIAGNOSIS — M549 Dorsalgia, unspecified: Secondary | ICD-10-CM | POA: Diagnosis not present

## 2015-09-20 DIAGNOSIS — J069 Acute upper respiratory infection, unspecified: Secondary | ICD-10-CM

## 2015-09-20 DIAGNOSIS — R0602 Shortness of breath: Secondary | ICD-10-CM | POA: Diagnosis present

## 2015-09-20 MED ORDER — IPRATROPIUM-ALBUTEROL 0.5-2.5 (3) MG/3ML IN SOLN
3.0000 mL | Freq: Once | RESPIRATORY_TRACT | Status: AC
  Administered 2015-09-20: 3 mL via RESPIRATORY_TRACT
  Filled 2015-09-20: qty 3

## 2015-09-20 MED ORDER — GUAIFENESIN-DM 100-10 MG/5ML PO SYRP: 5.0000 mL | ORAL_SOLUTION | ORAL | Status: DC | PRN

## 2015-09-20 MED ORDER — PREDNISONE 10 MG (21) PO TBPK: 10.0000 mg | ORAL_TABLET | Freq: Every day | ORAL | Status: DC

## 2015-09-20 MED ORDER — PREDNISONE 50 MG PO TABS
60.0000 mg | ORAL_TABLET | Freq: Once | ORAL | Status: AC
  Administered 2015-09-20: 60 mg via ORAL
  Filled 2015-09-20: qty 1

## 2015-09-20 NOTE — ED Provider Notes (Signed)
CSN: [id number]     Arrival date & time 09/20/15  1245 History   First MD Initiated Contact with Patient 09/20/15 1410     Chief Complaint  Patient presents with  . Cough  . Shortness of Breath     (Consider location/radiation/quality/duration/timing/severity/associated sxs/prior Treatment) The history is provided by the patient and medical records.     Pt with hx asthma, HTN, morbid obesity, chronic back pain p/w 2 days of cough productive of brown and white sputum, SOB, right back pain, nasal congestion, sinus pressure, ear pain.  Has been taking all of her home medications.  Denies recent immobilization, exogenous estrogen, hx blood clot, unilateral leg swelling.  Chronic unchanged lower extremity edema > 1 year - PCP has changed lasix from 40mg  to 60mg  but she has not started taking it that way.    PCP Dr Vista Lawman.      Past Medical History  Diagnosis Date  . Asthma   . Hypertension   . Fibromyalgia   . Neurogenic bladder   . Back pain, chronic   . Panic attacks   . Reflux   . Morbid obesity [facility])    Past Surgical History  Procedure Laterality Date  . Back surgery    . Carpal tunnel release    . Ureteral stent placement  2008  . Bladder stimulator     No family history on file. Social History  Substance Use Topics  . Smoking status: Former Research scientist (life sciences)  . Smokeless tobacco: Never Used     Comment: quit 2004  . Alcohol Use: No   OB History    Gravida Para Term Preterm AB TAB SAB Ectopic Multiple Living   6 4 4  0 2 0 2 0 0 4     Review of Systems  All other systems reviewed and are negative.     Allergies  Clindamycin/lincomycin; Ibuprofen; and Tomato  Home Medications   Prior to Admission medications   Medication Sig Start Date End Date Taking? Authorizing Provider  albuterol (PROVENTIL HFA;VENTOLIN HFA) 108 (90 BASE) MCG/ACT inhaler Inhale 2 puffs into the lungs every 6 (six) hours as needed. Takes for shortness of breath    Historical Provider, MD   ALPRAZolam Duanne Moron) 0.5 MG tablet Take 0.5 mg by mouth at bedtime as needed. Takes for depression    Historical Provider, MD  budesonide-formoterol (SYMBICORT) 160-4.5 MCG/ACT inhaler Inhale 2 puffs into the lungs 2 (two) times daily.    Historical Provider, MD  cyclobenzaprine (FLEXERIL) 10 MG tablet Take 10 mg by mouth 3 (three) times daily as needed. Takes for muscle spasms    Historical Provider, MD  diclofenac sodium (VOLTAREN) 1 % GEL Apply 2 g topically 4 (four) times daily.    Historical Provider, MD  furosemide (LASIX) 40 MG tablet Take 1 tablet (40 mg total) by mouth daily. 05/09/12   Verlee Monte, MD  hydrALAZINE (APRESOLINE) 25 MG tablet Take 100 mg by mouth daily.    Historical Provider, MD  ipratropium (ATROVENT) 0.06 % nasal spray Place 2 sprays into the nose 4 (four) times daily. 03/04/13   Billy Fischer, MD  LISINOPRIL PO Take 40 mg by mouth daily.     Historical Provider, MD  montelukast (SINGULAIR) 10 MG tablet Take 10 mg by mouth at bedtime.    Historical Provider, MD  OMEPRAZOLE PO Take 1 tablet by mouth daily.    Historical Provider, MD  oxyCODONE-acetaminophen (PERCOCET) 5-325 MG per tablet Take 1 tablet by mouth every 4 (four)  hours as needed for moderate pain. [id number]   [name], MD  oxymorphone (OPANA ER) 20 MG 12 hr tablet Take 20 mg by mouth every 12 (twelve) hours.    Historical Provider, MD  pregabalin (LYRICA) 75 MG capsule Take 75 mg by mouth 3 (three) times daily.    Historical Provider, MD   BP 154/89 mmHg  Pulse 100  Temp(Src) 98.1 F (36.7 C) (Oral)  Resp 20  Ht 5' (1.524 m)  Wt 136.079 kg  BMI 58.59 kg/m2  SpO2 95% Physical Exam  Constitutional: She appears well-developed and well-nourished. No distress.  Morbidly obese   HENT:  Head: Normocephalic and atraumatic.  Nose: Rhinorrhea present. Right sinus exhibits maxillary sinus tenderness and frontal sinus tenderness. Left sinus exhibits maxillary sinus tenderness and frontal sinus tenderness.   Mouth/Throat: Uvula is midline and oropharynx is clear and moist. Mucous membranes are not dry. No oropharyngeal exudate, posterior oropharyngeal edema, posterior oropharyngeal erythema or tonsillar abscesses.  Eyes: Conjunctivae are normal.  Neck: Neck supple.  Cardiovascular: Normal rate, regular rhythm and intact distal pulses.   Pulmonary/Chest: Effort normal and breath sounds normal. Tachypnea noted. No respiratory distress. She has no decreased breath sounds. She has no wheezes. She has no rhonchi. She has no rales.  Musculoskeletal: She exhibits edema (pitting edema bilateral lower extremities, equivalent in size, nontender ).  Neurological: She is alert.  Skin: She is not diaphoretic.  Nursing note and vitals reviewed.   ED Course  Procedures (including critical care time) Labs Review Labs Reviewed - No data to display  Imaging Review Dg Chest 2 View  09/20/2015  CLINICAL DATA:  Patient with shortness of breath and right lower chest pain. Cough for 2 days. EXAM: CHEST  2 VIEW COMPARISON:  Chest radiograph 04/25/2015. FINDINGS: Stable cardiac and mediastinal contours. No consolidative pulmonary opacities. No pleural effusion or pneumothorax. Mid thoracic spine degenerative changes. IMPRESSION: No active cardiopulmonary disease. Electronically Signed   By: Lovey Newcomer M.D.   On: 09/20/2015 13:42   I have personally reviewed and evaluated these images and lab results as part of my medical decision-making.   EKG Interpretation None      3:33 PM Pt reports improvement with neb treatment.  MDM   Final diagnoses:  URI (upper respiratory infection)  Asthma, unspecified asthma severity, uncomplicated    Afebrile, nontoxic patient with constellation of symptoms suggestive of viral syndrome.  No concerning findings on exam.  Pt has morbid obesity and asthma.  She does have peripheral edema but this is chronic and unchanged, doubt heart failure as etiology for SOB. No chest pain.   Doubt PE, ACS.  Pt feeling better after nebs, requested d/c home after second neb.  Prednisone also given.  Pt is supposed to increase lasix at home, which will likely address her peripheral edema.  Discharged home with supportive care, PCP follow up.  Discussed result, findings, treatment, and follow up  with patient.  Pt given return precautions.  Pt verbalizes understanding and agrees with plan.        Clayton Bibles, PA-C 09/20/15 [place], MD 09/21/15 [phone]

## 2015-09-20 NOTE — ED Notes (Signed)
Pt states cough and right side lower lateral chest wall and side pain with SOB for 2 days.  In NAD at this time.

## 2015-09-20 NOTE — Discharge Instructions (Signed)
Read the information below.  Use the prescribed medication as directed.  Please discuss all new medications with your pharmacist.  You may return to the Emergency Department at any time for worsening condition or any new symptoms that concern you.   If you develop high fevers that do not resolve with tylenol or ibuprofen, you have difficulty swallowing or breathing, or you are unable to tolerate fluids by mouth, return to the ER for a recheck.      Viral Infections A virus is a type of germ. Viruses can cause:  Minor sore throats.  Aches and pains.  Headaches.  Runny nose.  Rashes.  Watery eyes.  Tiredness.  Coughs.  Loss of appetite.  Feeling sick to your stomach (nausea).  Throwing up (vomiting).  Watery poop (diarrhea). HOME CARE   Only take medicines as told by your doctor.  Drink enough water and fluids to keep your pee (urine) clear or pale yellow. Sports drinks are a good choice.  Get plenty of rest and eat healthy. Soups and broths with crackers or rice are fine. GET HELP RIGHT AWAY IF:   You have a very bad headache.  You have shortness of breath.  You have chest pain or neck pain.  You have an unusual rash.  You cannot stop throwing up.  You have watery poop that does not stop.  You cannot keep fluids down.  You or your child has a temperature by mouth above 102 F (38.9 C), not controlled by medicine.  Your baby is older than 3 months with a rectal temperature of 102 F (38.9 C) or higher.  Your baby is 94 months old or younger with a rectal temperature of 100.4 F (38 C) or higher. MAKE SURE YOU:   Understand these instructions.  Will watch this condition.  Will get help right away if you are not doing well or get worse.   This information is not intended to replace advice given to you by your health care provider. Make sure you discuss any questions you have with your health care provider.   Document Released: 03/25/2008 Document  Revised: 07/05/2011 Document Reviewed: 09/18/2014 Elsevier Interactive Patient Education [employer].

## 2015-12-12 ENCOUNTER — Encounter (HOSPITAL_COMMUNITY): Payer: Self-pay | Admitting: Emergency Medicine

## 2015-12-12 ENCOUNTER — Emergency Department (HOSPITAL_COMMUNITY)
Admission: EM | Admit: 2015-12-12 | Discharge: 2015-12-12 | Disposition: A | Discharge status: Home / Self Care | Payer: Medicaid Other | Attending: Emergency Medicine | Admitting: Emergency Medicine

## 2015-12-12 ENCOUNTER — Emergency Department (HOSPITAL_COMMUNITY): Payer: Medicaid Other

## 2015-12-12 DIAGNOSIS — Z87891 Personal history of nicotine dependence: Secondary | ICD-10-CM | POA: Diagnosis not present

## 2015-12-12 DIAGNOSIS — I10 Essential (primary) hypertension: Secondary | ICD-10-CM | POA: Diagnosis not present

## 2015-12-12 DIAGNOSIS — R0781 Pleurodynia: Secondary | ICD-10-CM

## 2015-12-12 DIAGNOSIS — J45909 Unspecified asthma, uncomplicated: Secondary | ICD-10-CM | POA: Insufficient documentation

## 2015-12-12 DIAGNOSIS — R0789 Other chest pain: Secondary | ICD-10-CM

## 2015-12-12 DIAGNOSIS — R079 Chest pain, unspecified: Secondary | ICD-10-CM

## 2015-12-12 LAB — I-STAT TROPONIN, ED: TROPONIN I, POC: 0 ng/mL (ref 0.00–0.08)

## 2015-12-12 LAB — CBC
HCT: 36.6 % (ref 36.0–46.0)
Hemoglobin: 11.4 g/dL — ABNORMAL LOW (ref 12.0–15.0)
MCH: 27.9 pg (ref 26.0–34.0)
MCHC: 31.1 g/dL (ref 30.0–36.0)
MCV: 89.7 fL (ref 78.0–100.0)
PLATELETS: 391 10*3/uL (ref 150–400)
RBC: 4.08 MIL/uL (ref 3.87–5.11)
RDW: 14.9 % (ref 11.5–15.5)
WBC: 9.9 10*3/uL (ref 4.0–10.5)

## 2015-12-12 LAB — BASIC METABOLIC PANEL
Anion gap: 7 (ref 5–15)
BUN: 11 mg/dL (ref 6–20)
CALCIUM: 9.5 mg/dL (ref 8.9–10.3)
CO2: 27 mmol/L (ref 22–32)
CREATININE: 1 mg/dL (ref 0.44–1.00)
Chloride: 105 mmol/L (ref 101–111)
Glucose, Bld: 101 mg/dL — ABNORMAL HIGH (ref 65–99)
Potassium: 4 mmol/L (ref 3.5–5.1)
SODIUM: 139 mmol/L (ref 135–145)

## 2015-12-12 LAB — LIPASE, BLOOD: LIPASE: 23 U/L (ref 11–51)

## 2015-12-12 MED ORDER — DEXAMETHASONE SODIUM PHOSPHATE 10 MG/ML IJ SOLN
10.0000 mg | Freq: Once | INTRAMUSCULAR | Status: AC
  Administered 2015-12-12: 10 mg via INTRAMUSCULAR
  Filled 2015-12-12: qty 1

## 2015-12-12 NOTE — Discharge Instructions (Signed)
Follow up with your primary care provider on Monday if your chest/rib pain does not improve. Your primary care provider may refer to you to a cardiologist.   Return to the emergency department if you experience worsening pain associated with pain radiating into your jaw, left arm or back, sweating, nausea or vomiting, dizziness or you pass out or any other concerning symptoms.

## 2015-12-12 NOTE — ED Notes (Signed)
Pt d/c home with significant other via w/c

## 2015-12-12 NOTE — ED Triage Notes (Signed)
Cp and left sided abd  Pain started x 2 days thought it was GERD. Took a Zantac but it did no go away

## 2015-12-12 NOTE — ED Provider Notes (Signed)
[facility] DEPT Provider Note   CSN: [id number] Arrival date & time: 12/12/15  1301     History   Chief Complaint Chief Complaint  Patient presents with  . Chest Pain  . Abdominal Pain    HPI Felicia Molina is a 45 y.o. female.  HPI   Patient is a 45 year old female with a history of chronic back pain, fibromyalgia, asthma, morbid obesity who presents to the emergency department with left-sided rib and left chest wall pain for 2 days. Patient states she woke up with this pain. Pain is constant, sharp, 10/10, nothing makes it better. Twisting, laughing, deep breaths make the pain worse. Patient tried her home Opana and Percocet without relief. Patient was at her PCP this morning to be evaluated for these symptoms when she was found with a blood pressure of 90/60 and a heart rate of 120 with associated sweating and lightheadedness. Her PCP recommended she come to the ED. Patient has not had additional episodes of sweating. Patient denies headache, dizziness, syncope, shortness of breath, recent travel, recent surgery, hormone use, swelling or pain in her calves, abdominal pain, nausea, vomiting, diarrhea, hematuria, dysuria.  Past Medical History:  Diagnosis Date  . Asthma   . Back pain, chronic   . Fibromyalgia   . Hypertension   . Morbid obesity ([place])   . Neurogenic bladder   . Panic attacks   . Reflux     Patient Active Problem List   Diagnosis Date Noted  . Dyspnea 06/04/2014  . Plantar fasciitis, bilateral 07/19/2012  . Metatarsalgia of both feet 07/19/2012  . Acute respiratory failure ([place]) 05/07/2012  . Hypertension   . CAP (community acquired pneumonia) 05/06/2012  . Influenza-like illness 05/06/2012    Past Surgical History:  Procedure Laterality Date  . BACK SURGERY    . bladder stimulator    . CARPAL TUNNEL RELEASE    . URETERAL STENT PLACEMENT  2008    OB History    Gravida Para Term Preterm AB Living   6 4 4  0 2 4   SAB TAB Ectopic Multiple Live  Births   2 0 0 0 4       Home Medications    Prior to Admission medications   Medication Sig Start Date End Date Taking? Authorizing Provider  albuterol (PROVENTIL HFA;VENTOLIN HFA) 108 (90 BASE) MCG/ACT inhaler Inhale 2 puffs into the lungs every 6 (six) hours as needed. Takes for shortness of breath   Yes Historical Provider, MD  ALPRAZolam (XANAX) 0.5 MG tablet Take 0.5 mg by mouth at bedtime as needed. Takes for depression   Yes Historical Provider, MD  amLODipine (NORVASC) 10 MG tablet Take 15 mg by mouth daily.   Yes Historical Provider, MD  atenolol (TENORMIN) 25 MG tablet Take 25 mg by mouth daily.   Yes Historical Provider, MD  budesonide-formoterol (SYMBICORT) 160-4.5 MCG/ACT inhaler Inhale 2 puffs into the lungs 2 (two) times daily.   Yes Historical Provider, MD  cyclobenzaprine (FLEXERIL) 10 MG tablet Take 10 mg by mouth 3 (three) times daily as needed. Takes for muscle spasms   Yes Historical Provider, MD  diclofenac sodium (VOLTAREN) 1 % GEL Apply 2 g topically 4 (four) times daily.   Yes Historical Provider, MD  furosemide (LASIX) 40 MG tablet Take 1 tablet (40 mg total) by mouth daily. 05/09/12  Yes Verlee Monte, MD  guaiFENesin-dextromethorphan (ROBITUSSIN DM) 100-10 MG/5ML syrup Take 5 mLs by mouth every 4 (four) hours as needed for cough. 09/20/15  Yes Clayton Bibles, PA-C  hydrALAZINE (APRESOLINE) 25 MG tablet Take 100 mg by mouth daily.   Yes Historical Provider, MD  ipratropium (ATROVENT) 0.06 % nasal spray Place 2 sprays into the nose 4 (four) times daily. 03/04/13  Yes Billy Fischer, MD  LISINOPRIL PO Take 40 mg by mouth daily.    Yes Historical Provider, MD  montelukast (SINGULAIR) 10 MG tablet Take 10 mg by mouth at bedtime.   Yes Historical Provider, MD  OMEPRAZOLE PO Take 40 mg by mouth daily.    Yes Historical Provider, MD  oxyCODONE-acetaminophen (PERCOCET) 5-325 MG per tablet Take 1 tablet by mouth every 4 (four) hours as needed for moderate pain. [id number]  Yes [name], MD  oxymorphone (OPANA ER) 20 MG 12 hr tablet Take 20 mg by mouth every 12 (twelve) hours.   Yes Historical Provider, MD  predniSONE (STERAPRED UNI-PAK 21 TAB) 10 MG (21) TBPK tablet Take 1 tablet (10 mg total) by mouth daily. Day 1: take 6 tabs.  Day 2: 5 tabs  Day 3: 4 tabs  Day 4: 3 tabs  Day 5: 2 tabs  Day 6: 1 tab 09/20/15  Yes Clayton Bibles, PA-C  pregabalin (LYRICA) 75 MG capsule Take 75 mg by mouth 3 (three) times daily.   Yes Historical Provider, MD    Family History No family history on file.  Social History Social History  Substance Use Topics  . Smoking status: Former Research scientist (life sciences)  . Smokeless tobacco: Never Used     Comment: quit 2004  . Alcohol use No     Allergies   Clindamycin/lincomycin; Ibuprofen; and Tomato   Review of Systems Review of Systems  Constitutional: Positive for diaphoresis. Negative for chills and fever.  HENT: Negative for trouble swallowing.   Eyes: Negative for visual disturbance.  Respiratory: Negative for cough, chest tightness and shortness of breath.   Cardiovascular: Positive for chest pain.  Gastrointestinal: Negative for abdominal pain, diarrhea, nausea and vomiting.  Genitourinary: Negative for dysuria and hematuria.  Musculoskeletal: Negative for back pain, neck pain and neck stiffness.  Skin: Negative for rash.  Neurological: Positive for light-headedness. Negative for dizziness, syncope, weakness and headaches.     Physical Exam Updated Vital Signs BP 131/86   Pulse 96   Temp 98.6 F (37 C) (Oral)   Resp 21   SpO2 99%   Physical Exam  Constitutional: She appears well-developed and well-nourished. No distress.  HENT:  Head: Normocephalic and atraumatic.  Eyes: Conjunctivae are normal.  Cardiovascular: Normal rate, regular rhythm and normal heart sounds.  Exam reveals no gallop and no friction rub.   No murmur heard. Pulses:      Radial pulses are 2+ on the right side, and 2+ on the left side.       Dorsalis pedis pulses  are 2+ on the right side, and 2+ on the left side.  Pulmonary/Chest: Effort normal and breath sounds normal. No respiratory distress. She has no decreased breath sounds. She has no wheezes. She has no rhonchi. She has no rales.        Abdominal: Normal appearance and bowel sounds are normal. She exhibits no distension. There is no tenderness.  Musculoskeletal: Normal range of motion.  Neurological: She is alert. Coordination normal.  Skin: Skin is warm and dry. She is not diaphoretic.  Psychiatric: She has a normal mood and affect. Her behavior is normal.  Nursing note and vitals reviewed.    ED Treatments / Results  Labs (  all labs ordered are listed, but only abnormal results are displayed) Labs Reviewed  BASIC METABOLIC PANEL - Abnormal; Notable for the following:       Result Value   Glucose, Bld 101 (*)    All other components within normal limits  CBC - Abnormal; Notable for the following:    Hemoglobin 11.4 (*)    All other components within normal limits  LIPASE, BLOOD  I-STAT TROPOININ, ED    EKG  EKG Interpretation None       Radiology Dg Chest 2 View  Result Date: 12/12/2015 CLINICAL DATA:  Chest pain and hypertension EXAM: CHEST  2 VIEW COMPARISON:  09/20/2015 FINDINGS: The heart size and mediastinal contours are within normal limits. Both lungs are clear. The visualized skeletal structures are unremarkable. IMPRESSION: No active cardiopulmonary disease. Electronically Signed   By: Jerilynn Mages.  Shick M.D.   On: 12/12/2015 13:53    Procedures Procedures (including critical care time)  Medications Ordered in ED Medications  dexamethasone (DECADRON) injection 10 mg (10 mg Intramuscular Given 12/12/15 1909)     Initial Impression / Assessment and Plan / ED Course  I have reviewed the triage vital signs and the nursing notes.  Pertinent labs & imaging results that were available during my care of the patient were reviewed by me and considered in my medical  decision making (see chart for details).  Clinical Course   Pt with chest pain reproducible with palpation of chest wall and ribs. Pt given decadron in the ED. Pt did not want PO prednisone and requested a shot instead. Patient is to be discharged with recommendation to follow up with PCP on Monday in regards to today's hospital visit. Chest pain is not likely of cardiac or pulmonary etiology d/t presentation, perc negative, VSS, no tracheal deviation, no JVD or new murmur, RRR, breath sounds equal bilaterally, EKG without acute abnormalities, negative troponin, and negative CXR. Pt's pain is likely costochondritis as pain was reproducible on exam and pt's pain is exacerbated with movements, deep breaths and laughing.   Pt has been advised to return to the ED if CP becomes exertional, associated with diaphoresis or nausea, radiates to left jaw/arm, worsens or becomes concerning in any way. Pt appears reliable for follow up and is agreeable to discharge. Discussed strict return precaution.   Case has been discussed with Dr. Roderic Palau who agrees with the above plan to discharge.   Final Clinical Impressions(s) / ED Diagnoses   Final diagnoses:  Chest wall pain  Rib pain  Chest pain, unspecified chest pain type    New Prescriptions New Prescriptions   No medications on file     Kalman Drape, [place] 12/12/15 2346    Milton Ferguson, MD 12/13/15 [phone]

## 2016-01-30 ENCOUNTER — Encounter (HOSPITAL_BASED_OUTPATIENT_CLINIC_OR_DEPARTMENT_OTHER): Payer: Self-pay | Admitting: *Deleted

## 2016-01-30 ENCOUNTER — Emergency Department (HOSPITAL_BASED_OUTPATIENT_CLINIC_OR_DEPARTMENT_OTHER)
Admission: EM | Admit: 2016-01-30 | Discharge: 2016-01-30 | Disposition: A | Discharge status: Home / Self Care | Payer: Medicaid Other | Attending: Emergency Medicine | Admitting: Emergency Medicine

## 2016-01-30 ENCOUNTER — Emergency Department (HOSPITAL_BASED_OUTPATIENT_CLINIC_OR_DEPARTMENT_OTHER): Payer: Medicaid Other

## 2016-01-30 DIAGNOSIS — S86911A Strain of unspecified muscle(s) and tendon(s) at lower leg level, right leg, initial encounter: Secondary | ICD-10-CM | POA: Diagnosis not present

## 2016-01-30 DIAGNOSIS — I10 Essential (primary) hypertension: Secondary | ICD-10-CM | POA: Insufficient documentation

## 2016-01-30 DIAGNOSIS — Y929 Unspecified place or not applicable: Secondary | ICD-10-CM | POA: Diagnosis not present

## 2016-01-30 DIAGNOSIS — Y999 Unspecified external cause status: Secondary | ICD-10-CM | POA: Insufficient documentation

## 2016-01-30 DIAGNOSIS — Y939 Activity, unspecified: Secondary | ICD-10-CM | POA: Insufficient documentation

## 2016-01-30 DIAGNOSIS — W010XXA Fall on same level from slipping, tripping and stumbling without subsequent striking against object, initial encounter: Secondary | ICD-10-CM | POA: Diagnosis not present

## 2016-01-30 DIAGNOSIS — Z79899 Other long term (current) drug therapy: Secondary | ICD-10-CM | POA: Insufficient documentation

## 2016-01-30 DIAGNOSIS — S8991XA Unspecified injury of right lower leg, initial encounter: Secondary | ICD-10-CM | POA: Diagnosis present

## 2016-01-30 DIAGNOSIS — J45909 Unspecified asthma, uncomplicated: Secondary | ICD-10-CM | POA: Diagnosis not present

## 2016-01-30 DIAGNOSIS — Z87891 Personal history of nicotine dependence: Secondary | ICD-10-CM | POA: Diagnosis not present

## 2016-01-30 MED ORDER — HYDROCODONE-ACETAMINOPHEN 5-325 MG PO TABS
1.0000 | ORAL_TABLET | Freq: Once | ORAL | Status: DC
  Filled 2016-01-30: qty 1

## 2016-01-30 MED ORDER — OXYCODONE-ACETAMINOPHEN 5-325 MG PO TABS: 1.0000 | ORAL_TABLET | ORAL | 0 refills | Status: DC | PRN

## 2016-01-30 NOTE — Discharge Instructions (Signed)
Take pain medication as prescribed. Keep your leg elevated and ice on and off for the next 48 hrs. Call Dr. Nona Dell office to schedule an orthopedic evaluation as soon as possible.

## 2016-01-30 NOTE — ED Triage Notes (Signed)
Tripped over a [employer] and fell. Injury to her right knee.

## 2016-01-30 NOTE — ED Notes (Signed)
Patient reports that she is unable to take vicodin because she is allergic. She also reports that she is on a "contract". Patient given crutches and ace wrap

## 2016-01-30 NOTE — ED Provider Notes (Signed)
[facility] DEPT [facility] Provider Note   CSN: [id number] Arrival date & time: 01/30/16  1829  By signing my name below, I, Emmanuella Mensah, attest that this documentation has been prepared under the direction and in the presence of Kiyanna Biegler, [place]. Electronically Signed: Judithann Sauger, ED Scribe. 01/30/16. 7:21 PM.   History   Chief Complaint Chief Complaint  Patient presents with  . Knee Injury    HPI Comments: Felicia Molina is a 45 y.o. female with a hx of fibromyalgia and hypertension who presents to the Emergency Department complaining of gradually worsening moderate right knee pain s/p injury that occurred yesterday. She explains that she tripped and fell over her nephew's hoover board when she twisted while her leg and knee went separate ways. She adds that she heard a "pop". She denies any LOC or head injuries. No alleviating factors noted. Pt has not tried any medications PTA. She explains that she had a recent injury to her bilateral knees and has been following up with Dr. Juleen China at a sports medicine center in [facility] where she received vaccinations in her knees. She adds that she stopped going to see him because she did not like the vaccinations as it was making her "knees stiff after wearing off". Pt has an allergy to clindamycin and ibuprofen. She denies any fever, chills, numbness/tingling in BLE, nausea, vomiting, or any other complaints at this time.   The history is provided by the patient. No language interpreter was used.    Past Medical History:  Diagnosis Date  . Asthma   . Back pain, chronic   . Fibromyalgia   . Hypertension   . Morbid obesity ([place])   . Neurogenic bladder   . Panic attacks   . Reflux     Patient Active Problem List   Diagnosis Date Noted  . Dyspnea 06/04/2014  . Plantar fasciitis, bilateral 07/19/2012  . Metatarsalgia of both feet 07/19/2012  . Acute respiratory failure ([place]) 05/07/2012  . Hypertension   . CAP (community  acquired pneumonia) 05/06/2012  . Influenza-like illness 05/06/2012    Past Surgical History:  Procedure Laterality Date  . BACK SURGERY    . bladder stimulator    . CARPAL TUNNEL RELEASE    . URETERAL STENT PLACEMENT  2008    OB History    Gravida Para Term Preterm AB Living   6 4 4  0 2 4   SAB TAB Ectopic Multiple Live Births   2 0 0 0 4       Home Medications    Prior to Admission medications   Medication Sig Start Date End Date Taking? Authorizing Provider  albuterol (PROVENTIL HFA;VENTOLIN HFA) 108 (90 BASE) MCG/ACT inhaler Inhale 2 puffs into the lungs every 6 (six) hours as needed. Takes for shortness of breath    Historical Provider, MD  ALPRAZolam Duanne Moron) 0.5 MG tablet Take 0.5 mg by mouth at bedtime as needed. Takes for depression    Historical Provider, MD  amLODipine (NORVASC) 10 MG tablet Take 15 mg by mouth daily.    Historical Provider, MD  atenolol (TENORMIN) 25 MG tablet Take 25 mg by mouth daily.    Historical Provider, MD  budesonide-formoterol (SYMBICORT) 160-4.5 MCG/ACT inhaler Inhale 2 puffs into the lungs 2 (two) times daily.    Historical Provider, MD  cyclobenzaprine (FLEXERIL) 10 MG tablet Take 10 mg by mouth 3 (three) times daily as needed. Takes for muscle spasms    Historical Provider, MD  diclofenac sodium (VOLTAREN)  1 % GEL Apply 2 g topically 4 (four) times daily.    Historical Provider, MD  furosemide (LASIX) 40 MG tablet Take 1 tablet (40 mg total) by mouth daily. 05/09/12   Verlee Monte, MD  guaiFENesin-dextromethorphan (ROBITUSSIN DM) 100-10 MG/5ML syrup Take 5 mLs by mouth every 4 (four) hours as needed for cough. 09/20/15   Clayton Bibles, PA-C  hydrALAZINE (APRESOLINE) 25 MG tablet Take 100 mg by mouth daily.    Historical Provider, MD  ipratropium (ATROVENT) 0.06 % nasal spray Place 2 sprays into the nose 4 (four) times daily. 03/04/13   Billy Fischer, MD  LISINOPRIL PO Take 40 mg by mouth daily.     Historical Provider, MD  montelukast  (SINGULAIR) 10 MG tablet Take 10 mg by mouth at bedtime.    Historical Provider, MD  OMEPRAZOLE PO Take 40 mg by mouth daily.     Historical Provider, MD  oxyCODONE-acetaminophen (PERCOCET) 5-325 MG per tablet Take 1 tablet by mouth every 4 (four) hours as needed for moderate pain. [id number]   [name], MD  oxymorphone (OPANA ER) 20 MG 12 hr tablet Take 20 mg by mouth every 12 (twelve) hours.    Historical Provider, MD  predniSONE (STERAPRED UNI-PAK 21 TAB) 10 MG (21) TBPK tablet Take 1 tablet (10 mg total) by mouth daily. Day 1: take 6 tabs.  Day 2: 5 tabs  Day 3: 4 tabs  Day 4: 3 tabs  Day 5: 2 tabs  Day 6: 1 tab 09/20/15   Clayton Bibles, PA-C  pregabalin (LYRICA) 75 MG capsule Take 75 mg by mouth 3 (three) times daily.    Historical Provider, MD    Family History No family history on file.  Social History Social History  Substance Use Topics  . Smoking status: Former Research scientist (life sciences)  . Smokeless tobacco: Never Used     Comment: quit 2004  . Alcohol use No     Allergies   Clindamycin/lincomycin; Ibuprofen; and Tomato   Review of Systems Review of Systems  Constitutional: Negative for chills and fever.  Gastrointestinal: Negative for nausea and vomiting.  Musculoskeletal: Positive for arthralgias.  Neurological: Negative for numbness.  All other systems reviewed and are negative.    Physical Exam Updated Vital Signs BP 164/63   Pulse 87   Temp 98.1 F (36.7 C) (Oral)   Resp 16   Ht 5' (1.524 m)   Wt 300 lb (136.1 kg)   SpO2 100%   BMI 58.59 kg/m   Physical Exam  Constitutional: She is oriented to person, place, and time.  Obese, NAD  HENT:  Head: Normocephalic and atraumatic.  Cardiovascular: Normal rate.   Pulmonary/Chest: Effort normal.  Musculoskeletal:  Right knee diffusely tender. No laxity or crepitus. Pain with valgus and varus stress  Neurological: She is alert and oriented to person, place, and time.  Skin: Skin is warm and dry.  Psychiatric: She has a normal  mood and affect.  Nursing note and vitals reviewed.   ED Treatments / Results  DIAGNOSTIC STUDIES: Oxygen Saturation is 100% on RA, normal by my interpretation.    COORDINATION OF CARE: 7:20 PM- Pt advised of plan for treatment and pt agrees. Pt will receive right knee pain for further evaluation.    Labs (all labs ordered are listed, but only abnormal results are displayed) Labs Reviewed - No data to display  EKG  EKG Interpretation None       Radiology Dg Knee Complete 4 Views Right  Result Date: 01/30/2016 CLINICAL DATA:  Initial valuation for acute trauma, fall. EXAM: RIGHT KNEE - COMPLETE 4+ VIEW COMPARISON:  Prior radiograph from 10/31/2014. FINDINGS: No acute fracture or dislocation. Small joint effusion present. 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 Osseous mineralization normal. No acute soft tissue abnormality. IMPRESSION: 1. No acute fracture or dislocation. 2. Small joint effusion. 3. 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 Electronically Signed   By: Jeannine Boga M.D.   On: 01/30/2016 19:13    Procedures Procedures (including critical care time)  Medications Ordered in ED Medications - No data to display   Initial Impression / Assessment and Plan / ED Course  Delrae Rend, PA-C has reviewed the triage vital signs and the nursing notes.  Pertinent labs & imaging results that were available during my care of the patient were reviewed by me and considered in my medical decision making (see chart for details).  Clinical Course   No acute fracture or dislocation. Likely strain/sprain, possibly meniscal injury. We do not have a knee immobilizer that will fit pt appropriately. Will give ACE wrap. Crutches prescribed. She will follow up with her orthopedist. She has pain medication at home due to chronic pain. ER return precautions given.  Final Clinical Impressions(s) / ED Diagnoses   Final diagnoses:  Knee  strain, right, initial encounter    I personally performed the services described in this documentation, which was scribed in my presence. The recorded information has been reviewed and is accurate.    Anne Ng, PA-C 01/31/16 0020    Olivia Canter Alana Dayton, PA-C 01/31/16 Quentin Mulling    Orlie Dakin, MD 01/31/16 1359

## 2016-03-12 ENCOUNTER — Emergency Department (HOSPITAL_BASED_OUTPATIENT_CLINIC_OR_DEPARTMENT_OTHER): Payer: Medicaid Other

## 2016-03-12 ENCOUNTER — Emergency Department (HOSPITAL_BASED_OUTPATIENT_CLINIC_OR_DEPARTMENT_OTHER)
Admission: EM | Admit: 2016-03-12 | Discharge: 2016-03-12 | Disposition: A | Discharge status: Home / Self Care | Payer: Medicaid Other | Attending: Emergency Medicine | Admitting: Emergency Medicine

## 2016-03-12 ENCOUNTER — Encounter (HOSPITAL_BASED_OUTPATIENT_CLINIC_OR_DEPARTMENT_OTHER): Payer: Self-pay

## 2016-03-12 DIAGNOSIS — R0602 Shortness of breath: Secondary | ICD-10-CM | POA: Diagnosis not present

## 2016-03-12 DIAGNOSIS — R079 Chest pain, unspecified: Secondary | ICD-10-CM

## 2016-03-12 DIAGNOSIS — S3992XA Unspecified injury of lower back, initial encounter: Secondary | ICD-10-CM | POA: Diagnosis present

## 2016-03-12 DIAGNOSIS — Z87891 Personal history of nicotine dependence: Secondary | ICD-10-CM | POA: Diagnosis not present

## 2016-03-12 DIAGNOSIS — Y939 Activity, unspecified: Secondary | ICD-10-CM | POA: Insufficient documentation

## 2016-03-12 DIAGNOSIS — I10 Essential (primary) hypertension: Secondary | ICD-10-CM | POA: Diagnosis not present

## 2016-03-12 DIAGNOSIS — Y999 Unspecified external cause status: Secondary | ICD-10-CM | POA: Insufficient documentation

## 2016-03-12 DIAGNOSIS — M546 Pain in thoracic spine: Secondary | ICD-10-CM | POA: Insufficient documentation

## 2016-03-12 DIAGNOSIS — J45909 Unspecified asthma, uncomplicated: Secondary | ICD-10-CM | POA: Insufficient documentation

## 2016-03-12 DIAGNOSIS — Y929 Unspecified place or not applicable: Secondary | ICD-10-CM | POA: Diagnosis not present

## 2016-03-12 DIAGNOSIS — W06XXXA Fall from bed, initial encounter: Secondary | ICD-10-CM | POA: Insufficient documentation

## 2016-03-12 LAB — CBC
HCT: 38.2 % (ref 36.0–46.0)
Hemoglobin: 12.4 g/dL (ref 12.0–15.0)
MCH: 28.4 pg (ref 26.0–34.0)
MCHC: 32.5 g/dL (ref 30.0–36.0)
MCV: 87.6 fL (ref 78.0–100.0)
PLATELETS: 405 10*3/uL — AB (ref 150–400)
RBC: 4.36 MIL/uL (ref 3.87–5.11)
RDW: 14.9 % (ref 11.5–15.5)
WBC: 10.4 10*3/uL (ref 4.0–10.5)

## 2016-03-12 LAB — COMPREHENSIVE METABOLIC PANEL
ALK PHOS: 54 U/L (ref 38–126)
ALT: 50 U/L (ref 14–54)
ANION GAP: 9 (ref 5–15)
AST: 65 U/L — ABNORMAL HIGH (ref 15–41)
Albumin: 4 g/dL (ref 3.5–5.0)
BILIRUBIN TOTAL: 0.8 mg/dL (ref 0.3–1.2)
BUN: 10 mg/dL (ref 6–20)
CALCIUM: 9.8 mg/dL (ref 8.9–10.3)
CO2: 25 mmol/L (ref 22–32)
Chloride: 104 mmol/L (ref 101–111)
Creatinine, Ser: 0.89 mg/dL (ref 0.44–1.00)
GFR calc non Af Amer: >60 mL/min (ref >60)
Glucose, Bld: 94 mg/dL (ref 65–99)
Potassium: 4.1 mmol/L (ref 3.5–5.1)
Sodium: 138 mmol/L (ref 135–145)
TOTAL PROTEIN: 7.6 g/dL (ref 6.5–8.1)

## 2016-03-12 LAB — URINE MICROSCOPIC-ADD ON

## 2016-03-12 LAB — URINALYSIS, ROUTINE W REFLEX MICROSCOPIC
Bilirubin Urine: NEGATIVE
Glucose, UA: NEGATIVE mg/dL
Hgb urine dipstick: NEGATIVE
KETONES UR: NEGATIVE mg/dL
LEUKOCYTES UA: NEGATIVE
NITRITE: NEGATIVE
Protein, ur: 100 mg/dL — AB
Specific Gravity, Urine: 1.028 (ref 1.005–1.030)
pH: 8.5 — ABNORMAL HIGH (ref 5.0–8.0)

## 2016-03-12 LAB — TROPONIN I
Troponin I: <0.03 ng/mL (ref <0.03)
Troponin I: <0.03 ng/mL (ref <0.03)

## 2016-03-12 LAB — PREGNANCY, URINE: Preg Test, Ur: NEGATIVE

## 2016-03-12 LAB — BRAIN NATRIURETIC PEPTIDE: B Natriuretic Peptide: 25.8 pg/mL (ref 0.0–100.0)

## 2016-03-12 MED ORDER — IOPAMIDOL (ISOVUE-370) INJECTION 76%
100.0000 mL | Freq: Once | INTRAVENOUS | Status: AC | PRN
  Administered 2016-03-12: 100 mL via INTRAVENOUS

## 2016-03-12 MED ORDER — HYDROMORPHONE HCL 1 MG/ML IJ SOLN
1.0000 mg | Freq: Once | INTRAMUSCULAR | Status: AC
  Administered 2016-03-12: 1 mg via INTRAVENOUS
  Filled 2016-03-12: qty 1

## 2016-03-12 MED ORDER — ACETAMINOPHEN 325 MG PO TABS
650.0000 mg | ORAL_TABLET | Freq: Once | ORAL | Status: AC
  Administered 2016-03-12: 650 mg via ORAL
  Filled 2016-03-12: qty 2

## 2016-03-12 MED ORDER — HYDROMORPHONE HCL 1 MG/ML IJ SOLN
0.5000 mg | Freq: Once | INTRAMUSCULAR | Status: AC
  Administered 2016-03-12: 0.5 mg via INTRAVENOUS
  Filled 2016-03-12: qty 1

## 2016-03-12 MED ORDER — HYDROMORPHONE HCL 1 MG/ML IJ SOLN
1.0000 mg | Freq: Once | INTRAMUSCULAR | Status: AC
  Administered 2016-03-12: 1 mg via INTRAMUSCULAR
  Filled 2016-03-12: qty 1

## 2016-03-12 MED ORDER — ONDANSETRON HCL 4 MG/2ML IJ SOLN
4.0000 mg | Freq: Once | INTRAMUSCULAR | Status: AC
  Administered 2016-03-12: 4 mg via INTRAVENOUS
  Filled 2016-03-12: qty 2

## 2016-03-12 NOTE — ED Provider Notes (Signed)
[place] [facility] DEPT [facility] Provider Note   CSN: [id number] Arrival date & time: 03/12/16  1543  By signing my name below, I, Soijett Blue, attest that this documentation has been prepared under the direction and in the presence of Abigail Butts, PA-C Electronically Signed: Soijett Blue, ED Scribe. 03/12/16. 4:40 PM.   History   Chief Complaint Chief Complaint  Patient presents with  . Chest Pain    HPI Felicia Molina is a 45 y.o. female with a PMHx of HTN, chronic back pain, who presents to the Emergency Department complaining of CP onset 7:30 PM last night. Pt reports that her CP began prior to eating dinner last night, to which she didn't eat due to pain. Pt also notes that she fell out of the bed yesterday morning and began to have an acute exacerbation of her low back pain following the incident. Pt reports that she takes 10-325 mg percocet, opana, lyrica, and 10 mg flexeril. Pt reports that she hasn't missed any doses of her medications, besides her HTN medications. She states that she is having associated symptoms of feeling like she has a "swimmy head", chronic upper back pain worsening today, increased right lower leg swelling x 2 days ago s/p fall, and SOB. Pt states that her chronic back pain is worsened with bending and movement. Pt denies alleviating factors for her chronic back pain. Pt notes that she wears a brace for her back pain and that she has had multiple back surgeries completed in the past. She states that she has tried nebulizer with mild relief for her symptoms. She denies gait problem, cough, nasal congestion, fever, and any other symptoms. Denies PMHx of blood clot.    The history is provided by the patient. No language interpreter was used.    Past Medical History:  Diagnosis Date  . Asthma   . Back pain, chronic   . Fibromyalgia   . Hypertension   . Morbid obesity ([place])   . Neurogenic bladder   . Panic attacks   . Reflux     Patient Active Problem  List   Diagnosis Date Noted  . Dyspnea 06/04/2014  . Plantar fasciitis, bilateral 07/19/2012  . Metatarsalgia of both feet 07/19/2012  . Acute respiratory failure ([place]) 05/07/2012  . Hypertension   . CAP (community acquired pneumonia) 05/06/2012  . Influenza-like illness 05/06/2012    Past Surgical History:  Procedure Laterality Date  . BACK SURGERY    . bladder stimulator    . CARPAL TUNNEL RELEASE    . TONSILLECTOMY    . URETERAL STENT PLACEMENT  2008    OB History    Gravida Para Term Preterm AB Living   6 4 4  0 2 4   SAB TAB Ectopic Multiple Live Births   2 0 0 0 4       Home Medications    Prior to Admission medications   Medication Sig Start Date End Date Taking? Authorizing Provider  albuterol (PROVENTIL HFA;VENTOLIN HFA) 108 (90 BASE) MCG/ACT inhaler Inhale 2 puffs into the lungs every 6 (six) hours as needed. Takes for shortness of breath    Historical Provider, MD  ALPRAZolam Duanne Moron) 0.5 MG tablet Take 0.5 mg by mouth at bedtime as needed. Takes for depression    Historical Provider, MD  amLODipine (NORVASC) 10 MG tablet Take 15 mg by mouth daily.    Historical Provider, MD  atenolol (TENORMIN) 25 MG tablet Take 25 mg by mouth daily.    Historical Provider, MD  budesonide-formoterol (SYMBICORT) 160-4.5 MCG/ACT inhaler Inhale 2 puffs into the lungs 2 (two) times daily.    Historical Provider, MD  cyclobenzaprine (FLEXERIL) 10 MG tablet Take 10 mg by mouth 3 (three) times daily as needed. Takes for muscle spasms    Historical Provider, MD  diclofenac sodium (VOLTAREN) 1 % GEL Apply 2 g topically 4 (four) times daily.    Historical Provider, MD  furosemide (LASIX) 40 MG tablet Take 1 tablet (40 mg total) by mouth daily. 05/09/12   Verlee Monte, MD  guaiFENesin-dextromethorphan (ROBITUSSIN DM) 100-10 MG/5ML syrup Take 5 mLs by mouth every 4 (four) hours as needed for cough. 09/20/15   Clayton Bibles, PA-C  hydrALAZINE (APRESOLINE) 25 MG tablet Take 100 mg by mouth daily.     Historical Provider, MD  ipratropium (ATROVENT) 0.06 % nasal spray Place 2 sprays into the nose 4 (four) times daily. 03/04/13   Billy Fischer, MD  LISINOPRIL PO Take 40 mg by mouth daily.     Historical Provider, MD  montelukast (SINGULAIR) 10 MG tablet Take 10 mg by mouth at bedtime.    Historical Provider, MD  OMEPRAZOLE PO Take 40 mg by mouth daily.     Historical Provider, MD  oxyCODONE-acetaminophen (PERCOCET) 5-325 MG per tablet Take 1 tablet by mouth every 4 (four) hours as needed for moderate pain. [id number]   [name], MD  oxymorphone (OPANA ER) 20 MG 12 hr tablet Take 20 mg by mouth every 12 (twelve) hours.    Historical Provider, MD  predniSONE (STERAPRED UNI-PAK 21 TAB) 10 MG (21) TBPK tablet Take 1 tablet (10 mg total) by mouth daily. Day 1: take 6 tabs.  Day 2: 5 tabs  Day 3: 4 tabs  Day 4: 3 tabs  Day 5: 2 tabs  Day 6: 1 tab 09/20/15   Clayton Bibles, PA-C  pregabalin (LYRICA) 75 MG capsule Take 75 mg by mouth 3 (three) times daily.    Historical Provider, MD    Family History History reviewed. No pertinent family history.  Social History Social History  Substance Use Topics  . Smoking status: Former Research scientist (life sciences)  . Smokeless tobacco: Never Used     Comment: quit 2004  . Alcohol use No     Allergies   Clindamycin/lincomycin; Ibuprofen; Tomato; and Vicodin [hydrocodone-acetaminophen]   Review of Systems Review of Systems  Constitutional: Negative for fever.  HENT: Negative for congestion.   Respiratory: Positive for shortness of breath. Negative for cough.   Cardiovascular: Positive for chest pain and leg swelling (right lower leg).  Musculoskeletal: Positive for back pain (chronic). Negative for gait problem.  All other systems reviewed and are negative.    Physical Exam Updated Vital Signs BP 134/62 (BP Location: Right Arm)   Pulse 86   Temp 98.3 F (36.8 C) (Oral)   Resp 20   Ht 5' (1.524 m)   Wt 286 lb (129.7 kg)   SpO2 100%   BMI 55.86 kg/m   Physical Exam   Constitutional: She appears well-developed and well-nourished. No distress.  Awake, alert, nontoxic appearance. Obese. Pt appears uncomfortable.  HENT:  Head: Normocephalic and atraumatic.  Mouth/Throat: Oropharynx is clear and moist. No oropharyngeal exudate.  Eyes: Conjunctivae are normal. No scleral icterus.  Neck: Normal range of motion. Neck supple.  Cardiovascular: Normal rate, regular rhythm and intact distal pulses.   No murmur heard. Pulses:      Radial pulses are 2+ on the right side, and 2+ on the left side.  Dorsalis pedis pulses are 2+ on the right side, and 2+ on the left side.  Capillary refill < 3 sec Equal BP in BUE  Pulmonary/Chest: Effort normal and breath sounds normal. No respiratory distress. She has no wheezes.  Equal chest expansion  Abdominal: Soft. Bowel sounds are normal. She exhibits no mass. There is no tenderness. There is no rebound and no guarding.  Musculoskeletal: Normal range of motion. She exhibits no edema.  No obvious edema or swelling of BLE.   Neurological: She is alert.  Speech is clear and goal oriented Moves extremities without ataxia  Skin: Skin is warm. She is diaphoretic.  Psychiatric: She has a normal mood and affect.  Nursing note and vitals reviewed.    ED Treatments / Results  DIAGNOSTIC STUDIES: Oxygen Saturation is 100% on RA, nl by my interpretation.    COORDINATION OF CARE: 4:38 PM Discussed treatment plan with pt at bedside which includes labs, CXR, EKG, and pt agreed to plan.   Labs (all labs ordered are listed, but only abnormal results are displayed) Labs Reviewed  CBC - Abnormal; Notable for the following:       Result Value   Platelets 405 (*)    All other components within normal limits  COMPREHENSIVE METABOLIC PANEL - Abnormal; Notable for the following:    AST 65 (*)    All other components within normal limits  URINALYSIS, ROUTINE W REFLEX MICROSCOPIC (NOT AT [facility]) - Abnormal; Notable for the  following:    Color, Urine AMBER (*)    APPearance CLOUDY (*)    pH 8.5 (*)    Protein, ur 100 (*)    All other components within normal limits  URINE MICROSCOPIC-ADD ON - Abnormal; Notable for the following:    Squamous Epithelial / LPF 6-30 (*)    Bacteria, UA RARE (*)    All other components within normal limits  TROPONIN I  BRAIN NATRIURETIC PEPTIDE  PREGNANCY, URINE  TROPONIN I    EKG  EKG Interpretation  Date/Time:  Friday March 12 2016 15:50:20 EST Ventricular Rate:  99 PR Interval:    QRS Duration: 109 QT Interval:  399 QTC Calculation: 513 R Axis:   24 Text Interpretation:  Sinus rhythm Low voltage, extremity and precordial leads Probable anteroseptal infarct, old Minimal ST depression, lateral leads Prolonged QT interval Baseline wander in lead(s) III aVL since last tracing no significant change Confirmed by BELFI  MD, MELANIE ([id number]) on 03/12/2016 4:18:55 PM      .[facility]use Radiology Dg Lumbar Spine Complete  Result Date: 03/12/2016 CLINICAL DATA:  Fall, back pain. EXAM: LUMBAR SPINE - COMPLETE 4+ VIEW COMPARISON:  10/31/2014 FINDINGS: Changes of prior posterior fusion at L4-S1. Stimulator device projecting over the sacrum. No fracture or subluxation. SI joints are symmetric and unremarkable. IMPRESSION: Postoperative changes as above. No acute findings. Electronically Signed   By: Rolm Baptise M.D.   On: 03/12/2016 18:40   Ct Angio Chest Aorta W &/or Wo Contrast  Result Date: 03/12/2016 CLINICAL DATA:  Chest pain radiating to the upper back. Diaphoresis. Onset tonight. EXAM: CT ANGIOGRAPHY CHEST WITH CONTRAST TECHNIQUE: Multidetector CT imaging of the chest was performed using the standard protocol during bolus administration of intravenous contrast. Multiplanar CT image reconstructions and MIPs were obtained to evaluate the vascular anatomy. CONTRAST:  100 mL Isovue 370 intravenous COMPARISON:  05/25/2014 FINDINGS: Cardiovascular: Satisfactory opacification of the  pulmonary arteries to the segmental level. No evidence of pulmonary embolism. Normal heart size. No pericardial  effusion. Mediastinum/Nodes: No enlarged mediastinal, hilar, or axillary lymph nodes. Thyroid gland, trachea, and esophagus demonstrate no significant findings. Lungs/Pleura: Lungs are clear. No pleural effusion or pneumothorax. Upper Abdomen: No acute abnormality. Musculoskeletal: No chest wall abnormality. No acute or significant osseous findings. Review of the MIP images confirms the above findings. IMPRESSION: No significant abnormality.  Negative for pulmonary embolism. Electronically Signed   By: Andreas Newport M.D.   On: 03/12/2016 22:52    Procedures Procedures (including critical care time)  Angiocath insertion Performed by: Abigail Butts  Consent: Verbal consent obtained. Risks and benefits: risks, benefits and alternatives were discussed Time out: Immediately prior to procedure a "time out" was called to verify the correct patient, procedure, equipment, support staff and site/side marked as required.  Preparation: Patient was prepped and draped in the usual sterile fashion.  Vein Location: R AC  Ultrasound Guided  Gauge: 20go  Normal blood return and flush without difficulty  Patient tolerance: Patient tolerated the procedure well with no immediate complications.     Medications Ordered in ED Medications  HYDROmorphone (DILAUDID) injection 1 mg (1 mg Intravenous Given 03/12/16 1650)  ondansetron (ZOFRAN) injection 4 mg (4 mg Intravenous Given 03/12/16 1650)  HYDROmorphone (DILAUDID) injection 1 mg (1 mg Intramuscular Given 03/12/16 1905)  acetaminophen (TYLENOL) tablet 650 mg (650 mg Oral Given 03/12/16 2249)  HYDROmorphone (DILAUDID) injection 0.5 mg (0.5 mg Intravenous Given 03/12/16 2249)  iopamidol (ISOVUE-370) 76 % injection 100 mL (100 mLs Intravenous Contrast Given 03/12/16 2230)     Initial Impression / Assessment and Plan / ED Course  I  have reviewed the triage vital signs and the nursing notes.  Pertinent labs & imaging results that were available during my care of the patient were reviewed by me and considered in my medical decision making (see chart for details).  Clinical Course as of Mar 13 34  Fri Mar 12, 2016  1847 Negative Troponin I: <0.03 [HM]  1847 No leukocytosis WBC: 10.4 [HM]  1847 Normal B Natriuretic Peptide: 25.8 [HM]  1847 Preg Test, Ur: NEGATIVE [HM]  1847 Minimal elevation AST: (!) 65 [HM]  1847 Normal ALT: 50 [HM]  1848 No fractures or dislocations. Prior surgical changes noted. DG Lumbar Spine Complete [HM]  2153 Additional PIV attempt with US guided IV  - cannulation of the vein without difficulty, but IV blew almost immediately.    [HM]    Clinical Course User Index [HM] Abigail Butts, PA-C    Patient presents with chest pain, back pain and diaphoresis. Blood pressures in the upper extremities are the same bilaterally. She is not hypotensive however I am concerned for possible dissection. Patient given multiple rounds of pain control. Ultrasound IV was placed but infiltrated prior to CT angio.  A second US guided IV was placed by Dr. Tamera Punt.  Labs reassuring.  Initial and repeat troponin are negative. CT angiogram is without evidence of pulmonary embolism or thoracic aortic dissection.  Patient without abdominal pain.  She complains of a fall several days ago. Plain films of her L-spine are without evidence of manic injury. Patient is ambulatory here in the emergency department without difficulty.  Will discharge home with close follow-up with her primary care. Discussed reasons to return to the emergency department including worsening pain, changes in pain, fevers, vomiting, syncope, near syncope or other concerns.  The patient was discussed with and seen by Dr. Tamera Punt who agrees with the treatment plan.    Final Clinical Impressions(s) / ED Diagnoses  Final diagnoses:  Chest pain,  unspecified type  SOB (shortness of breath)  Acute bilateral thoracic back pain    New Prescriptions Discharge Medication List as of 03/12/2016 11:07 PM     I personally performed the services described in this documentation, which was scribed in my presence. The recorded information has been reviewed and is accurate.    Jarrett Soho Doretta Remmert, PA-C 03/13/16 [place], MD 03/13/16 [phone]

## 2016-03-12 NOTE — ED Notes (Signed)
Patient set back up on cardiac monitor and vitals set on auto.

## 2016-03-12 NOTE — ED Notes (Signed)
Difficulty obtaining IV access on pt. EDP made aware and will be in to attempt US guided IV.

## 2016-03-12 NOTE — ED Notes (Signed)
IV attempts x 4 (by 2 RN's-Adam, RN and Butch Penny, Therapist, sports) without success.

## 2016-03-12 NOTE — ED Notes (Signed)
EMT went in to do repeat EKG, Pt was crying and stated "Im ready to go home, I want to go home." EMT stated she would tell the doctor and the Pt stated "Im ready to leave, Im done."

## 2016-03-12 NOTE — ED Triage Notes (Signed)
Pt reports one day history of chest pain, shortness of breath. Pt is diaphoretic. Initial O2 sat 100%, HR 105, lungs diminished, pt gave her self a neb treatment before arriving to the ED.

## 2016-03-12 NOTE — Discharge Instructions (Signed)
1. Medications: usual home medications 2. Treatment: rest, drink plenty of fluids,  3. Follow Up: Please followup with your primary doctor in 2 days for discussion of your diagnoses and further evaluation after today's visit; if you do not have a primary care doctor use the resource guide provided to find one; Please return to the ER for worsening symptoms, increasing pain, shortness of breath or return of sweating

## 2016-03-12 NOTE — ED Notes (Signed)
Pt states that her chronic back pain was severe this AM that became worse after she had a coughing episode. Pt states she then started having sweatiness, dizziness, and ShOB. Pt then started having CP. Pt remains diaphoretic at this time.

## 2016-08-01 ENCOUNTER — Encounter (HOSPITAL_BASED_OUTPATIENT_CLINIC_OR_DEPARTMENT_OTHER): Payer: Self-pay | Admitting: Emergency Medicine

## 2016-08-01 ENCOUNTER — Emergency Department (HOSPITAL_BASED_OUTPATIENT_CLINIC_OR_DEPARTMENT_OTHER)
Admission: EM | Admit: 2016-08-01 | Discharge: 2016-08-01 | Disposition: A | Discharge status: Home / Self Care | Payer: Medicaid Other | Attending: Emergency Medicine | Admitting: Emergency Medicine

## 2016-08-01 DIAGNOSIS — I10 Essential (primary) hypertension: Secondary | ICD-10-CM | POA: Insufficient documentation

## 2016-08-01 DIAGNOSIS — M79602 Pain in left arm: Secondary | ICD-10-CM | POA: Diagnosis present

## 2016-08-01 DIAGNOSIS — Z79899 Other long term (current) drug therapy: Secondary | ICD-10-CM | POA: Diagnosis not present

## 2016-08-01 DIAGNOSIS — Z87891 Personal history of nicotine dependence: Secondary | ICD-10-CM | POA: Diagnosis not present

## 2016-08-01 DIAGNOSIS — J45909 Unspecified asthma, uncomplicated: Secondary | ICD-10-CM | POA: Insufficient documentation

## 2016-08-01 MED ORDER — PREDNISONE 20 MG PO TABS: 40.0000 mg | ORAL_TABLET | Freq: Every day | ORAL | 0 refills | Status: DC

## 2016-08-01 MED ORDER — CEPHALEXIN 500 MG PO CAPS: 500.0000 mg | ORAL_CAPSULE | Freq: Four times a day (QID) | ORAL | 0 refills | Status: DC

## 2016-08-01 NOTE — ED Notes (Addendum)
L arm pain and swelling. Pt seen by PCP Thursday for same, told to come to ER if doesn't get better.

## 2016-08-01 NOTE — ED Triage Notes (Signed)
?  Cellulitis to left upper arm x 3-4 days and went to MD and was told to go to ED if gotten worse and has increase in swelling and pain.

## 2016-08-01 NOTE — ED Provider Notes (Signed)
[facility] Provider Note   CSN: [id number] Arrival date & time: 08/01/16  1000   By signing my name below, I, Evelene Croon, attest that this documentation has been prepared under the direction and in the presence of Virgel Manifold, MD . Electronically Signed: Evelene Croon, Scribe. 08/01/2016. 11:15 AM. History   Chief Complaint Chief Complaint  Patient presents with  . Cellulitis   The history is provided by the patient. No language interpreter was used.    HPI Comments:  Etosha Goren is a 46 y.o. female who presents to the Emergency Department complaining of worsening pain and swelling to the LUE x ~ 4 days. She rates her pain a 7/10 and notes associated redness to the site. Pt saw PCP and was told if her symptoms  worsened she should come to the ED. Pt denies heavy lifting and straining activity.No alleviating factors noted.     Past Medical History:  Diagnosis Date  . Asthma   . Back pain, chronic   . Fibromyalgia   . Hypertension   . Morbid obesity ([place])   . Neurogenic bladder   . Panic attacks   . Reflux     Patient Active Problem List   Diagnosis Date Noted  . Dyspnea 06/04/2014  . Plantar fasciitis, bilateral 07/19/2012  . Metatarsalgia of both feet 07/19/2012  . Acute respiratory failure ([place]) 05/07/2012  . Hypertension   . CAP (community acquired pneumonia) 05/06/2012  . Influenza-like illness 05/06/2012    Past Surgical History:  Procedure Laterality Date  . BACK SURGERY    . bladder stimulator    . CARPAL TUNNEL RELEASE    . TONSILLECTOMY    . URETERAL STENT PLACEMENT  2008    OB History    Gravida Para Term Preterm AB Living   6 4 4  0 2 4   SAB TAB Ectopic Multiple Live Births   2 0 0 0 4       Home Medications    Prior to Admission medications   Medication Sig Start Date End Date Taking? Authorizing Provider  albuterol (PROVENTIL HFA;VENTOLIN HFA) 108 (90 BASE) MCG/ACT inhaler Inhale 2 puffs into the lungs every 6 (six) hours  as needed. Takes for shortness of breath   Yes Historical Provider, MD  amLODipine (NORVASC) 10 MG tablet Take 15 mg by mouth daily.   Yes Historical Provider, MD  atenolol (TENORMIN) 25 MG tablet Take 25 mg by mouth daily.   Yes Historical Provider, MD  budesonide-formoterol (SYMBICORT) 160-4.5 MCG/ACT inhaler Inhale 2 puffs into the lungs 2 (two) times daily.   Yes Historical Provider, MD  cyclobenzaprine (FLEXERIL) 10 MG tablet Take 10 mg by mouth 3 (three) times daily as needed. Takes for muscle spasms   Yes Historical Provider, MD  diclofenac sodium (VOLTAREN) 1 % GEL Apply 2 g topically 4 (four) times daily.   Yes Historical Provider, MD  furosemide (LASIX) 40 MG tablet Take 1 tablet (40 mg total) by mouth daily. 05/09/12  Yes Verlee Monte, MD  hydrALAZINE (APRESOLINE) 25 MG tablet Take 100 mg by mouth daily.   Yes Historical Provider, MD  montelukast (SINGULAIR) 10 MG tablet Take 10 mg by mouth at bedtime.   Yes Historical Provider, MD  OMEPRAZOLE PO Take 40 mg by mouth daily.    Yes Historical Provider, MD  oxyCODONE-acetaminophen (PERCOCET) 5-325 MG per tablet Take 1 tablet by mouth every 4 (four) hours as needed for moderate pain. 10/13/33  Yes [name], MD  oxymorphone (  OPANA ER) 20 MG 12 hr tablet Take 20 mg by mouth every 12 (twelve) hours.   Yes Historical Provider, MD  pregabalin (LYRICA) 75 MG capsule Take 75 mg by mouth 3 (three) times daily.   Yes Historical Provider, MD  ALPRAZolam Duanne Moron) 0.5 MG tablet Take 0.5 mg by mouth at bedtime as needed. Takes for depression    Historical Provider, MD  guaiFENesin-dextromethorphan (ROBITUSSIN DM) 100-10 MG/5ML syrup Take 5 mLs by mouth every 4 (four) hours as needed for cough. 09/20/15   Clayton Bibles, PA-C  ipratropium (ATROVENT) 0.06 % nasal spray Place 2 sprays into the nose 4 (four) times daily. 03/04/13   Billy Fischer, MD  LISINOPRIL PO Take 40 mg by mouth daily.     Historical Provider, MD  predniSONE (STERAPRED UNI-PAK 21 TAB) 10 MG  (21) TBPK tablet Take 1 tablet (10 mg total) by mouth daily. Day 1: take 6 tabs.  Day 2: 5 tabs  Day 3: 4 tabs  Day 4: 3 tabs  Day 5: 2 tabs  Day 6: 1 tab 09/20/15   Clayton Bibles, PA-C    Family History No family history on file.  Social History Social History  Substance Use Topics  . Smoking status: Former Research scientist (life sciences)  . Smokeless tobacco: Never Used     Comment: quit 2004  . Alcohol use No     Allergies   Clindamycin/lincomycin; Ibuprofen; Tomato; and Vicodin [hydrocodone-acetaminophen]   Review of Systems Review of Systems  Constitutional: Negative for fever.  Musculoskeletal: Positive for myalgias.  Skin: Positive for color change.   Physical Exam Updated Vital Signs BP 123/87 (BP Location: Right Arm)   Pulse 84   Temp 97.7 F (36.5 C) (Oral)   Resp 20   Ht 5' (1.524 m)   Wt 289 lb (131.1 kg)   SpO2 100%   BMI 56.44 kg/m   Physical Exam  Constitutional: She is oriented to person, place, and time. She appears well-developed and well-nourished. No distress.  HENT:  Head: Normocephalic and atraumatic.  Eyes: EOM are normal.  Neck: Normal range of motion.  Cardiovascular: Normal rate, regular rhythm and normal heart sounds.   Pulmonary/Chest: Effort normal and breath sounds normal.  Abdominal: Soft. She exhibits no distension. There is no tenderness.  Musculoskeletal: Normal range of motion.  Mild swelling and tenderness on the expected course of brachioradialis No redness or increased warmth  NVI  Neurological: She is alert and oriented to person, place, and time.  Skin: Skin is warm and dry.  Psychiatric: She has a normal mood and affect. Judgment normal.  Nursing note and vitals reviewed.  ED Treatments / Results  DIAGNOSTIC STUDIES:  Oxygen Saturation is 100% on RA, normal by my interpretation.    COORDINATION OF CARE:  11:11 AM Discussed treatment plan with pt at bedside and pt agreed to plan.  Labs (all labs ordered are listed, but only abnormal  results are displayed) Labs Reviewed - No data to display  EKG  EKG Interpretation None       Radiology No results found.  Procedures Procedures (including critical care time)  Medications Ordered in ED Medications - No data to display   Initial Impression / Assessment and Plan / ED Course  I have reviewed the triage vital signs and the nursing notes.  Pertinent labs & imaging results that were available during my care of the patient were reviewed by me and considered in my medical decision making (see chart for details).  46yF with pain, minimal swelling along L brachioradialis. Doubt DVT. Consider cellulitis and will cover for it, although my suspicion for this is pretty low too.  Final Clinical Impressions(s) / ED Diagnoses   Final diagnoses:  Left arm pain    New Prescriptions New Prescriptions   No medications on file   I personally preformed the services scribed in my presence. The recorded information has been reviewed is accurate. Virgel Manifold, MD.     Virgel Manifold, MD 08/04/16 [phone]

## 2016-08-05 DIAGNOSIS — M069 Rheumatoid arthritis, unspecified: Secondary | ICD-10-CM | POA: Insufficient documentation

## 2016-08-05 DIAGNOSIS — Z6841 Body Mass Index (BMI) 40.0 and over, adult: Secondary | ICD-10-CM

## 2016-12-20 ENCOUNTER — Emergency Department (HOSPITAL_BASED_OUTPATIENT_CLINIC_OR_DEPARTMENT_OTHER)
Admission: EM | Admit: 2016-12-20 | Discharge: 2016-12-20 | Disposition: A | Discharge status: Home / Self Care | Payer: Medicaid Other | Attending: Emergency Medicine | Admitting: Emergency Medicine

## 2016-12-20 ENCOUNTER — Encounter (HOSPITAL_BASED_OUTPATIENT_CLINIC_OR_DEPARTMENT_OTHER): Payer: Self-pay | Admitting: *Deleted

## 2016-12-20 DIAGNOSIS — J45909 Unspecified asthma, uncomplicated: Secondary | ICD-10-CM | POA: Diagnosis not present

## 2016-12-20 DIAGNOSIS — Z87891 Personal history of nicotine dependence: Secondary | ICD-10-CM | POA: Diagnosis not present

## 2016-12-20 DIAGNOSIS — Z79899 Other long term (current) drug therapy: Secondary | ICD-10-CM | POA: Insufficient documentation

## 2016-12-20 DIAGNOSIS — I10 Essential (primary) hypertension: Secondary | ICD-10-CM | POA: Insufficient documentation

## 2016-12-20 DIAGNOSIS — T63441A Toxic effect of venom of bees, accidental (unintentional), initial encounter: Secondary | ICD-10-CM | POA: Insufficient documentation

## 2016-12-20 DIAGNOSIS — R220 Localized swelling, mass and lump, head: Secondary | ICD-10-CM | POA: Diagnosis present

## 2016-12-20 DIAGNOSIS — Z23 Encounter for immunization: Secondary | ICD-10-CM | POA: Diagnosis not present

## 2016-12-20 DIAGNOSIS — Y929 Unspecified place or not applicable: Secondary | ICD-10-CM | POA: Insufficient documentation

## 2016-12-20 MED ORDER — EPINEPHRINE 0.3 MG/0.3ML IJ SOAJ: 0.3000 mg | Freq: Once | INTRAMUSCULAR | 0 refills | Status: AC

## 2016-12-20 MED ORDER — PREDNISONE 50 MG PO TABS
60.0000 mg | ORAL_TABLET | Freq: Once | ORAL | Status: AC
  Administered 2016-12-20: 60 mg via ORAL
  Filled 2016-12-20: qty 1

## 2016-12-20 MED ORDER — TETANUS-DIPHTH-ACELL PERTUSSIS 5-2.5-18.5 LF-MCG/0.5 IM SUSP
0.5000 mL | Freq: Once | INTRAMUSCULAR | Status: AC
  Administered 2016-12-20: 0.5 mL via INTRAMUSCULAR
  Filled 2016-12-20: qty 0.5

## 2016-12-20 MED ORDER — PREDNISONE 20 MG PO TABS: ORAL_TABLET | ORAL | 0 refills | Status: DC

## 2016-12-20 MED FILL — EPINEPHRINE 0.3 MG AUTO-INJ: 0.3 | 30 days supply | Qty: 2 | Fill #0

## 2016-12-20 MED FILL — predniSONE 20 MG TABS: 20 | 4 days supply | Qty: 8 | Fill #0

## 2016-12-20 NOTE — ED Provider Notes (Signed)
[facility] Provider Note   CSN: [id number] Arrival date & time: 12/20/16  1508     History   Chief Complaint Chief Complaint  Patient presents with  . Insect Bite    HPI Felicia Molina is a 46 y.o. female.  46 yo F with a chief complaint of of a painful area to her back. Patient states that she was driving her car and suddenly felt a sharp stinging. After which there was some itching and swelling. She thinks that she is allergic to bee stings. She denies shortness of breath nausea or vomiting or feeling like she would pass out. She took some Benadryl with some significant improvement of her symptoms. She went to another doctor's appointment and they suggested that she come here for evaluation.   The history is provided by the patient.  Illness  This is a new problem. The current episode started 3 to 5 hours ago. The problem occurs rarely. The problem has been resolved. Pertinent negatives include no chest pain, no headaches and no shortness of breath. Nothing aggravates the symptoms. Nothing relieves the symptoms. She has tried nothing for the symptoms. The treatment provided no relief.    Past Medical History:  Diagnosis Date  . Asthma   . Back pain, chronic   . Fibromyalgia   . Hypertension   . Morbid obesity ([place])   . Neurogenic bladder   . Panic attacks   . Reflux     Patient Active Problem List   Diagnosis Date Noted  . Dyspnea 06/04/2014  . Plantar fasciitis, bilateral 07/19/2012  . Metatarsalgia of both feet 07/19/2012  . Acute respiratory failure ([place]) 05/07/2012  . Hypertension   . CAP (community acquired pneumonia) 05/06/2012  . Influenza-like illness 05/06/2012    Past Surgical History:  Procedure Laterality Date  . BACK SURGERY    . bladder stimulator    . CARPAL TUNNEL RELEASE    . TONSILLECTOMY    . URETERAL STENT PLACEMENT  2008    OB History    Gravida Para Term Preterm AB Living   6 4 4  0 2 4   SAB TAB Ectopic Multiple Live  Births   2 0 0 0 4       Home Medications    Prior to Admission medications   Medication Sig Start Date End Date Taking? Authorizing Provider  albuterol (PROVENTIL HFA;VENTOLIN HFA) 108 (90 BASE) MCG/ACT inhaler Inhale 2 puffs into the lungs every 6 (six) hours as needed. Takes for shortness of breath    [provider]  ALPRAZolam (XANAX) 0.5 MG tablet Take 0.5 mg by mouth at bedtime as needed. Takes for depression    [provider]  amLODipine (NORVASC) 10 MG tablet Take 15 mg by mouth daily.    [provider]  atenolol (TENORMIN) 25 MG tablet Take 25 mg by mouth daily.    [provider]  budesonide-formoterol (SYMBICORT) 160-4.5 MCG/ACT inhaler Inhale 2 puffs into the lungs 2 (two) times daily.    [provider]  cephALEXin (KEFLEX) 500 MG capsule Take 1 capsule (500 mg total) by mouth 4 (four) times daily. 08/01/16   Virgel Manifold, MD  cyclobenzaprine (FLEXERIL) 10 MG tablet Take 10 mg by mouth 3 (three) times daily as needed. Takes for muscle spasms    [provider]  diclofenac sodium (VOLTAREN) 1 % GEL Apply 2 g topically 4 (four) times daily.    [provider]  EPINEPHrine 0.3 mg/0.3 mL IJ SOAJ  injection Inject 0.3 mLs (0.3 mg total) into the muscle once. 12/20/16 12/20/16  Deno Etienne, DO  furosemide (LASIX) 40 MG tablet Take 1 tablet (40 mg total) by mouth daily. 05/09/12   Verlee Monte, MD  guaiFENesin-dextromethorphan (ROBITUSSIN DM) 100-10 MG/5ML syrup Take 5 mLs by mouth every 4 (four) hours as needed for cough. 09/20/15   Clayton Bibles, PA-C  hydrALAZINE (APRESOLINE) 25 MG tablet Take 100 mg by mouth daily.    [provider]  ipratropium (ATROVENT) 0.06 % nasal spray Place 2 sprays into the nose 4 (four) times daily. 03/04/13   Billy Fischer, MD  LISINOPRIL PO Take 40 mg by mouth daily.     [provider]  montelukast (SINGULAIR) 10 MG tablet Take 10 mg by mouth at bedtime.    [provider]  OMEPRAZOLE PO Take 40 mg by mouth daily.     [provider]  oxyCODONE-acetaminophen (PERCOCET) 5-325 MG per tablet Take 1 tablet by mouth every 4 (four) hours as needed for moderate pain. 10/21/29   [name], MD  oxymorphone (OPANA ER) 20 MG 12 hr tablet Take 20 mg by mouth every 12 (twelve) hours.    [provider]  predniSONE (DELTASONE) 20 MG tablet 2 tabs po daily x 4 days 12/20/16   Deno Etienne, DO  pregabalin (LYRICA) 75 MG capsule Take 75 mg by mouth 3 (three) times daily.    [provider]    Family History No family history on file.  Social History Social History  Substance Use Topics  . Smoking status: Former Research scientist (life sciences)  . Smokeless tobacco: Never Used     Comment: quit 2004  . Alcohol use No     Allergies   Clindamycin/lincomycin; Ibuprofen; Tomato; and Vicodin [hydrocodone-acetaminophen]   Review of Systems Review of Systems  Constitutional: Negative for chills and fever.  HENT: Negative for congestion and rhinorrhea.   Eyes: Negative for redness and visual disturbance.  Respiratory: Negative for shortness of breath and wheezing.   Cardiovascular: Negative for chest pain and palpitations.  Gastrointestinal: Negative for nausea and vomiting.  Genitourinary: Negative for dysuria and urgency.  Musculoskeletal: Negative for arthralgias and myalgias.  Skin: Positive for color change. Negative for pallor and wound.  Neurological: Negative for dizziness and headaches.     Physical Exam Updated Vital Signs BP (!) 137/92   Pulse 88   Temp 98.8 F (37.1 C) (Oral)   Resp 18   Ht 5' (1.524 m)   Wt 129.7 kg (286 lb)   SpO2 98%   BMI 55.86 kg/m   Physical Exam  Constitutional: She is oriented to person, place, and time. She appears well-developed and well-nourished. No distress.  HENT:  Head: Normocephalic and atraumatic.  Eyes: Pupils are equal, round, and reactive to light. EOM are normal.  Neck: Normal range of  motion. Neck supple.  Cardiovascular: Normal rate and regular rhythm.  Exam reveals no gallop and no friction rub.   No murmur heard. Pulmonary/Chest: Effort normal. She has no wheezes. She has no rales.  Abdominal: Soft. She exhibits no distension and no mass. There is no tenderness. There is no guarding.  Musculoskeletal: She exhibits edema. She exhibits no tenderness.  Small localized lesion to back about t3 just to the right.  Mild erythema.  No noted fb.   Neurological: She is alert and oriented to person, place, and time.  Skin: Skin is warm and dry. She is not diaphoretic.  Psychiatric: She has a  normal mood and affect. Her behavior is normal.  Nursing note and vitals reviewed.    ED Treatments / Results  Labs (all labs ordered are listed, but only abnormal results are displayed) Labs Reviewed - No data to display  EKG  EKG Interpretation None       Radiology No results found.  Procedures Procedures (including critical care time)  Medications Ordered in ED Medications  predniSONE (DELTASONE) tablet 60 mg (not administered)  Tdap (BOOSTRIX) injection 0.5 mL (not administered)     Initial Impression / Assessment and Plan / ED Course  I have reviewed the triage vital signs and the nursing notes.  Pertinent labs & imaging results that were available during my care of the patient were reviewed by me and considered in my medical decision making (see chart for details).     46 yo F With a chief complaint of likely a bee sting based on history. There is no foreign body. Will update tetanus. Patient states that she has a history of diffuse hives with this. Will give a prescription for an EpiPen. Ever take Benadryl. Burst steroids.Marland Kitchen  PCP follow up.   3:58 PM:  I have discussed the diagnosis/risks/treatment options with the patient and family and believe the pt to be eligible for discharge home to follow-up with PCP. We also discussed returning to the ED immediately if new  or worsening sx occur. We discussed the sx which are most concerning (e.g., sudden worsening pain, fever, inability to tolerate by mout) that necessitate immediate return. Medications administered to the patient during their visit and any new prescriptions provided to the patient are listed below.  Medications given during this visit Medications  predniSONE (DELTASONE) tablet 60 mg (not administered)  Tdap (BOOSTRIX) injection 0.5 mL (not administered)     The patient appears reasonably screen and/or stabilized for discharge and I doubt any other medical condition or other [facility] requiring further screening, evaluation, or treatment in the ED at this time prior to discharge.    Final Clinical Impressions(s) / ED Diagnoses   Final diagnoses:  Bee sting, accidental or unintentional, initial encounter    New Prescriptions New Prescriptions   EPINEPHRINE 0.3 MG/0.3 ML IJ SOAJ INJECTION    Inject 0.3 mLs (0.3 mg total) into the muscle once.   PREDNISONE (DELTASONE) 20 MG TABLET    2 tabs po daily x 4 days     Deno Etienne, DO 12/20/16 1558

## 2016-12-20 NOTE — Discharge Instructions (Signed)
Benadryl for itching.  Return for trouble breathing, vomiting, diarrhea, or if you feel like you are going to pass out.

## 2016-12-20 NOTE — ED Triage Notes (Signed)
Possible insect bite to her upper back today. Itching.

## 2017-01-07 ENCOUNTER — Emergency Department (HOSPITAL_BASED_OUTPATIENT_CLINIC_OR_DEPARTMENT_OTHER)
Admission: EM | Admit: 2017-01-07 | Discharge: 2017-01-07 | Disposition: A | Discharge status: Home / Self Care | Payer: Medicaid Other | Attending: Emergency Medicine | Admitting: Emergency Medicine

## 2017-01-07 ENCOUNTER — Encounter (HOSPITAL_BASED_OUTPATIENT_CLINIC_OR_DEPARTMENT_OTHER): Payer: Self-pay | Admitting: Emergency Medicine

## 2017-01-07 DIAGNOSIS — B9789 Other viral agents as the cause of diseases classified elsewhere: Secondary | ICD-10-CM

## 2017-01-07 DIAGNOSIS — Z87891 Personal history of nicotine dependence: Secondary | ICD-10-CM | POA: Insufficient documentation

## 2017-01-07 DIAGNOSIS — R05 Cough: Secondary | ICD-10-CM | POA: Diagnosis present

## 2017-01-07 DIAGNOSIS — J45909 Unspecified asthma, uncomplicated: Secondary | ICD-10-CM | POA: Insufficient documentation

## 2017-01-07 DIAGNOSIS — J069 Acute upper respiratory infection, unspecified: Secondary | ICD-10-CM | POA: Insufficient documentation

## 2017-01-07 DIAGNOSIS — I10 Essential (primary) hypertension: Secondary | ICD-10-CM | POA: Insufficient documentation

## 2017-01-07 DIAGNOSIS — Z79899 Other long term (current) drug therapy: Secondary | ICD-10-CM | POA: Diagnosis not present

## 2017-01-07 HISTORY — DX: Encounter for other administrative examinations: Z02.89

## 2017-01-07 NOTE — ED Provider Notes (Signed)
[facility] Provider Note   CSN: [id number] Arrival date & time: 01/07/17  1324     History   Chief Complaint Chief Complaint  Patient presents with  . Cough    HPI Felicia Molina is a 46 y.o. female.  HPI   46 year old morbidly obese female with history of asthma, fibromyalgia and chronic back pain presenting today with URI symptoms. She is accompanied by her grandchildren with similar sxs.  2 days she has had sinus congestion, nonproductive cough, occasional wheezing, chest tightness, and body aches. She does have an albuterol inhaler at home that she use which wasn't relieved. She is using the counter medication. She denies any significant fever, severe headache, productive cough, hemoptysis, nausea vomiting diarrhea or rash. Her grandchildren's here with similar symptoms. Everyone is up-to-date with immunization.  Past Medical History:  Diagnosis Date  . Asthma   . Back pain, chronic   . Fibromyalgia   . Hypertension   . Morbid obesity ([place])   . Neurogenic bladder   . Pain management contract signed   . Panic attacks   . Reflux     Patient Active Problem List   Diagnosis Date Noted  . Dyspnea 06/04/2014  . Plantar fasciitis, bilateral 07/19/2012  . Metatarsalgia of both feet 07/19/2012  . Acute respiratory failure ([place]) 05/07/2012  . Hypertension   . CAP (community acquired pneumonia) 05/06/2012  . Influenza-like illness 05/06/2012    Past Surgical History:  Procedure Laterality Date  . BACK SURGERY    . bladder stimulator    . CARPAL TUNNEL RELEASE    . TONSILLECTOMY    . URETERAL STENT PLACEMENT  2008    OB History    Gravida Para Term Preterm AB Living   6 4 4  0 2 4   SAB TAB Ectopic Multiple Live Births   2 0 0 0 4       Home Medications    Prior to Admission medications   Medication Sig Start Date End Date Taking? Authorizing Provider  albuterol (PROVENTIL HFA;VENTOLIN HFA) 108 (90 BASE) MCG/ACT inhaler Inhale 2 puffs into the  lungs every 6 (six) hours as needed. Takes for shortness of breath    [provider]  ALPRAZolam (XANAX) 0.5 MG tablet Take 0.5 mg by mouth at bedtime as needed. Takes for depression    [provider]  amLODipine (NORVASC) 10 MG tablet Take 15 mg by mouth daily.    [provider]  atenolol (TENORMIN) 25 MG tablet Take 25 mg by mouth daily.    [provider]  budesonide-formoterol (SYMBICORT) 160-4.5 MCG/ACT inhaler Inhale 2 puffs into the lungs 2 (two) times daily.    [provider]  cyclobenzaprine (FLEXERIL) 10 MG tablet Take 10 mg by mouth 3 (three) times daily as needed. Takes for muscle spasms    [provider]  diclofenac sodium (VOLTAREN) 1 % GEL Apply 2 g topically 4 (four) times daily.    [provider]  furosemide (LASIX) 40 MG tablet Take 1 tablet (40 mg total) by mouth daily. 05/09/12   Verlee Monte, MD  hydrALAZINE (APRESOLINE) 25 MG tablet Take 100 mg by mouth daily.    [provider]  ipratropium (ATROVENT) 0.06 % nasal spray Place 2 sprays into the nose 4 (four) times daily. 03/04/13   Billy Fischer, MD  LISINOPRIL PO Take 40 mg by mouth daily.     [provider]  montelukast (SINGULAIR) 10 MG tablet Take 10 mg by  mouth at bedtime.    [provider]  OMEPRAZOLE PO Take 40 mg by mouth daily.     [provider]  oxyCODONE-acetaminophen (PERCOCET) 5-325 MG per tablet Take 1 tablet by mouth every 4 (four) hours as needed for moderate pain. 7/61/95   [name], MD  oxymorphone (OPANA ER) 20 MG 12 hr tablet Take 20 mg by mouth every 12 (twelve) hours.    [provider]  pregabalin (LYRICA) 75 MG capsule Take 75 mg by mouth 3 (three) times daily.    [provider]    Family History No family history on file.  Social History Social History  Substance Use Topics  . Smoking status: Former Research scientist (life sciences)  . Smokeless tobacco: Never Used     Comment: quit 2004  .  Alcohol use No     Allergies   Clindamycin/lincomycin; Ibuprofen; Tomato; and Vicodin [hydrocodone-acetaminophen]   Review of Systems Review of Systems  All other systems reviewed and are negative.    Physical Exam Updated Vital Signs BP (!) 151/95   Pulse 98   Temp 98.8 F (37.1 C)   Resp 18   Ht 5' (1.524 m)   Wt 129.7 kg (286 lb)   SpO2 100%   BMI 55.86 kg/m   Physical Exam  Constitutional: She appears well-developed and well-nourished. No distress.  HENT:  Head: Atraumatic.  Right Ear: External ear normal.  Left Ear: External ear normal.  Nose: Nose normal.  Mouth/Throat: Oropharynx is clear and moist.  Eyes: Conjunctivae are normal.  Neck: Normal range of motion. Neck supple.  No nuchal rigidity  Cardiovascular: Normal rate and regular rhythm.   Pulmonary/Chest: Effort normal and breath sounds normal. No respiratory distress. She has no wheezes.  Abdominal: Soft.  Neurological: She is alert.  Skin: No rash noted.  Psychiatric: She has a normal mood and affect.  Nursing note and vitals reviewed.    ED Treatments / Results  Labs (all labs ordered are listed, but only abnormal results are displayed) Labs Reviewed - No data to display  EKG  EKG Interpretation None       Radiology No results found.  Procedures Procedures (including critical care time)  Medications Ordered in ED Medications - No data to display   Initial Impression / Assessment and Plan / ED Course  I have reviewed the triage vital signs and the nursing notes.  Pertinent labs & imaging results that were available during my care of the patient were reviewed by me and considered in my medical decision making (see chart for details).     BP (!) 151/95   Pulse 98   Temp 98.8 F (37.1 C)   Resp 18   Ht 5' (1.524 m)   Wt 129.7 kg (286 lb)   SpO2 100%   BMI 55.86 kg/m    Final Clinical Impressions(s) / ED Diagnoses   Final diagnoses:  Viral URI with cough    New  Prescriptions New Prescriptions   No medications on file   Pt symptoms consistent with URI. Marland Kitchen Pt will be discharged with symptomatic treatment.  Discussed return precautions.  Pt is hemodynamically stable & in NAD prior to discharge.     Domenic Moras, PA-C 01/07/17 1040    Lajean Saver, MD 01/07/17 [phone]

## 2017-01-07 NOTE — ED Triage Notes (Signed)
Pt having runny nose, cough, exacerbation of asthma x 2 days.  Pt states her back aches and chest tight from coughing.

## 2017-02-04 ENCOUNTER — Ambulatory Visit: Payer: Self-pay | Admitting: Allergy

## 2017-02-14 ENCOUNTER — Ambulatory Visit: Payer: Self-pay | Admitting: Allergy & Immunology

## 2017-03-23 ENCOUNTER — Ambulatory Visit: Payer: Medicaid Other | Admitting: Allergy & Immunology

## 2017-03-24 ENCOUNTER — Ambulatory Visit: Payer: Self-pay | Admitting: Allergy

## 2017-05-04 ENCOUNTER — Ambulatory Visit: Payer: Medicaid Other | Admitting: Allergy & Immunology

## 2017-09-03 ENCOUNTER — Emergency Department (HOSPITAL_BASED_OUTPATIENT_CLINIC_OR_DEPARTMENT_OTHER): Payer: Medicaid Other

## 2017-09-03 ENCOUNTER — Emergency Department (HOSPITAL_BASED_OUTPATIENT_CLINIC_OR_DEPARTMENT_OTHER)
Admission: EM | Admit: 2017-09-03 | Discharge: 2017-09-03 | Disposition: A | Discharge status: Home / Self Care | Payer: Medicaid Other | Attending: Emergency Medicine | Admitting: Emergency Medicine

## 2017-09-03 ENCOUNTER — Encounter (HOSPITAL_BASED_OUTPATIENT_CLINIC_OR_DEPARTMENT_OTHER): Payer: Self-pay | Admitting: Adult Health

## 2017-09-03 ENCOUNTER — Other Ambulatory Visit: Payer: Self-pay

## 2017-09-03 DIAGNOSIS — J45909 Unspecified asthma, uncomplicated: Secondary | ICD-10-CM | POA: Insufficient documentation

## 2017-09-03 DIAGNOSIS — Z87891 Personal history of nicotine dependence: Secondary | ICD-10-CM | POA: Insufficient documentation

## 2017-09-03 DIAGNOSIS — I10 Essential (primary) hypertension: Secondary | ICD-10-CM | POA: Diagnosis not present

## 2017-09-03 DIAGNOSIS — Z79899 Other long term (current) drug therapy: Secondary | ICD-10-CM | POA: Insufficient documentation

## 2017-09-03 DIAGNOSIS — G8929 Other chronic pain: Secondary | ICD-10-CM | POA: Diagnosis not present

## 2017-09-03 DIAGNOSIS — R109 Unspecified abdominal pain: Secondary | ICD-10-CM | POA: Diagnosis present

## 2017-09-03 DIAGNOSIS — E876 Hypokalemia: Secondary | ICD-10-CM

## 2017-09-03 DIAGNOSIS — D259 Leiomyoma of uterus, unspecified: Secondary | ICD-10-CM | POA: Insufficient documentation

## 2017-09-03 DIAGNOSIS — D219 Benign neoplasm of connective and other soft tissue, unspecified: Secondary | ICD-10-CM

## 2017-09-03 LAB — CBC WITH DIFFERENTIAL/PLATELET
Basophils Absolute: 0 10*3/uL (ref 0.0–0.1)
Basophils Relative: 0 %
Eosinophils Absolute: 0.1 10*3/uL (ref 0.0–0.7)
Eosinophils Relative: 1 %
HEMATOCRIT: 35.2 % — AB (ref 36.0–46.0)
HEMOGLOBIN: 11.8 g/dL — AB (ref 12.0–15.0)
LYMPHS ABS: 4.3 10*3/uL — AB (ref 0.7–4.0)
Lymphocytes Relative: 30 %
MCH: 29.9 pg (ref 26.0–34.0)
MCHC: 33.5 g/dL (ref 30.0–36.0)
MCV: 89.3 fL (ref 78.0–100.0)
MONOS PCT: 6 %
Monocytes Absolute: 0.9 10*3/uL (ref 0.1–1.0)
NEUTROS ABS: 9 10*3/uL — AB (ref 1.7–7.7)
NEUTROS PCT: 63 %
Platelets: 408 10*3/uL — ABNORMAL HIGH (ref 150–400)
RBC: 3.94 MIL/uL (ref 3.87–5.11)
RDW: 14.4 % (ref 11.5–15.5)
WBC: 14.3 10*3/uL — ABNORMAL HIGH (ref 4.0–10.5)

## 2017-09-03 LAB — LIPASE, BLOOD: Lipase: 35 U/L (ref 11–51)

## 2017-09-03 LAB — COMPREHENSIVE METABOLIC PANEL
ALK PHOS: 56 U/L (ref 38–126)
ALT: 22 U/L (ref 14–54)
ANION GAP: 12 (ref 5–15)
AST: 20 U/L (ref 15–41)
Albumin: 4 g/dL (ref 3.5–5.0)
BILIRUBIN TOTAL: 0.3 mg/dL (ref 0.3–1.2)
BUN: 17 mg/dL (ref 6–20)
CALCIUM: 9.1 mg/dL (ref 8.9–10.3)
CO2: 25 mmol/L (ref 22–32)
Chloride: 102 mmol/L (ref 101–111)
Creatinine, Ser: 1.16 mg/dL — ABNORMAL HIGH (ref 0.44–1.00)
GFR calc non Af Amer: 55 mL/min — ABNORMAL LOW (ref >60)
Glucose, Bld: 93 mg/dL (ref 65–99)
Potassium: 2.9 mmol/L — ABNORMAL LOW (ref 3.5–5.1)
Sodium: 139 mmol/L (ref 135–145)
TOTAL PROTEIN: 8 g/dL (ref 6.5–8.1)

## 2017-09-03 LAB — WET PREP, GENITAL
Clue Cells Wet Prep HPF POC: NONE SEEN
Sperm: NONE SEEN
TRICH WET PREP: NONE SEEN
Yeast Wet Prep HPF POC: NONE SEEN

## 2017-09-03 LAB — URINALYSIS, ROUTINE W REFLEX MICROSCOPIC
BILIRUBIN URINE: NEGATIVE
GLUCOSE, UA: NEGATIVE mg/dL
Hgb urine dipstick: NEGATIVE
KETONES UR: NEGATIVE mg/dL
Leukocytes, UA: NEGATIVE
NITRITE: NEGATIVE
PH: 5.5 (ref 5.0–8.0)
PROTEIN: NEGATIVE mg/dL
Specific Gravity, Urine: 1.01 (ref 1.005–1.030)

## 2017-09-03 LAB — PREGNANCY, URINE: PREG TEST UR: NEGATIVE

## 2017-09-03 MED ORDER — MORPHINE SULFATE (PF) 4 MG/ML IV SOLN
4.0000 mg | Freq: Once | INTRAVENOUS | Status: AC
  Administered 2017-09-03: 4 mg via INTRAVENOUS
  Filled 2017-09-03: qty 1

## 2017-09-03 MED ORDER — ONDANSETRON HCL 4 MG/2ML IJ SOLN
4.0000 mg | Freq: Once | INTRAMUSCULAR | Status: AC
  Administered 2017-09-03: 4 mg via INTRAVENOUS
  Filled 2017-09-03: qty 2

## 2017-09-03 MED ORDER — POTASSIUM CHLORIDE CRYS ER 20 MEQ PO TBCR
40.0000 meq | EXTENDED_RELEASE_TABLET | Freq: Once | ORAL | Status: AC
  Administered 2017-09-03: 40 meq via ORAL
  Filled 2017-09-03: qty 2

## 2017-09-03 MED ORDER — POTASSIUM CHLORIDE CRYS ER 20 MEQ PO TBCR: 20.0000 meq | EXTENDED_RELEASE_TABLET | Freq: Every day | ORAL | 0 refills | Status: DC

## 2017-09-03 MED ORDER — SODIUM CHLORIDE 0.9 % IV BOLUS
1000.0000 mL | Freq: Once | INTRAVENOUS | Status: AC
  Administered 2017-09-03: 1000 mL via INTRAVENOUS

## 2017-09-03 MED ORDER — NORGESTIM-ETH ESTRAD TRIPHASIC 0.18/0.215/0.25 MG-25 MCG PO TABS: 1.0000 | ORAL_TABLET | Freq: Every day | ORAL | 0 refills | Status: DC

## 2017-09-03 NOTE — ED Notes (Signed)
Morphine dose held until family member arrives to assume care of pt's 47 year old grandson who is here with her

## 2017-09-03 NOTE — ED Notes (Signed)
Pt is now c/o of chest pain that started while she was urinating 3 minutes ago

## 2017-09-03 NOTE — ED Notes (Signed)
Patient transported to CT 

## 2017-09-03 NOTE — Discharge Instructions (Signed)
You have fibroids. Continue your pain meds as prescribed by your doctor.   Take OCP to help with your fibroids.   Your potassium is slightly low and that can cause cramps. Take potassium as prescribed. Recheck potassium level in a week   See your primary care doctor. See GYN doctor for follow up for fibroids   Return to ER if you have worse abdominal pain, flank pain, vomiting, fever

## 2017-09-03 NOTE — ED Notes (Signed)
Patient transported to Ultrasound 

## 2017-09-03 NOTE — ED Provider Notes (Signed)
  Physical Exam  BP 119/71 (BP Location: Right Arm)   Pulse 64   Temp 98.2 F (36.8 C) (Oral)   Resp 16   Ht 5' (1.524 m)   Wt (!) 137 kg (302 lb)   LMP 08/02/2017   SpO2 100%   BMI 58.98 kg/m   Physical Exam  ED Course/Procedures     Procedures  MDM  Care assumed at 3 pm. Patient has lower pelvic pain, L flank pain. Pelvic exam unremarkable per Dr. Marta Antu. CT renal stone showed fibroid. Sign out pending US pelvis.   4:57 PM US showed fibroids, no torsion. K 2.9, supplemented. Has pain contract with her doctor. Will start on OCP, refer to GYN.      Drenda Freeze, MD 09/03/17 [phone]

## 2017-09-03 NOTE — ED Triage Notes (Signed)
Pt presents with multiple complaints and concerns. She states, "I am sweating, I have this rash on my left arm and goes into my chest, I have been putting benadryl cream on it and when the sun hits it it gets worse.. I get it every year about this time and it itches. I am pissin every 5 minutes and my vagina feels like fire and I need an STD test too because my husband he is stepping outside on me. I may be pregnant, I don't know and I took a percocet before I got here because I was hurting so bad in my vagina and in my poo-poo and my back" LMP April sometime. Endroses vaginal discharge.

## 2017-09-03 NOTE — ED Notes (Signed)
Pt ambulated to BR without difficulty. She has her grandchild with her

## 2017-09-03 NOTE — ED Provider Notes (Signed)
[facility] EMERGENCY DEPARTMENT Provider Note   CSN: [id number] Arrival date & time: 09/03/17  1248     History   Chief Complaint Chief Complaint  Patient presents with  . Other    multiple complaints    HPI Felicia Molina is a 47 y.o. female.  Patient is a 47 year old female with multiple medical problems including chronic pain, neurogenic bladder, morbid obesity, hypertension who is presenting today with several complaints.  Patient states 2 days ago she started having a severe pain in her left side that radiates into her back and causes vomiting.  She has had several episodes of vomiting since yesterday but denies any fever.  She has had dysuria, frequency since all this is started.  She denies any vaginal discharge but states she is recently found out that her husband is cheating on her and she is concerned about sexually transmitted infection.  She denies any alcohol use, she is under pain management contract and takes pain medication at home which is not helping.  No prior history of ovarian disease.  But has had a ureteral stent in the past.  Patient denies any shortness of breath or chest pain.  The history is provided by the patient.    Past Medical History:  Diagnosis Date  . Asthma   . Back pain, chronic   . Fibromyalgia   . Hypertension   . Morbid obesity ([place])   . Neurogenic bladder   . Pain management contract signed   . Panic attacks   . Reflux     Patient Active Problem List   Diagnosis Date Noted  . Dyspnea 06/04/2014  . Plantar fasciitis, bilateral 07/19/2012  . Metatarsalgia of both feet 07/19/2012  . Acute respiratory failure ([place]) 05/07/2012  . Hypertension   . CAP (community acquired pneumonia) 05/06/2012  . Influenza-like illness 05/06/2012    Past Surgical History:  Procedure Laterality Date  . BACK SURGERY    . bladder stimulator    . CARPAL TUNNEL RELEASE    . TONSILLECTOMY    . URETERAL STENT PLACEMENT  2008     OB  History    Gravida  6   Para  4   Term  4   Preterm  0   AB  2   Living  4     SAB  2   TAB  0   Ectopic  0   Multiple  0   Live Births  4            Home Medications    Prior to Admission medications   Medication Sig Start Date End Date Taking? Authorizing Provider  albuterol (PROVENTIL HFA;VENTOLIN HFA) 108 (90 BASE) MCG/ACT inhaler Inhale 2 puffs into the lungs every 6 (six) hours as needed. Takes for shortness of breath    [provider]  ALPRAZolam (XANAX) 0.5 MG tablet Take 0.5 mg by mouth at bedtime as needed. Takes for depression    [provider]  amLODipine (NORVASC) 10 MG tablet Take 15 mg by mouth daily.    [provider]  atenolol (TENORMIN) 25 MG tablet Take 25 mg by mouth daily.    [provider]  budesonide-formoterol (SYMBICORT) 160-4.5 MCG/ACT inhaler Inhale 2 puffs into the lungs 2 (two) times daily.    [provider]  cyclobenzaprine (FLEXERIL) 10 MG tablet Take 10 mg by mouth 3 (three) times daily as needed. Takes for muscle spasms    [provider]  diclofenac sodium (  VOLTAREN) 1 % GEL Apply 2 g topically 4 (four) times daily.    [provider]  furosemide (LASIX) 40 MG tablet Take 1 tablet (40 mg total) by mouth daily. 05/09/12   Verlee Monte, MD  hydrALAZINE (APRESOLINE) 25 MG tablet Take 100 mg by mouth daily.    [provider]  ipratropium (ATROVENT) 0.06 % nasal spray Place 2 sprays into the nose 4 (four) times daily. 03/04/13   Billy Fischer, MD  LISINOPRIL PO Take 40 mg by mouth daily.     [provider]  montelukast (SINGULAIR) 10 MG tablet Take 10 mg by mouth at bedtime.    [provider]  OMEPRAZOLE PO Take 40 mg by mouth daily.     [provider]  oxyCODONE-acetaminophen (PERCOCET) 5-325 MG per tablet Take 1 tablet by mouth every 4 (four) hours as needed for moderate pain. 0/34/74   [name], MD  oxymorphone (OPANA ER) 20 MG  12 hr tablet Take 20 mg by mouth every 12 (twelve) hours.    [provider]  pregabalin (LYRICA) 75 MG capsule Take 75 mg by mouth 3 (three) times daily.    [provider]  diphenhydrAMINE (BENADRYL) 25 MG tablet Take 1 tablet (25 mg total) by mouth every 6 (six) hours as needed for itching or allergies. Patient not taking: Reported on 05/25/2014 04/27/14 05/25/14  Waynetta Pean, PA-C    Family History History reviewed. No pertinent family history.  Social History Social History   Tobacco Use  . Smoking status: Former Research scientist (life sciences)  . Smokeless tobacco: Never Used  . Tobacco comment: quit 2004  Substance Use Topics  . Alcohol use: No  . Drug use: No     Allergies   Clindamycin/lincomycin; Ibuprofen; Tomato; and Vicodin [hydrocodone-acetaminophen]   Review of Systems Review of Systems  Skin: Positive for rash.       Has a rash on her left arm and chest that has returned since it started to get warm.  States that she gets it every year.  Trying to take Benadryl and use cream but is not seem to help.  The sun seems to make it worse.  All other systems reviewed and are negative.    Physical Exam Updated Vital Signs BP 100/74 (BP Location: Left Arm)   Pulse 88   Temp 98.2 F (36.8 C) (Oral)   Resp 18   Ht 5' (1.524 m)   Wt (!) 137 kg (302 lb)   SpO2 100%   BMI 58.98 kg/m   Physical Exam  Constitutional: She is oriented to person, place, and time. She appears well-developed and well-nourished. No distress.  HENT:  Head: Normocephalic and atraumatic.  Mouth/Throat: Oropharynx is clear and moist.  Eyes: Pupils are equal, round, and reactive to light. Conjunctivae and EOM are normal.  Neck: Normal range of motion. Neck supple.  Cardiovascular: Normal rate, regular rhythm and intact distal pulses.  No murmur heard. Pulmonary/Chest: Effort normal and breath sounds normal. No respiratory distress. She has no wheezes. She has no rales.  Abdominal: Soft. She  exhibits no distension. There is tenderness in the suprapubic area and left lower quadrant. There is CVA tenderness. There is no rebound and no guarding.  Genitourinary: Vagina normal and uterus normal. There is no rash or tenderness on the right labia. There is no rash or tenderness on the left labia. Cervix exhibits no motion tenderness, no discharge and no friability. Right adnexum displays no mass, no tenderness and no  fullness. Left adnexum displays tenderness. Left adnexum displays no mass and no fullness.  Musculoskeletal: Normal range of motion. She exhibits no edema or tenderness.  Neurological: She is alert and oriented to person, place, and time.  Skin: Skin is warm and dry. Rash noted. No erythema.     Psychiatric: She has a normal mood and affect. Her behavior is normal.  Nursing note and vitals reviewed.    ED Treatments / Results  Labs (all labs ordered are listed, but only abnormal results are displayed) Labs Reviewed  WET PREP, GENITAL - Abnormal; Notable for the following components:      Result Value   WBC, Wet Prep HPF POC FEW (*)    All other components within normal limits  CBC WITH DIFFERENTIAL/PLATELET - Abnormal; Notable for the following components:   WBC 14.3 (*)    Hemoglobin 11.8 (*)    HCT 35.2 (*)    Platelets 408 (*)    Neutro Abs 9.0 (*)    Lymphs Abs 4.3 (*)    All other components within normal limits  COMPREHENSIVE METABOLIC PANEL - Abnormal; Notable for the following components:   Potassium 2.9 (*)    Creatinine, Ser 1.16 (*)    GFR calc non Af Amer 55 (*)    All other components within normal limits  PREGNANCY, URINE  URINALYSIS, ROUTINE W REFLEX MICROSCOPIC  LIPASE, BLOOD  HIV ANTIBODY (ROUTINE TESTING)  RPR  GC/CHLAMYDIA PROBE AMP ([place]) NOT AT [facility]    EKG EKG Interpretation  Date/Time:  Saturday Sep 03 2017 13:19:17 EDT Ventricular Rate:  68 PR Interval:  132 QRS Duration: 88 QT Interval:  446 QTC Calculation: 474 R  Axis:   21 Text Interpretation:  Normal sinus rhythm Normal ECG No significant change since last tracing Confirmed by Blanchie Dessert [phone]) on 09/03/2017 1:26:48 PM   Radiology Ct Renal Stone Study  Result Date: 09/03/2017 CLINICAL DATA:  Hx cystitis, has bladder stimulator, hx renal stones, hematuria, pain x 2 days Negative urine preg today EXAM: CT ABDOMEN AND PELVIS WITHOUT CONTRAST TECHNIQUE: Multidetector CT imaging of the abdomen and pelvis was performed following the standard protocol without IV contrast. COMPARISON:  CT of the abdomen and pelvis on 12/21/2014 FINDINGS: Lower chest: Lung bases are unremarkable. Heart size is normal. No pericardial effusion or significant coronary artery calcifications. External to the patient, along the inframammary fold, there are 3 objects measuring approximately 2.2 x 0.9 centimeters each. These appear symmetric, contain air, and could represent capsules. Hepatobiliary: The liver is diffusely low attenuation. No focal liver abnormality is seen. No radiopaque gallstones, biliary dilatation, or pericholecystic inflammatory changes. Pancreas: Unremarkable. No pancreatic ductal dilatation or surrounding inflammatory changes. Spleen: Normal in size without focal abnormality. Adrenals/Urinary Tract: Adrenal glands are normal in appearance. Kidneys are normal in appearance. No hydronephrosis or ureteral obstruction. Stomach/Bowel: The stomach and small bowel loops are normal in appearance. The appendix is well seen and has a normal appearance. Loops of colon are normal in appearance. Vascular/Lymphatic: No retroperitoneal or mesenteric adenopathy. No evidence for aortic aneurysm. Reproductive: Uterus appears globular. Suspect uterine fibroid or fibroids. No adnexal mass. Other: Previously noted stranding in the retroperitoneum is significantly improved on the current study. There is no free pelvic fluid. Electronic device/stimulator leads to the posterior pelvis.  Musculoskeletal: Posterior fusion L4-S1 with laminectomies at L4 and L5. IMPRESSION: 1. Suspect medication capsules or other objects external to the patient in the inframammary fold region on the LEFT. 2. Hepatic steatosis. 3.  No evidence for acute  abnormality. 4. Normal appendix. 5. Suspect uterine fibroids. 6. Improved infiltration of the retroperitoneal fat. 7. Postoperative changes in the lumbosacral spine. 8. Bladder stimulator. Electronically Signed   By: Nolon Nations M.D.   On: 09/03/2017 15:13    Procedures Procedures (including critical care time)  Medications Ordered in ED Medications  ondansetron (ZOFRAN) injection 4 mg (has no administration in time range)     Initial Impression / Assessment and Plan / ED Course  I have reviewed the triage vital signs and the nursing notes.  Pertinent labs & imaging results that were available during my care of the patient were reviewed by me and considered in my medical decision making (see chart for details).    Patient presenting with several complaints.  Most prominent complaint is of abdominal pain and flank pain that started 2 days ago that causes vomiting.  No infectious symptoms but discomfort with urination.  Symptoms are not improving and she wants to be evaluated.  Also states her husband is cheating on her and she is concerned for sexually transmitted infection but denies any vaginal discharge.  Urine pregnancy test is negative today and UA is negative for any evidence of infection or bleeding.  We will do pelvic exam for further evaluation.  Concern for possible kidney stone versus of the ovarian cyst rupture versus possible torsion.  Patient given IV fluids, nausea and pain medication. Also patient is complaining of a rash in her left arm which she gets yearly.  It is excoriated, raised without signs of infection.  There are no vesicles present.  Most likely contact or allergic in nature.  2:36 PM Exam with pain in the left  adnexal area but no masses palpated.  Patient states pain is worse with palpation of the abdomen and then on pelvic.  She has no discharge or evidence concerning for STI.  Patient's pregnancy test and UA are within normal limits.  Still concern for kidney stone versus ovarian cyst or torsion.  Will start with CT.  Patient is a leukocytosis of 14,000 but normal hemoglobin.  CMP and lipase still pending.  3:35 PM Patient CMP with hypokalemia of 2.9 which we will replace orally.  CT is negative for acute causes of her pain.  We will do an ultrasound to rule out torsion.  Pt care transferred to Dr. Darl Householder.  Final Clinical Impressions(s) / ED Diagnoses   Final diagnoses:  None    ED Discharge Orders    None       Blanchie Dessert, MD 09/03/17 1536

## 2017-09-03 NOTE — ED Notes (Signed)
ED Provider at bedside. 

## 2017-09-04 LAB — RPR: RPR: NONREACTIVE

## 2017-09-04 LAB — HIV ANTIBODY (ROUTINE TESTING W REFLEX): HIV Screen 4th Generation wRfx: NONREACTIVE

## 2017-09-05 LAB — GC/CHLAMYDIA PROBE AMP (~~LOC~~) NOT AT ARMC
CHLAMYDIA, DNA PROBE: NEGATIVE
Neisseria Gonorrhea: NEGATIVE

## 2017-09-09 DIAGNOSIS — D219 Benign neoplasm of connective and other soft tissue, unspecified: Secondary | ICD-10-CM | POA: Insufficient documentation

## 2017-10-14 DIAGNOSIS — F32A Depression, unspecified: Secondary | ICD-10-CM | POA: Insufficient documentation

## 2017-10-14 DIAGNOSIS — F329 Major depressive disorder, single episode, unspecified: Secondary | ICD-10-CM | POA: Insufficient documentation

## 2017-10-14 DIAGNOSIS — J45909 Unspecified asthma, uncomplicated: Secondary | ICD-10-CM | POA: Insufficient documentation

## 2017-10-14 DIAGNOSIS — K219 Gastro-esophageal reflux disease without esophagitis: Secondary | ICD-10-CM | POA: Insufficient documentation

## 2017-10-29 ENCOUNTER — Other Ambulatory Visit: Payer: Self-pay

## 2017-10-29 ENCOUNTER — Encounter (HOSPITAL_BASED_OUTPATIENT_CLINIC_OR_DEPARTMENT_OTHER): Payer: Self-pay | Admitting: Emergency Medicine

## 2017-10-29 ENCOUNTER — Emergency Department (HOSPITAL_BASED_OUTPATIENT_CLINIC_OR_DEPARTMENT_OTHER): Payer: Medicaid Other

## 2017-10-29 ENCOUNTER — Emergency Department (HOSPITAL_BASED_OUTPATIENT_CLINIC_OR_DEPARTMENT_OTHER)
Admission: EM | Admit: 2017-10-29 | Discharge: 2017-10-29 | Disposition: A | Discharge status: Home / Self Care | Payer: Medicaid Other | Attending: Emergency Medicine | Admitting: Emergency Medicine

## 2017-10-29 DIAGNOSIS — Y999 Unspecified external cause status: Secondary | ICD-10-CM | POA: Insufficient documentation

## 2017-10-29 DIAGNOSIS — Z79899 Other long term (current) drug therapy: Secondary | ICD-10-CM | POA: Insufficient documentation

## 2017-10-29 DIAGNOSIS — Y9301 Activity, walking, marching and hiking: Secondary | ICD-10-CM | POA: Diagnosis not present

## 2017-10-29 DIAGNOSIS — Z87891 Personal history of nicotine dependence: Secondary | ICD-10-CM | POA: Insufficient documentation

## 2017-10-29 DIAGNOSIS — X509XXA Other and unspecified overexertion or strenuous movements or postures, initial encounter: Secondary | ICD-10-CM | POA: Diagnosis not present

## 2017-10-29 DIAGNOSIS — I10 Essential (primary) hypertension: Secondary | ICD-10-CM | POA: Diagnosis not present

## 2017-10-29 DIAGNOSIS — J45909 Unspecified asthma, uncomplicated: Secondary | ICD-10-CM | POA: Insufficient documentation

## 2017-10-29 DIAGNOSIS — Y929 Unspecified place or not applicable: Secondary | ICD-10-CM | POA: Insufficient documentation

## 2017-10-29 DIAGNOSIS — M25531 Pain in right wrist: Secondary | ICD-10-CM | POA: Diagnosis present

## 2017-10-29 DIAGNOSIS — S6391XA Sprain of unspecified part of right wrist and hand, initial encounter: Secondary | ICD-10-CM | POA: Diagnosis not present

## 2017-10-29 DIAGNOSIS — S63501A Unspecified sprain of right wrist, initial encounter: Secondary | ICD-10-CM

## 2017-10-29 NOTE — ED Triage Notes (Signed)
R hand and wrist pain x 2 days. States she has been moving and possibly injured it.

## 2017-10-29 NOTE — Discharge Instructions (Signed)
Use ice 3-4 times daily alternating 20 minutes on, 20 minutes off.  Wear your splint to help protect her wrist.  Please follow-up with Dr. Barbaraann Barthel for further evaluation and treatment of your symptoms.  Please return to emergency department if you develop any new or worsening symptoms.

## 2017-10-29 NOTE — ED Provider Notes (Signed)
[facility] EMERGENCY DEPARTMENT Provider Note   CSN: [id number] Arrival date & time: 10/29/17  1334     History   Chief Complaint Chief Complaint  Patient presents with  . Hand Pain    HPI Felicia Molina is a 47 y.o. female with history of chronic back pain with pain management contract, hypertension, fibromyalgia who presents with right wrist pain after moving furniture.  She denies any numbness or tingling.  She notes that she feels like her hand is swollen.  She has been using a wrist splint at home without significant relief.  She has been taking Percocet without relief.  She denies any other injuries.  HPI  Past Medical History:  Diagnosis Date  . Asthma   . Back pain, chronic   . Fibromyalgia   . Hypertension   . Morbid obesity ([place])   . Neurogenic bladder   . Pain management contract signed   . Panic attacks   . Reflux     Patient Active Problem List   Diagnosis Date Noted  . Dyspnea 06/04/2014  . Plantar fasciitis, bilateral 07/19/2012  . Metatarsalgia of both feet 07/19/2012  . Acute respiratory failure ([place]) 05/07/2012  . Hypertension   . CAP (community acquired pneumonia) 05/06/2012  . Influenza-like illness 05/06/2012    Past Surgical History:  Procedure Laterality Date  . BACK SURGERY    . bladder stimulator    . CARPAL TUNNEL RELEASE    . TONSILLECTOMY    . URETERAL STENT PLACEMENT  2008     OB History    Gravida  6   Para  4   Term  4   Preterm  0   AB  2   Living  4     SAB  2   TAB  0   Ectopic  0   Multiple  0   Live Births  4            Home Medications    Prior to Admission medications   Medication Sig Start Date End Date Taking? Authorizing Provider  albuterol (PROVENTIL HFA;VENTOLIN HFA) 108 (90 BASE) MCG/ACT inhaler Inhale 2 puffs into the lungs every 6 (six) hours as needed. Takes for shortness of breath    [provider]  ALPRAZolam (XANAX) 0.5 MG tablet Take 0.5 mg by mouth at  bedtime as needed. Takes for depression    [provider]  amLODipine (NORVASC) 10 MG tablet Take 15 mg by mouth daily.    [provider]  atenolol (TENORMIN) 25 MG tablet Take 25 mg by mouth daily.    [provider]  budesonide-formoterol (SYMBICORT) 160-4.5 MCG/ACT inhaler Inhale 2 puffs into the lungs 2 (two) times daily.    [provider]  cyclobenzaprine (FLEXERIL) 10 MG tablet Take 10 mg by mouth 3 (three) times daily as needed. Takes for muscle spasms    [provider]  diclofenac sodium (VOLTAREN) 1 % GEL Apply 2 g topically 4 (four) times daily.    [provider]  furosemide (LASIX) 40 MG tablet Take 1 tablet (40 mg total) by mouth daily. 05/09/12   Verlee Monte, MD  hydrALAZINE (APRESOLINE) 25 MG tablet Take 100 mg by mouth daily.    [provider]  ipratropium (ATROVENT) 0.06 % nasal spray Place 2 sprays into the nose 4 (four) times daily. 03/04/13   Billy Fischer, MD  LISINOPRIL PO Take 40 mg by mouth daily.     [provider]  montelukast (SINGULAIR) 10 MG tablet Take 10 mg by mouth at bedtime.    [provider]  Norgestimate-Ethinyl Estradiol Triphasic (ORTHO TRI-CYCLEN LO) 0.18/0.215/0.25 MG-25 MCG tab Take 1 tablet by mouth daily. 09/03/17   Drenda Freeze, MD  OMEPRAZOLE PO Take 40 mg by mouth daily.     [provider]  oxyCODONE-acetaminophen (PERCOCET) 5-325 MG per tablet Take 1 tablet by mouth every 4 (four) hours as needed for moderate pain. 5/42/70   [name], MD  oxymorphone (OPANA ER) 20 MG 12 hr tablet Take 20 mg by mouth every 12 (twelve) hours.    [provider]  potassium chloride SA (K-DUR,KLOR-CON) 20 MEQ tablet Take 1 tablet (20 mEq total) by mouth daily. 09/03/17   Drenda Freeze, MD  pregabalin (LYRICA) 75 MG capsule Take 75 mg by mouth 3 (three) times daily.    [provider]    Family History No family history on file.  Social  History Social History   Tobacco Use  . Smoking status: Former Research scientist (life sciences)  . Smokeless tobacco: Never Used  . Tobacco comment: quit 2004  Substance Use Topics  . Alcohol use: No  . Drug use: No     Allergies   Clindamycin/lincomycin; Ibuprofen; Tomato; and Vicodin [hydrocodone-acetaminophen]   Review of Systems Review of Systems  Constitutional: Negative for fever.  Musculoskeletal: Positive for arthralgias.  Neurological: Negative for numbness.     Physical Exam Updated Vital Signs BP 124/86 (BP Location: Left Arm)   Pulse 80   Temp 98.5 F (36.9 C) (Oral)   Resp 20   Ht 5' (1.524 m)   Wt 130.6 kg (288 lb)   SpO2 98%   BMI 56.25 kg/m   Physical Exam  Constitutional: She appears well-developed and well-nourished. No distress.  HENT:  Head: Normocephalic and atraumatic.  Mouth/Throat: Oropharynx is clear and moist. No oropharyngeal exudate.  Eyes: Pupils are equal, round, and reactive to light. Conjunctivae are normal. Right eye exhibits no discharge. Left eye exhibits no discharge. No scleral icterus.  Neck: Normal range of motion.  Cardiovascular: Normal rate, regular rhythm, normal heart sounds and intact distal pulses. Exam reveals no gallop and no friction rub.  No murmur heard. Pulmonary/Chest: Effort normal and breath sounds normal. No stridor. No respiratory distress. She has no wheezes. She has no rales.  Musculoskeletal: She exhibits no edema.  R wrist: Pain to radial and ulnar aspect of the right wrist, no anatomical snuffbox tenderness, range of motion with flexion extension of the wrist is intact as well as full range of motion of the digits, sensation intact, radial pulse intact as well as cap refill less than 2 seconds; pain with Finkelstein's test, some pain with Tinel's test  Neurological: She is alert. Coordination normal.  Skin: Skin is warm and dry. No rash noted. She is not diaphoretic. No pallor.  Psychiatric: She has a normal mood and affect.    Nursing note and vitals reviewed.    ED Treatments / Results  Labs (all labs ordered are listed, but only abnormal results are displayed) Labs Reviewed - No data to display  EKG None  Radiology Dg Wrist Complete Right  Result Date: 10/29/2017 CLINICAL DATA:  Injury with pain EXAM: RIGHT WRIST - COMPLETE 3+ VIEW COMPARISON:  None. FINDINGS: Osseous alignment is normal. Bone mineralization is normal. No fracture line or displaced fracture fragment. Adjacent soft tissues are unremarkable. IMPRESSION: Negative. Electronically Signed   By: Franki Cabot M.D.   On:  10/29/2017 14:12   Dg Hand Complete Right  Result Date: 10/29/2017 CLINICAL DATA:  Pain after moving furniture. EXAM: RIGHT HAND - COMPLETE 3+ VIEW COMPARISON:  None. FINDINGS: There is no evidence of fracture or dislocation. There is no evidence of arthropathy or other focal bone abnormality. Soft tissues are unremarkable. IMPRESSION: Negative. Electronically Signed   By: Franki Cabot M.D.   On: 10/29/2017 14:13    Procedures Procedures (including critical care time)  Medications Ordered in ED Medications - No data to display   Initial Impression / Assessment and Plan / ED Course  I have reviewed the triage vital signs and the nursing notes.  Pertinent labs & imaging results that were available during my care of the patient were reviewed by me and considered in my medical decision making (see chart for details).     Patient with suspected sprain or soft tissue injury of the right wrist.  X-ray of the hand and wrist are negative.  X-rays also reviewed by me.  No anatomical snuffbox tenderness.  Patient advised to use ice and rest the wrist.  Patient splint replaced with a more substantial splint.  Patient referred to sports medicine for further evaluation.  Patient already taking Percocet and reports she is allergic to NSAIDs.  Return precautions discussed.  Patient understands and agrees with plan.  Patient vitals stable  throughout ED course and discharged in satisfactory condition.  Final Clinical Impressions(s) / ED Diagnoses   Final diagnoses:  Sprain of right wrist, initial encounter    ED Discharge Orders    None       Frederica Kuster, PA-C 10/29/17 1612    Fredia Sorrow, MD 11/02/17 [phone]

## 2017-11-07 ENCOUNTER — Ambulatory Visit: Payer: Medicaid Other | Admitting: Family Medicine

## 2017-11-07 ENCOUNTER — Encounter: Payer: Self-pay | Admitting: Family Medicine

## 2017-11-07 DIAGNOSIS — M25531 Pain in right wrist: Secondary | ICD-10-CM | POA: Diagnosis present

## 2017-11-07 NOTE — Patient Instructions (Signed)
You have an ulnar tendinitis. Use the wrist brace regularly. Icing 15 minutes at a time 3-4 times a day. Continue your pain medicine, voltaren gel to this area, flexeril. Add salon pas lidocaine patches under the brace. An ulnar gutter splint, occupational therapy are options if you're struggling. Follow up with me in 1 month.

## 2017-11-08 ENCOUNTER — Encounter: Payer: Self-pay | Admitting: Family Medicine

## 2017-11-08 DIAGNOSIS — M25531 Pain in right wrist: Secondary | ICD-10-CM | POA: Insufficient documentation

## 2017-11-08 NOTE — Assessment & Plan Note (Signed)
independently reviewed radiographs and no bony abnormalities.  Consistent with ulnar tendinitis.  Icing, continue pain medication.  Voltaren gel, flexeril, salon pas patches.  Consider ulnar gutter splint, occupational therapy if not improving.  F/u in 1 month.

## 2017-11-08 NOTE — Progress Notes (Signed)
PCP: Benito Mccreedy, MD  Subjective:   HPI: Patient is a 47 y.o. female here for right wrist pain.  Patient reports about 3 weeks ago she was in the process of moving. So she was carrying a lot of crates. No acute injury but developed right wrist pain up to 7/10 level, sharp on ulnar side of wrist. Pain trying to open fingers, with turning (pronation/supination). Has tried bracing, icing. She takes lyrica, opana, flexeril. No skin changes but feels it was swollen. No numbness.  Past Medical History:  Diagnosis Date  . Asthma   . Back pain, chronic   . Fibromyalgia   . Hypertension   . Morbid obesity ([place])   . Neurogenic bladder   . Pain management contract signed   . Panic attacks   . Reflux     Current Outpatient Medications on File Prior to Visit  Medication Sig Dispense Refill  . atenolol-chlorthalidone (TENORETIC) 100-25 MG tablet Take by mouth.    Marland Kitchen buPROPion (WELLBUTRIN SR) 150 MG 12 hr tablet Take by mouth.    . citalopram (CELEXA) 20 MG tablet Take by mouth.    Marland Kitchen albuterol (PROVENTIL HFA;VENTOLIN HFA) 108 (90 BASE) MCG/ACT inhaler Inhale 2 puffs into the lungs every 6 (six) hours as needed. Takes for shortness of breath    . ALPRAZolam (XANAX) 0.5 MG tablet Take 0.5 mg by mouth at bedtime as needed. Takes for depression    . amLODipine (NORVASC) 10 MG tablet Take 15 mg by mouth daily.    Marland Kitchen atenolol (TENORMIN) 25 MG tablet Take 25 mg by mouth daily.    . budesonide-formoterol (SYMBICORT) 160-4.5 MCG/ACT inhaler Inhale 2 puffs into the lungs 2 (two) times daily.    . cetirizine (ZYRTEC) 10 MG tablet TK 1 T PO DAILY  3  . cyclobenzaprine (FLEXERIL) 10 MG tablet Take 10 mg by mouth 3 (three) times daily as needed. Takes for muscle spasms    . diclofenac sodium (VOLTAREN) 1 % GEL Apply 2 g topically 4 (four) times daily.    . fluticasone (FLONASE) 50 MCG/ACT nasal spray 2 SPRAYS INTO EACH NOSTRIL DAILY  5  . furosemide (LASIX) 40 MG tablet Take 1 tablet (40 mg total)  by mouth daily. 30 tablet 0  . hydrALAZINE (APRESOLINE) 25 MG tablet Take 100 mg by mouth daily.    Marland Kitchen ipratropium (ATROVENT) 0.06 % nasal spray Place 2 sprays into the nose 4 (four) times daily. 15 mL 1  . LISINOPRIL PO Take 40 mg by mouth daily.     Marland Kitchen losartan (COZAAR) 100 MG tablet TK 1 T PO DAILY  3  . montelukast (SINGULAIR) 10 MG tablet Take 10 mg by mouth at bedtime.    . norethindrone (MICRONOR,CAMILA,ERRIN) 0.35 MG tablet   11  . Norgestimate-Ethinyl Estradiol Triphasic (ORTHO TRI-CYCLEN LO) 0.18/0.215/0.25 MG-25 MCG tab Take 1 tablet by mouth daily. 1 Package 0  . OMEPRAZOLE PO Take 40 mg by mouth daily.     Marland Kitchen oxyCODONE-acetaminophen (PERCOCET) 5-325 MG per tablet Take 1 tablet by mouth every 4 (four) hours as needed for moderate pain. 20 tablet 0  . oxymorphone (OPANA ER) 20 MG 12 hr tablet Take 20 mg by mouth every 12 (twelve) hours.    . potassium chloride SA (K-DUR,KLOR-CON) 20 MEQ tablet Take 1 tablet (20 mEq total) by mouth daily. 10 tablet 0  . pregabalin (LYRICA) 75 MG capsule Take 75 mg by mouth 3 (three) times daily.    . rosuvastatin (CRESTOR) 20 MG  tablet TK 1 T PO  Q NIGHT  3   No current facility-administered medications on file prior to visit.     Past Surgical History:  Procedure Laterality Date  . BACK SURGERY    . bladder stimulator    . CARPAL TUNNEL RELEASE    . TONSILLECTOMY    . URETERAL STENT PLACEMENT  2008    Allergies  Allergen Reactions  . Clindamycin/Lincomycin Hives and Swelling  . Ibuprofen Hives    Hives across the face  . Tomato Hives  . Vicodin [Hydrocodone-Acetaminophen] Hives    Social History   Socioeconomic History  . Marital status: Single    Spouse name: Not on file  . Number of children: Not on file  . Years of education: Not on file  . Highest education level: Not on file  Occupational History  . Not on file  Social Needs  . Financial resource strain: Not on file  . Food insecurity:    Worry: Not on file    Inability:  Not on file  . Transportation needs:    Medical: Not on file    Non-medical: Not on file  Tobacco Use  . Smoking status: Former Research scientist (life sciences)  . Smokeless tobacco: Never Used  . Tobacco comment: quit 2004  Substance and Sexual Activity  . Alcohol use: No  . Drug use: No  . Sexual activity: Yes    Birth control/protection: None, Condom  Lifestyle  . Physical activity:    Days per week: Not on file    Minutes per session: Not on file  . Stress: Not on file  Relationships  . Social connections:    Talks on phone: Not on file    Gets together: Not on file    Attends religious service: Not on file    Active member of club or organization: Not on file    Attends meetings of clubs or organizations: Not on file    Relationship status: Not on file  . Intimate partner violence:    Fear of current or ex partner: Not on file    Emotionally abused: Not on file    Physically abused: Not on file    Forced sexual activity: Not on file  Other Topics Concern  . Not on file  Social History Narrative  . Not on file    History reviewed. No pertinent family history.  BP (!) 119/53   Pulse 90   Ht 5' (1.524 m)   Wt 288 lb (130.6 kg)   BMI 56.25 kg/m   Review of Systems: See HPI above.     Objective:  Physical Exam:  Gen: NAD, comfortable in exam room  Right wrist: No deformity, swelling, bruising.  No clicking. FROM with 5/5 strength but pain on ulnar deviation, flexion, and extension on ulnar side of wrist. TTP ulnar side of wrist and distal forearm.  No TFCC tenderness, focal bony tenderness. NVI distally. Negative finkelsteins, tinels.  Left wrist: No deformity. FROM with 5/5 strength. No tenderness to palpation. NVI distally.   Assessment & Plan:  1. Right wrist pain - independently reviewed radiographs and no bony abnormalities.  Consistent with ulnar tendinitis.  Icing, continue pain medication.  Voltaren gel, flexeril, salon pas patches.  Consider ulnar gutter splint,  occupational therapy if not improving.  F/u in 1 month.

## 2017-11-11 ENCOUNTER — Telehealth: Payer: Self-pay | Admitting: Family Medicine

## 2017-11-11 NOTE — Telephone Encounter (Signed)
Order placed and sent for occupational therapy.

## 2017-11-11 NOTE — Telephone Encounter (Signed)
Patient states she is still in a lot of pain. She has tried icing, putting heat on her wrist, the prescribed medications and the lidocaine patches, but is not getting much relief.   Patient would like to go ahead and try the ulnar gutter splint and occupational therapy

## 2017-11-11 NOTE — Addendum Note (Signed)
Addended by: Sherrie George F on: 11/11/2017 10:16 AM   Modules accepted: Orders

## 2017-11-11 NOTE — Telephone Encounter (Signed)
Ok to put in order for occupational therapy.  Please see if she can come in on Monday some time to make her an ulnar gutter splint.  Thanks!

## 2017-11-14 ENCOUNTER — Ambulatory Visit (INDEPENDENT_AMBULATORY_CARE_PROVIDER_SITE_OTHER): Payer: Medicaid Other | Admitting: Family Medicine

## 2017-11-14 ENCOUNTER — Encounter: Payer: Self-pay | Admitting: Family Medicine

## 2017-11-14 VITALS — BP 122/78 | HR 91 | Ht 60.0 in | Wt 288.0 lb

## 2017-11-14 DIAGNOSIS — M25531 Pain in right wrist: Secondary | ICD-10-CM

## 2017-11-15 ENCOUNTER — Encounter: Payer: Self-pay | Admitting: Family Medicine

## 2017-11-15 NOTE — Progress Notes (Signed)
Patient returned today to have ulnar gutter splint placed.

## 2017-12-08 ENCOUNTER — Ambulatory Visit: Payer: Medicaid Other | Admitting: Family Medicine

## 2017-12-16 ENCOUNTER — Ambulatory Visit: Payer: Medicaid Other | Admitting: Family Medicine

## 2017-12-16 ENCOUNTER — Encounter: Payer: Self-pay | Admitting: Family Medicine

## 2017-12-16 VITALS — BP 159/91 | HR 94 | Ht 60.0 in | Wt 288.0 lb

## 2017-12-16 DIAGNOSIS — M25531 Pain in right wrist: Secondary | ICD-10-CM

## 2017-12-16 NOTE — Patient Instructions (Signed)
You have an ulnar tendinitis. We will go ahead with a CT arthrogram of your wrist to assess for a TFCC tear since you can't have an MRI. Use the ulnar gutter splint - take this off for the CT arthrogram though. Icing 15 minutes at a time 3-4 times a day. Continue your pain medicine, voltaren gel to this area, flexeril. Salon pas lidocaine patches under the brace.

## 2017-12-19 ENCOUNTER — Encounter: Payer: Self-pay | Admitting: Family Medicine

## 2017-12-19 NOTE — Progress Notes (Signed)
PCP: Benito Mccreedy, MD  Subjective:   HPI: Patient is a 47 y.o. female here for right wrist pain.  7/15: Patient reports about 3 weeks ago she was in the process of moving. So she was carrying a lot of crates. No acute injury but developed right wrist pain up to 7/10 level, sharp on ulnar side of wrist. Pain trying to open fingers, with turning (pronation/supination). Has tried bracing, icing. She takes lyrica, opana, flexeril. No skin changes but feels it was swollen. No numbness.  8/23: Patient reports she's still having severe pain that has spread throughout wrist. Splint has helped her some (ulnar gutter). Hard for her to make a fist. Pain level 10/10 and sharp. Percocet helps some. No skin changes.  Past Medical History:  Diagnosis Date  . Asthma   . Back pain, chronic   . Fibromyalgia   . Hypertension   . Morbid obesity ([place])   . Neurogenic bladder   . Pain management contract signed   . Panic attacks   . Reflux     Current Outpatient Medications on File Prior to Visit  Medication Sig Dispense Refill  . albuterol (PROVENTIL HFA;VENTOLIN HFA) 108 (90 BASE) MCG/ACT inhaler Inhale 2 puffs into the lungs every 6 (six) hours as needed. Takes for shortness of breath    . ALPRAZolam (XANAX) 0.5 MG tablet Take 0.5 mg by mouth at bedtime as needed. Takes for depression    . amLODipine (NORVASC) 10 MG tablet Take 15 mg by mouth daily.    Marland Kitchen atenolol (TENORMIN) 25 MG tablet Take 25 mg by mouth daily.    Marland Kitchen atenolol-chlorthalidone (TENORETIC) 100-25 MG tablet Take by mouth.    . budesonide-formoterol (SYMBICORT) 160-4.5 MCG/ACT inhaler Inhale 2 puffs into the lungs 2 (two) times daily.    Marland Kitchen buPROPion (WELLBUTRIN SR) 150 MG 12 hr tablet Take by mouth.    . cetirizine (ZYRTEC) 10 MG tablet TK 1 T PO DAILY  3  . citalopram (CELEXA) 20 MG tablet Take by mouth.    . cyclobenzaprine (FLEXERIL) 10 MG tablet Take 10 mg by mouth 3 (three) times daily as needed. Takes for muscle  spasms    . diclofenac sodium (VOLTAREN) 1 % GEL Apply 2 g topically 4 (four) times daily.    . fluticasone (FLONASE) 50 MCG/ACT nasal spray 2 SPRAYS INTO EACH NOSTRIL DAILY  5  . furosemide (LASIX) 40 MG tablet Take 1 tablet (40 mg total) by mouth daily. 30 tablet 0  . hydrALAZINE (APRESOLINE) 25 MG tablet Take 100 mg by mouth daily.    Marland Kitchen ipratropium (ATROVENT) 0.06 % nasal spray Place 2 sprays into the nose 4 (four) times daily. 15 mL 1  . LISINOPRIL PO Take 40 mg by mouth daily.     Marland Kitchen losartan (COZAAR) 100 MG tablet TK 1 T PO DAILY  3  . montelukast (SINGULAIR) 10 MG tablet Take 10 mg by mouth at bedtime.    . norethindrone (MICRONOR,CAMILA,ERRIN) 0.35 MG tablet   11  . Norgestimate-Ethinyl Estradiol Triphasic (ORTHO TRI-CYCLEN LO) 0.18/0.215/0.25 MG-25 MCG tab Take 1 tablet by mouth daily. 1 Package 0  . OMEPRAZOLE PO Take 40 mg by mouth daily.     Marland Kitchen oxyCODONE-acetaminophen (PERCOCET) 5-325 MG per tablet Take 1 tablet by mouth every 4 (four) hours as needed for moderate pain. 20 tablet 0  . oxymorphone (OPANA ER) 20 MG 12 hr tablet Take 20 mg by mouth every 12 (twelve) hours.    . potassium chloride SA (  K-DUR,KLOR-CON) 20 MEQ tablet Take 1 tablet (20 mEq total) by mouth daily. 10 tablet 0  . pregabalin (LYRICA) 75 MG capsule Take 75 mg by mouth 3 (three) times daily.    . rosuvastatin (CRESTOR) 20 MG tablet TK 1 T PO  Q NIGHT  3   No current facility-administered medications on file prior to visit.     Past Surgical History:  Procedure Laterality Date  . BACK SURGERY    . bladder stimulator    . CARPAL TUNNEL RELEASE    . TONSILLECTOMY    . URETERAL STENT PLACEMENT  2008    Allergies  Allergen Reactions  . Clindamycin/Lincomycin Hives and Swelling  . Ibuprofen Hives    Hives across the face  . Tomato Hives  . Vicodin [Hydrocodone-Acetaminophen] Hives    Social History   Socioeconomic History  . Marital status: Single    Spouse name: Not on file  . Number of children:  Not on file  . Years of education: Not on file  . Highest education level: Not on file  Occupational History  . Not on file  Social Needs  . Financial resource strain: Not on file  . Food insecurity:    Worry: Not on file    Inability: Not on file  . Transportation needs:    Medical: Not on file    Non-medical: Not on file  Tobacco Use  . Smoking status: Former Research scientist (life sciences)  . Smokeless tobacco: Never Used  . Tobacco comment: quit 2004  Substance and Sexual Activity  . Alcohol use: No  . Drug use: No  . Sexual activity: Yes    Birth control/protection: None, Condom  Lifestyle  . Physical activity:    Days per week: Not on file    Minutes per session: Not on file  . Stress: Not on file  Relationships  . Social connections:    Talks on phone: Not on file    Gets together: Not on file    Attends religious service: Not on file    Active member of club or organization: Not on file    Attends meetings of clubs or organizations: Not on file    Relationship status: Not on file  . Intimate partner violence:    Fear of current or ex partner: Not on file    Emotionally abused: Not on file    Physically abused: Not on file    Forced sexual activity: Not on file  Other Topics Concern  . Not on file  Social History Narrative  . Not on file    No family history on file.  BP (!) 159/91   Pulse 94   Ht 5' (1.524 m)   Wt 288 lb (130.6 kg)   BMI 56.25 kg/m   Review of Systems: See HPI above.     Objective:  Physical Exam:  Gen: NAD, comfortable in exam room  Right wrist: No deformity, swelling, bruising.  No clicking. FROM with 5/5 strength but pain ulnar side of wrist on ulnar deviation, flexion, and extension. TTP ulnar side of wrist and distal forearm including at TFCC today.  No other tenderness. NVI distally. Negative finkelsteins.   Assessment & Plan:  1. Right wrist pain - radiographs negative.  Unfortunately hasn't noted improvement with splinting, flexeril, OTC  medications.  Will go ahead with CT arthrogram (cannot have MRI due to metal stent per patient) to assess for TFCC tear.  New gutter splint placed today.  Icing, voltaren gel, flexeril, salon  pas.

## 2017-12-28 NOTE — Addendum Note (Signed)
Addended by: Sherrie George F on: 12/28/2017 01:25 PM   Modules accepted: Orders

## 2018-01-02 ENCOUNTER — Other Ambulatory Visit: Payer: Self-pay | Admitting: Family Medicine

## 2018-01-02 DIAGNOSIS — M25531 Pain in right wrist: Secondary | ICD-10-CM

## 2018-01-05 ENCOUNTER — Inpatient Hospital Stay: Admission: RE | Admit: 2018-01-05 | Payer: Medicaid Other | Source: Ambulatory Visit

## 2018-01-28 ENCOUNTER — Other Ambulatory Visit: Payer: Self-pay

## 2018-01-28 ENCOUNTER — Encounter (HOSPITAL_BASED_OUTPATIENT_CLINIC_OR_DEPARTMENT_OTHER): Payer: Self-pay | Admitting: *Deleted

## 2018-01-28 ENCOUNTER — Emergency Department (HOSPITAL_BASED_OUTPATIENT_CLINIC_OR_DEPARTMENT_OTHER): Payer: Medicaid Other

## 2018-01-28 ENCOUNTER — Emergency Department (HOSPITAL_BASED_OUTPATIENT_CLINIC_OR_DEPARTMENT_OTHER)
Admission: EM | Admit: 2018-01-28 | Discharge: 2018-01-29 | Disposition: A | Discharge status: Home / Self Care | Payer: Medicaid Other | Attending: Emergency Medicine | Admitting: Emergency Medicine

## 2018-01-28 DIAGNOSIS — Z87891 Personal history of nicotine dependence: Secondary | ICD-10-CM | POA: Insufficient documentation

## 2018-01-28 DIAGNOSIS — J45909 Unspecified asthma, uncomplicated: Secondary | ICD-10-CM | POA: Diagnosis not present

## 2018-01-28 DIAGNOSIS — R0789 Other chest pain: Secondary | ICD-10-CM | POA: Diagnosis not present

## 2018-01-28 DIAGNOSIS — I10 Essential (primary) hypertension: Secondary | ICD-10-CM | POA: Diagnosis not present

## 2018-01-28 DIAGNOSIS — Z79899 Other long term (current) drug therapy: Secondary | ICD-10-CM | POA: Insufficient documentation

## 2018-01-28 LAB — BASIC METABOLIC PANEL
Anion gap: 10 (ref 5–15)
BUN: 19 mg/dL (ref 6–20)
CALCIUM: 9 mg/dL (ref 8.9–10.3)
CHLORIDE: 98 mmol/L (ref 98–111)
CO2: 26 mmol/L (ref 22–32)
Creatinine, Ser: 1.15 mg/dL — ABNORMAL HIGH (ref 0.44–1.00)
GFR calc Af Amer: >60 mL/min (ref >60)
GFR, EST NON AFRICAN AMERICAN: 56 mL/min — AB (ref >60)
Glucose, Bld: 97 mg/dL (ref 70–99)
POTASSIUM: 3.2 mmol/L — AB (ref 3.5–5.1)
Sodium: 134 mmol/L — ABNORMAL LOW (ref 135–145)

## 2018-01-28 LAB — CBC
HEMATOCRIT: 34.5 % — AB (ref 36.0–46.0)
Hemoglobin: 11.4 g/dL — ABNORMAL LOW (ref 12.0–15.0)
MCH: 29.1 pg (ref 26.0–34.0)
MCHC: 33 g/dL (ref 30.0–36.0)
MCV: 88 fL (ref 78.0–100.0)
PLATELETS: 358 10*3/uL (ref 150–400)
RBC: 3.92 MIL/uL (ref 3.87–5.11)
RDW: 13.3 % (ref 11.5–15.5)
WBC: 11.4 10*3/uL — AB (ref 4.0–10.5)

## 2018-01-28 LAB — TROPONIN I

## 2018-01-28 MED ORDER — KETOROLAC TROMETHAMINE 30 MG/ML IJ SOLN
15.0000 mg | Freq: Once | INTRAMUSCULAR | Status: AC
  Administered 2018-01-29: 15 mg via INTRAVENOUS
  Filled 2018-01-28: qty 1

## 2018-01-28 MED ORDER — GI COCKTAIL ~~LOC~~
30.0000 mL | Freq: Once | ORAL | Status: AC
  Administered 2018-01-29: 30 mL via ORAL
  Filled 2018-01-28: qty 30

## 2018-01-28 NOTE — ED Notes (Signed)
Pt given cup to attempt urine sample. 

## 2018-01-28 NOTE — ED Triage Notes (Signed)
Pt reports chest pain x 4 hours. "Stabbing in my chest". Pt also c/o cough

## 2018-01-28 NOTE — ED Provider Notes (Signed)
[facility] EMERGENCY DEPARTMENT Provider Note   CSN: [id number] Arrival date & time: 01/28/18  2247     History   Chief Complaint Chief Complaint  Patient presents with  . Chest Pain    HPI Felicia Molina is a 47 y.o. female.  HPI  This is a 47 year old female with a history of asthma, hypertension, obesity who presents with chest pain.  Patient reports onset of left-sided chest pain approximately 4 hours prior to arrival.  She states that it gradually got worse.  She describes it as sharp and stabbing in nature.  It is nonradiating.  She rates her pain at 10 out of 10.  She did take "half of 1 of my pain pills" with minimal relief.  She denies any injury.  Denies any recent fevers or cough.  Denies any leg swelling or history of blood clots.  She denies any history of coronary artery disease.  She states the pain is worse with palpation.  Past Medical History:  Diagnosis Date  . Asthma   . Back pain, chronic   . Fibromyalgia   . Hypertension   . Morbid obesity ([place])   . Neurogenic bladder   . Pain management contract signed   . Panic attacks   . Reflux     Patient Active Problem List   Diagnosis Date Noted  . Right wrist pain 11/08/2017  . Asthma 10/14/2017  . Depression 10/14/2017  . GERD (gastroesophageal reflux disease) 10/14/2017  . Fibroid 09/09/2017  . Morbid obesity with BMI of 50.0-59.9, adult ([place]) 08/05/2016  . Rheumatoid arthritis ([place]) 08/05/2016  . Chronic bilateral low back pain without sciatica 05/13/2015  . Plantar fasciitis, bilateral 07/19/2012  . Metatarsalgia of both feet 07/19/2012  . Acute respiratory failure ([place]) 05/07/2012  . Hypertension   . CAP (community acquired pneumonia) 05/06/2012    Past Surgical History:  Procedure Laterality Date  . BACK SURGERY    . bladder stimulator    . CARPAL TUNNEL RELEASE    . TONSILLECTOMY    . URETERAL STENT PLACEMENT  2008     OB History    Gravida  6   Para  4   Term  4   Preterm  0   AB  2   Living  4     SAB  2   TAB  0   Ectopic  0   Multiple  0   Live Births  4            Home Medications    Prior to Admission medications   Medication Sig Start Date End Date Taking? Authorizing Provider  albuterol (PROVENTIL HFA;VENTOLIN HFA) 108 (90 BASE) MCG/ACT inhaler Inhale 2 puffs into the lungs every 6 (six) hours as needed. Takes for shortness of breath    [provider]  ALPRAZolam (XANAX) 0.5 MG tablet Take 0.5 mg by mouth at bedtime as needed. Takes for depression    [provider]  amLODipine (NORVASC) 10 MG tablet Take 15 mg by mouth daily.    [provider]  atenolol (TENORMIN) 25 MG tablet Take 25 mg by mouth daily.    [provider]  atenolol-chlorthalidone (TENORETIC) 100-25 MG tablet Take by mouth. 10/14/17   [provider]  budesonide-formoterol (SYMBICORT) 160-4.5 MCG/ACT inhaler Inhale 2 puffs into the lungs 2 (two) times daily.    [provider]  buPROPion [facility] SR) 150 MG 12 hr tablet Take by mouth. 10/14/17   [provider]  cetirizine (ZYRTEC) 10 MG tablet TK 1 T PO DAILY 10/14/17   [provider]  citalopram (CELEXA) 20 MG tablet Take by mouth. 06/11/16   [provider]  cyclobenzaprine (FLEXERIL) 10 MG tablet Take 10 mg by mouth 3 (three) times daily as needed. Takes for muscle spasms    [provider]  diclofenac sodium (VOLTAREN) 1 % GEL Apply 2 g topically 4 (four) times daily.    [provider]  fluticasone (FLONASE) 50 MCG/ACT nasal spray 2 SPRAYS INTO EACH NOSTRIL DAILY 10/14/17   [provider]  furosemide (LASIX) 40 MG tablet Take 1 tablet (40 mg total) by mouth daily. 05/09/12   Verlee Monte, MD  hydrALAZINE (APRESOLINE) 25 MG tablet Take 100 mg by mouth daily.    [provider]  ipratropium (ATROVENT) 0.06 % nasal spray Place 2 sprays into the nose 4 (four) times daily. 03/04/13   Billy Fischer, MD  LISINOPRIL PO Take 40 mg by mouth daily.     [provider]  losartan (COZAAR) 100 MG tablet TK 1 T PO DAILY 10/14/17   [provider]  montelukast (SINGULAIR) 10 MG tablet Take 10 mg by mouth at bedtime.    [provider]  naproxen (NAPROSYN) 500 MG tablet Take 1 tablet (500 mg total) by mouth 2 (two) times daily. 01/29/18   Merryl Hacker, MD  norethindrone (MICRONOR,CAMILA,ERRIN) 0.35 MG tablet  09/09/17   [provider]  Norgestimate-Ethinyl Estradiol Triphasic (ORTHO TRI-CYCLEN LO) 0.18/0.215/0.25 MG-25 MCG tab Take 1 tablet by mouth daily. 09/03/17   Drenda Freeze, MD  OMEPRAZOLE PO Take 40 mg by mouth daily.     [provider]  oxyCODONE-acetaminophen (PERCOCET) 5-325 MG per tablet Take 1 tablet by mouth every 4 (four) hours as needed for moderate pain. 2/42/35   [name], MD  oxymorphone (OPANA ER) 20 MG 12 hr tablet Take 20 mg by mouth every 12 (twelve) hours.    [provider]  potassium chloride SA (K-DUR,KLOR-CON) 20 MEQ tablet Take 1 tablet (20 mEq total) by mouth daily. 09/03/17   Drenda Freeze, MD  pregabalin (LYRICA) 75 MG capsule Take 75 mg by mouth 3 (three) times daily.    [provider]  rosuvastatin (CRESTOR) 20 MG tablet TK 1 T PO  Q NIGHT 10/14/17   [provider]    Family History No family history on file.  Social History Social History   Tobacco Use  . Smoking status: Former Research scientist (life sciences)  . Smokeless tobacco: Never Used  . Tobacco comment: quit 2004  Substance Use Topics  . Alcohol use: No  . Drug use: No     Allergies   Clindamycin/lincomycin; Ibuprofen; Tomato; and Vicodin [hydrocodone-acetaminophen]   Review of Systems Review of Systems  Constitutional: Negative for fever.  Respiratory: Negative for cough and shortness of breath.   Cardiovascular: Positive for chest pain. Negative for leg swelling.  Gastrointestinal: Negative for abdominal pain,  nausea and vomiting.  Genitourinary: Negative for dysuria.  All other systems reviewed and are negative.    Physical Exam Updated Vital Signs BP 116/66   Pulse 79   Temp 98.5 F (36.9 C) (Oral)   Resp (!) 21   Ht 1.524 m (5')   Wt 131.5 kg   SpO2 96%   BMI 56.64 kg/m   Physical Exam  Constitutional: She is oriented to person, place, and time. She appears well-developed and well-nourished.  Morbidly obese, nontoxic-appearing  HENT:  Head: Normocephalic and atraumatic.  Eyes: Pupils are equal, round, and reactive to light.  Neck: Neck supple.  Cardiovascular: Normal rate, regular rhythm, normal heart sounds and normal pulses.  No murmur heard. Pulmonary/Chest: Effort normal. No respiratory distress. She has no wheezes.  Abdominal: Soft. Bowel sounds are normal.  Musculoskeletal:       Right lower leg: She exhibits no tenderness and no edema.       Left lower leg: She exhibits no tenderness and no edema.  Neurological: She is alert and oriented to person, place, and time.  Skin: Skin is warm and dry.  Psychiatric: She has a normal mood and affect.  Nursing note and vitals reviewed.    ED Treatments / Results  Labs (all labs ordered are listed, but only abnormal results are displayed) Labs Reviewed  BASIC METABOLIC PANEL - Abnormal; Notable for the following components:      Result Value   Sodium 134 (*)    Potassium 3.2 (*)    Creatinine, Ser 1.15 (*)    GFR calc non Af Amer 56 (*)    All other components within normal limits  CBC - Abnormal; Notable for the following components:   WBC 11.4 (*)    Hemoglobin 11.4 (*)    HCT 34.5 (*)    All other components within normal limits  TROPONIN I  PREGNANCY, URINE    EKG EKG Interpretation  Date/Time:  Saturday January 28 2018 22:59:04 EDT Ventricular Rate:  82 PR Interval:  136 QRS Duration: 82 QT Interval:  406 QTC Calculation: 474 R Axis:   10 Text Interpretation:  Normal sinus rhythm Normal ECG Confirmed  by Thayer Jew [phone]) on 01/28/2018 11:11:33 PM   Radiology Dg Chest 2 View  Result Date: 01/28/2018 CLINICAL DATA:  Chest pain EXAM: CHEST - 2 VIEW COMPARISON:  December 12, 2015 FINDINGS: The heart size and mediastinal contours are within normal limits. Both lungs are clear. The visualized skeletal structures are unremarkable. IMPRESSION: No active cardiopulmonary disease. Electronically Signed   By: Dorise Bullion III M.D   On: 01/28/2018 23:24    Procedures Procedures (including critical care time)  Medications Ordered in ED Medications  ketorolac (TORADOL) 30 MG/ML injection 15 mg (15 mg Intravenous Given 01/29/18 0013)  gi cocktail (Maalox,Lidocaine,Donnatal) (30 mLs Oral Given 01/29/18 0012)     Initial Impression / Assessment and Plan / ED Course  I have reviewed the triage vital signs and the nursing notes.  Pertinent labs & imaging results that were available during my care of the patient were reviewed by me and considered in my medical decision making (see chart for details).     Patient presents with chest pain.  She is overall nontoxic-appearing vital signs are reassuring.  Pain is very atypical in nature and reproducible on exam.  Patient does have risk factors for ACS including morbid obesity and hypertension.  Her EKG is nonischemic and normal.  Troponin is negative.  Given that her pain has been ongoing for greater than 4 hours, feel 1 troponin is reassuring.  Patient was given Toradol.  She has not had any infectious symptoms.  Chest x-ray shows no evidence of pneumothorax or pneumonia.  On recheck, she is sleeping and without signs of objective discomfort.  Will treat for chest wall pain.  However, will have patient follow-up with cardiology as well for stress testing given her risk factors.  She is also low risk and PERC negative for PE.  After history, exam, and  medical workup I feel the patient has been appropriately medically screened and is safe for discharge  home. Pertinent diagnoses were discussed with the patient. Patient was given return precautions.   Final Clinical Impressions(s) / ED Diagnoses   Final diagnoses:  Atypical chest pain  Chest wall pain    ED Discharge Orders         Ordered    naproxen (NAPROSYN) 500 MG tablet  2 times daily,   Status:  Discontinued     01/29/18 0127    naproxen (NAPROSYN) 500 MG tablet  2 times daily     01/29/18 0128           Horton, Barbette Hair, MD 01/29/18 0131

## 2018-01-28 NOTE — ED Notes (Signed)
C/o cp onset 5 hours ago  Pain increased w deep breath, movement , palpation

## 2018-01-29 LAB — PREGNANCY, URINE: Preg Test, Ur: NEGATIVE

## 2018-01-29 MED ORDER — NAPROXEN 500 MG PO TABS: 500.0000 mg | ORAL_TABLET | Freq: Two times a day (BID) | ORAL | 0 refills | Status: DC

## 2018-01-29 NOTE — Discharge Instructions (Signed)
You were seen today for chest pain.  Your work-up here is reassuring.  However, given your risk factors, follow-up with cardiology for stress testing.  If your pain worsens or you have any new or worsening symptoms you should be reevaluated.

## 2018-04-22 ENCOUNTER — Other Ambulatory Visit: Payer: Self-pay

## 2018-04-22 ENCOUNTER — Encounter (HOSPITAL_BASED_OUTPATIENT_CLINIC_OR_DEPARTMENT_OTHER): Payer: Self-pay | Admitting: *Deleted

## 2018-04-22 ENCOUNTER — Emergency Department (HOSPITAL_BASED_OUTPATIENT_CLINIC_OR_DEPARTMENT_OTHER)
Admission: EM | Admit: 2018-04-22 | Discharge: 2018-04-22 | Discharge status: Against Medical Advice | Payer: Medicaid Other | Attending: Emergency Medicine | Admitting: Emergency Medicine

## 2018-04-22 DIAGNOSIS — R0981 Nasal congestion: Secondary | ICD-10-CM | POA: Diagnosis not present

## 2018-04-22 DIAGNOSIS — Z5321 Procedure and treatment not carried out due to patient leaving prior to being seen by health care provider: Secondary | ICD-10-CM

## 2018-04-22 NOTE — ED Notes (Signed)
Pt sts she does not want to be seen now bc she got an appt with her PCP. She still wants her child to be evaluated.

## 2018-04-22 NOTE — ED Triage Notes (Signed)
URI x 2 days. Denies fever

## 2018-04-23 NOTE — ED Provider Notes (Signed)
Patient here with son.  Upon my arrival to the room, patient states she no longer wants to be seen, as she has an appoint with her primary care doctor.  No evaluation or assessment performed.   Franchot Heidelberg, PA-C 04/23/18 0127    Quintella Reichert, MD 04/23/18 [phone]

## 2018-05-29 ENCOUNTER — Ambulatory Visit: Payer: Self-pay | Admitting: Cardiology

## 2018-06-11 ENCOUNTER — Other Ambulatory Visit: Payer: Self-pay

## 2018-06-11 ENCOUNTER — Encounter (HOSPITAL_BASED_OUTPATIENT_CLINIC_OR_DEPARTMENT_OTHER): Payer: Self-pay | Admitting: *Deleted

## 2018-06-11 ENCOUNTER — Emergency Department (HOSPITAL_BASED_OUTPATIENT_CLINIC_OR_DEPARTMENT_OTHER)
Admission: EM | Admit: 2018-06-11 | Discharge: 2018-06-11 | Disposition: A | Discharge status: Home / Self Care | Payer: Medicaid Other | Attending: Emergency Medicine | Admitting: Emergency Medicine

## 2018-06-11 ENCOUNTER — Emergency Department (HOSPITAL_BASED_OUTPATIENT_CLINIC_OR_DEPARTMENT_OTHER): Payer: Medicaid Other

## 2018-06-11 DIAGNOSIS — W010XXA Fall on same level from slipping, tripping and stumbling without subsequent striking against object, initial encounter: Secondary | ICD-10-CM | POA: Insufficient documentation

## 2018-06-11 DIAGNOSIS — I10 Essential (primary) hypertension: Secondary | ICD-10-CM | POA: Insufficient documentation

## 2018-06-11 DIAGNOSIS — J45909 Unspecified asthma, uncomplicated: Secondary | ICD-10-CM | POA: Diagnosis not present

## 2018-06-11 DIAGNOSIS — W19XXXA Unspecified fall, initial encounter: Secondary | ICD-10-CM

## 2018-06-11 DIAGNOSIS — M069 Rheumatoid arthritis, unspecified: Secondary | ICD-10-CM | POA: Diagnosis not present

## 2018-06-11 DIAGNOSIS — Z87891 Personal history of nicotine dependence: Secondary | ICD-10-CM | POA: Diagnosis not present

## 2018-06-11 DIAGNOSIS — Z79899 Other long term (current) drug therapy: Secondary | ICD-10-CM | POA: Insufficient documentation

## 2018-06-11 DIAGNOSIS — R0789 Other chest pain: Secondary | ICD-10-CM | POA: Diagnosis present

## 2018-06-11 NOTE — ED Provider Notes (Signed)
[facility] EMERGENCY DEPARTMENT Provider Note   CSN: [id number] Arrival date & time: 06/11/18  1632     History   Chief Complaint Chief Complaint  Patient presents with  . Fall    HPI Felicia Molina is a 48 y.o. female.  The history is provided by the patient and medical records. No language interpreter was used.  Fall  Pertinent negatives include no chest pain, no abdominal pain, no headaches and no shortness of breath.   Felicia Molina is a 48 y.o. female  with a PMH as listed below including chronic back pain and fibromyalgia who presents to the Emergency Department complaining of left lateral rib cage pain/flank pain x2 days.  Patient states that it was raining and she was walking outside when she slipped and fell, landing on her left side.  Denies any head injury or loss of consciousness.  No back pain.  Pain is worse with certain movements and when she takes big breaths or laughs.  She has extensive pain medication at home including Percocet, Flexeril, Lyrica and Voltaren gel.  She has been taking these all as she usually does, but does not feel like it is helping her pain from the fall very much.   Past Medical History:  Diagnosis Date  . Asthma   . Back pain, chronic   . Fibromyalgia   . Hypertension   . Morbid obesity ([place])   . Neurogenic bladder   . Pain management contract signed   . Panic attacks   . Reflux     Patient Active Problem List   Diagnosis Date Noted  . Right wrist pain 11/08/2017  . Asthma 10/14/2017  . Depression 10/14/2017  . GERD (gastroesophageal reflux disease) 10/14/2017  . Fibroid 09/09/2017  . Morbid obesity with BMI of 50.0-59.9, adult ([place]) 08/05/2016  . Rheumatoid arthritis ([place]) 08/05/2016  . Chronic bilateral low back pain without sciatica 05/13/2015  . Plantar fasciitis, bilateral 07/19/2012  . Metatarsalgia of both feet 07/19/2012  . Acute respiratory failure ([place]) 05/07/2012  . Hypertension   . CAP (community  acquired pneumonia) 05/06/2012    Past Surgical History:  Procedure Laterality Date  . BACK SURGERY    . bladder stimulator    . CARPAL TUNNEL RELEASE    . TONSILLECTOMY    . URETERAL STENT PLACEMENT  2008     OB History    Gravida  6   Para  4   Term  4   Preterm  0   AB  2   Living  4     SAB  2   TAB  0   Ectopic  0   Multiple  0   Live Births  4            Home Medications    Prior to Admission medications   Medication Sig Start Date End Date Taking? Authorizing Provider  albuterol (PROVENTIL HFA;VENTOLIN HFA) 108 (90 BASE) MCG/ACT inhaler Inhale 2 puffs into the lungs every 6 (six) hours as needed. Takes for shortness of breath    [provider]  ALPRAZolam (XANAX) 0.5 MG tablet Take 0.5 mg by mouth at bedtime as needed. Takes for depression    [provider]  amLODipine (NORVASC) 10 MG tablet Take 15 mg by mouth daily.    [provider]  atenolol (TENORMIN) 25 MG tablet Take 25 mg by mouth daily.    [provider]  atenolol-chlorthalidone (TENORETIC) 100-25 MG tablet Take by mouth. 10/14/17  [provider]  budesonide-formoterol (SYMBICORT) 160-4.5 MCG/ACT inhaler Inhale 2 puffs into the lungs 2 (two) times daily.    [provider]  buPROPion (WELLBUTRIN SR) 150 MG 12 hr tablet Take by mouth. 10/14/17   [provider]  cetirizine (ZYRTEC) 10 MG tablet TK 1 T PO DAILY 10/14/17   [provider]  citalopram (CELEXA) 20 MG tablet Take by mouth. 06/11/16   [provider]  cyclobenzaprine (FLEXERIL) 10 MG tablet Take 10 mg by mouth 3 (three) times daily as needed. Takes for muscle spasms    [provider]  diclofenac sodium (VOLTAREN) 1 % GEL Apply 2 g topically 4 (four) times daily.    [provider]  fluticasone (FLONASE) 50 MCG/ACT nasal spray 2 SPRAYS INTO EACH NOSTRIL DAILY 10/14/17   [provider]  furosemide (LASIX) 40 MG tablet Take 1  tablet (40 mg total) by mouth daily. 05/09/12   Verlee Monte, MD  hydrALAZINE (APRESOLINE) 25 MG tablet Take 100 mg by mouth daily.    [provider]  ipratropium (ATROVENT) 0.06 % nasal spray Place 2 sprays into the nose 4 (four) times daily. 03/04/13   Billy Fischer, MD  LISINOPRIL PO Take 40 mg by mouth daily.     [provider]  losartan (COZAAR) 100 MG tablet TK 1 T PO DAILY 10/14/17   [provider]  montelukast (SINGULAIR) 10 MG tablet Take 10 mg by mouth at bedtime.    [provider]  naproxen (NAPROSYN) 500 MG tablet Take 1 tablet (500 mg total) by mouth 2 (two) times daily. 01/29/18   Merryl Hacker, MD  norethindrone (MICRONOR,CAMILA,ERRIN) 0.35 MG tablet  09/09/17   [provider]  Norgestimate-Ethinyl Estradiol Triphasic (ORTHO TRI-CYCLEN LO) 0.18/0.215/0.25 MG-25 MCG tab Take 1 tablet by mouth daily. 09/03/17   Drenda Freeze, MD  OMEPRAZOLE PO Take 40 mg by mouth daily.     [provider]  oxyCODONE-acetaminophen (PERCOCET) 5-325 MG per tablet Take 1 tablet by mouth every 4 (four) hours as needed for moderate pain. 05/29/53   [name], MD  oxymorphone (OPANA ER) 20 MG 12 hr tablet Take 20 mg by mouth every 12 (twelve) hours.    [provider]  potassium chloride SA (K-DUR,KLOR-CON) 20 MEQ tablet Take 1 tablet (20 mEq total) by mouth daily. 09/03/17   Drenda Freeze, MD  pregabalin (LYRICA) 75 MG capsule Take 75 mg by mouth 3 (three) times daily.    [provider]  rosuvastatin (CRESTOR) 20 MG tablet TK 1 T PO  Q NIGHT 10/14/17   [provider]    Family History Family History  Problem Relation Age of Onset  . Heart attack Mother     Social History Social History   Tobacco Use  . Smoking status: Former Research scientist (life sciences)  . Smokeless tobacco: Never Used  . Tobacco comment: quit 2004  Substance Use Topics  . Alcohol use: No  . Drug use: No     Allergies   Clindamycin/lincomycin;  Ibuprofen; Tomato; and Vicodin [hydrocodone-acetaminophen]   Review of Systems Review of Systems  Respiratory: Negative for shortness of breath.   Cardiovascular: Negative for chest pain.  Gastrointestinal: Negative for abdominal pain, nausea and vomiting.  Musculoskeletal: Positive for arthralgias and myalgias. Negative for joint swelling.  Neurological: Negative for weakness, numbness and headaches.     Physical Exam Updated Vital Signs BP 138/81 (BP Location: Left Arm)   Pulse 98   Temp 98  F (36.7 C) (Oral)   Resp 18   Ht 5' (1.524 m)   Wt 129.7 kg   SpO2 100%   BMI 55.86 kg/m   Physical Exam Vitals signs and nursing note reviewed.  Constitutional:      General: She is not in acute distress.    Appearance: She is well-developed.  HENT:     Head: Normocephalic and atraumatic.  Neck:     Musculoskeletal: Neck supple.  Cardiovascular:     Rate and Rhythm: Normal rate and regular rhythm.     Heart sounds: Normal heart sounds. No murmur.  Pulmonary:     Effort: Pulmonary effort is normal. No respiratory distress.     Breath sounds: Normal breath sounds. No wheezing or rales.  Musculoskeletal: Normal range of motion.     Comments: Tenderness to palpation along left lateral lower rib cage area.  No crepitus or overlying skin changes appreciated.  No abdominal tenderness C/T/L-spine.  Full range of motion and 5/5 muscle strength in all 4 extremities.  Skin:    General: Skin is warm and dry.  Neurological:     Mental Status: She is alert.     Comments: All 4 extremities neurovascularly intact.      ED Treatments / Results  Labs (all labs ordered are listed, but only abnormal results are displayed) Labs Reviewed - No data to display  EKG None  Radiology Dg Ribs Unilateral W/chest Left  Result Date: 06/11/2018 CLINICAL DATA:  Fall 2 days ago with left-sided rib pain. EXAM: LEFT RIBS AND CHEST - 3+ VIEW COMPARISON:  January 28, 2018 FINDINGS: No fracture or other  bone lesions are seen involving the ribs. There is no evidence of pneumothorax or pleural effusion. Both lungs are clear. Heart size and mediastinal contours are within normal limits. IMPRESSION: Negative. Electronically Signed   By: Dorise Bullion III M.D   On: 06/11/2018 17:49    Procedures Procedures (including critical care time)  Medications Ordered in ED Medications - No data to display   Initial Impression / Assessment and Plan / ED Course  I have reviewed the triage vital signs and the nursing notes.  Pertinent labs & imaging results that were available during my care of the patient were reviewed by me and considered in my medical decision making (see chart for details).    Felicia Molina is a 48 y.o. female who presents to ED for left rib cage pain after a mechanical fall 2 days ago.  Tenderness to the area on exam with no crepitus or overlying skin changes. No midline tenderness of the back. NVI in all four extremities. Lungs clear. X-ray negative. Discharged home with symptomatic home care instructions, PCP follow up if no improvement. All questions answered.    Final Clinical Impressions(s) / ED Diagnoses   Final diagnoses:  Fall, initial encounter    ED Discharge Orders    None       Ward, Ozella Almond, PA-C 06/11/18 Delaney Meigs, MD 06/11/18 [phone]

## 2018-06-11 NOTE — ED Notes (Signed)
Patient transported to X-ray 

## 2018-06-11 NOTE — ED Triage Notes (Signed)
Pt reports she fell outside 2 days ago and landed on left side. C/o pain in left flank/ribs with movement

## 2018-06-11 NOTE — Discharge Instructions (Signed)
It was my pleasure taking care of you today!   Fortunately, your x-ray was normal with no injuries seen from your fall.  Continue your home pain medication regimen.  Can also use lidocaine patches as we discussed for additional relief as well as heating pad.  Follow-up with your primary care doctor if your symptoms are not improving in the next week.  Return to the emergency department for new or worsening symptoms, any additional concerns.

## 2018-07-28 ENCOUNTER — Ambulatory Visit (INDEPENDENT_AMBULATORY_CARE_PROVIDER_SITE_OTHER): Payer: Medicaid Other | Admitting: Cardiology

## 2018-07-28 ENCOUNTER — Other Ambulatory Visit: Payer: Self-pay

## 2018-07-28 ENCOUNTER — Encounter: Payer: Self-pay | Admitting: Cardiology

## 2018-07-28 DIAGNOSIS — R0789 Other chest pain: Secondary | ICD-10-CM | POA: Diagnosis not present

## 2018-07-28 DIAGNOSIS — R002 Palpitations: Secondary | ICD-10-CM

## 2018-07-28 HISTORY — DX: Other chest pain: R07.89

## 2018-07-28 HISTORY — DX: Palpitations: R00.2

## 2018-07-28 NOTE — Progress Notes (Signed)
Follow up visit  Subjective:   Felicia Molina, female    DOB: 05-19-1970, 49 y.o.   MRN: [id number]   Chief Complaint  Patient presents with  . Hypertension  . Palpitations  . Follow-up     HPI  48 y.o. African American female with hypertension, prediabetes, morbid obesity, mild hyperlipidemia, here for acute visit for chest pain.   Pain has been ongoing since yesterday. It is left sided, hurts on pressing on her chest wall. She reports lifting her 45 lb child. She denies any exertional component to her chest pain. BP is well controlled.   Past Medical History:  Diagnosis Date  . Asthma   . Back pain, chronic   . Fibromyalgia   . Hypertension   . Morbid obesity ([place])   . Neurogenic bladder   . Pain management contract signed   . Panic attacks   . Reflux      Past Surgical History:  Procedure Laterality Date  . BACK SURGERY    . bladder stimulator    . CARPAL TUNNEL RELEASE    . TONSILLECTOMY    . URETERAL STENT PLACEMENT  2008     Social History   Socioeconomic History  . Marital status: Single    Spouse name: Not on file  . Number of children: 4  . Years of education: Not on file  . Highest education level: Not on file  Occupational History  . Not on file  Social Needs  . Financial resource strain: Not on file  . Food insecurity:    Worry: Not on file    Inability: Not on file  . Transportation needs:    Medical: Not on file    Non-medical: Not on file  Tobacco Use  . Smoking status: Former Research scientist (life sciences)  . Smokeless tobacco: Never Used  . Tobacco comment: quit 2004  Substance and Sexual Activity  . Alcohol use: No  . Drug use: No  . Sexual activity: Yes    Birth control/protection: None, Condom  Lifestyle  . Physical activity:    Days per week: Not on file    Minutes per session: Not on file  . Stress: Not on file  Relationships  . Social connections:    Talks on phone: Not on file    Gets together: Not on file    Attends religious  service: Not on file    Active member of club or organization: Not on file    Attends meetings of clubs or organizations: Not on file    Relationship status: Not on file  . Intimate partner violence:    Fear of current or ex partner: Not on file    Emotionally abused: Not on file    Physically abused: Not on file    Forced sexual activity: Not on file  Other Topics Concern  . Not on file  Social History Narrative  . Not on file     Current Outpatient Medications on File Prior to Visit  Medication Sig Dispense Refill  . albuterol (PROVENTIL HFA;VENTOLIN HFA) 108 (90 BASE) MCG/ACT inhaler Inhale 2 puffs into the lungs every 6 (six) hours as needed. Takes for shortness of breath    . ALPRAZolam (XANAX) 0.5 MG tablet Take 0.5 mg by mouth at bedtime as needed. Takes for depression    . amLODipine (NORVASC) 10 MG tablet Take 25 mg by mouth daily.     Marland Kitchen atenolol (TENORMIN) 25 MG tablet Take 100 mg by mouth daily.     Marland Kitchen  budesonide-formoterol (SYMBICORT) 160-4.5 MCG/ACT inhaler Inhale 2 puffs into the lungs 2 (two) times daily.    Marland Kitchen buPROPion (WELLBUTRIN SR) 150 MG 12 hr tablet Take by mouth.    . cetirizine (ZYRTEC) 10 MG tablet TK 1 T PO DAILY  3  . citalopram (CELEXA) 20 MG tablet Take by mouth.    . cyclobenzaprine (FLEXERIL) 10 MG tablet Take 10 mg by mouth 3 (three) times daily as needed. Takes for muscle spasms    . diclofenac sodium (VOLTAREN) 1 % GEL Apply 2 g topically 4 (four) times daily.    . fluticasone (FLONASE) 50 MCG/ACT nasal spray 2 SPRAYS INTO EACH NOSTRIL DAILY  5  . furosemide (LASIX) 40 MG tablet Take 1 tablet (40 mg total) by mouth daily. 30 tablet 0  . hydrALAZINE (APRESOLINE) 25 MG tablet Take 100 mg by mouth daily.    Marland Kitchen ipratropium (ATROVENT) 0.06 % nasal spray Place 2 sprays into the nose 4 (four) times daily. 15 mL 1  . losartan (COZAAR) 100 MG tablet TK 1 T PO DAILY  3  . montelukast (SINGULAIR) 10 MG tablet Take 10 mg by mouth at bedtime.    . naproxen  (NAPROSYN) 500 MG tablet Take 1 tablet (500 mg total) by mouth 2 (two) times daily. 30 tablet 0  . norethindrone (MICRONOR,CAMILA,ERRIN) 0.35 MG tablet   11  . OMEPRAZOLE PO Take 40 mg by mouth daily.     Marland Kitchen oxyCODONE-acetaminophen (PERCOCET) 5-325 MG per tablet Take 1 tablet by mouth every 4 (four) hours as needed for moderate pain. 20 tablet 0  . potassium chloride SA (K-DUR,KLOR-CON) 20 MEQ tablet Take 1 tablet (20 mEq total) by mouth daily. 10 tablet 0  . pregabalin (LYRICA) 75 MG capsule Take 75 mg by mouth 3 (three) times daily.    . rosuvastatin (CRESTOR) 20 MG tablet TK 1 T PO  Q NIGHT  3  . atenolol-chlorthalidone (TENORETIC) 100-25 MG tablet Take by mouth.    . Norgestimate-Ethinyl Estradiol Triphasic (ORTHO TRI-CYCLEN LO) 0.18/0.215/0.25 MG-25 MCG tab Take 1 tablet by mouth daily. (Patient not taking: Reported on 07/28/2018) 1 Package 0  . oxymorphone (OPANA ER) 20 MG 12 hr tablet Take 20 mg by mouth every 12 (twelve) hours.     No current facility-administered medications on file prior to visit.     Cardiovascular studies:  EKG 07/28/2018: Sinus rhythm 71 bpm. Normal EKG.  Echocardiogram 06/21/2017: Left ventricle cavity is normal in size. Mild concentric hypertrophy of the left ventricle. Normal global wall motion. Doppler evidence of grade I (impaired) diastolic dysfunction, normal LAP. Calculated EF 63%. Mild to moderate mitral regurgitation. Inadequate tricuspid regurgitation jet to estimate pulmonary artery pressure. Normal right atrial pressure. No significant change from prior study dated 08/14/2014.  Lexiscan myoview stress test 12/24/2016: 1. This was a 2-day protocol.  The resting electrocardiogram demonstrated normal sinus rhythm, normal resting conduction and no resting arrhythmias. Low voltage.   Stress EKG is non-diagnostic for ischemia as it a pharmacologic stress using Lexiscan. Stress symptoms included dyspnea, nausea and dizziness.  2. SPECT images demonstrate  small perfusion abnormality of mild intensity in the apical myocardial wall(s) on the stress images.  These defects are related to apical thinning.  Gated SPECT images reveal normal myocardial thickening and wall motion.  The left ventricular ejection fraction was calculated or visually estimated to be 53%.  This is a low risk study.  Recent labs: Results for BLESSED, GIRDNER (MRN [id number]) as of 07/28/2018 15:36  Ref.  Range 01/28/2018 67:89  BASIC METABOLIC PANEL Unknown Rpt (A)  Sodium Latest Ref Range: 135 - 145 mmol/L 134 (L)  Potassium Latest Ref Range: 3.5 - 5.1 mmol/L 3.2 (L)  Chloride Latest Ref Range: 98 - 111 mmol/L 98  CO2 Latest Ref Range: 22 - 32 mmol/L 26  Glucose Latest Ref Range: 70 - 99 mg/dL 97  BUN Latest Ref Range: 6 - 20 mg/dL 19  Creatinine Latest Ref Range: 0.44 - 1.00 mg/dL 1.15 (H)  Calcium Latest Ref Range: 8.9 - 10.3 mg/dL 9.0  Anion gap Latest Ref Range: 5 - 15  10  GFR, Est Non African American Latest Ref Range: >60 mL/min 56 (L)  GFR, Est African American Latest Ref Range: >60 mL/min >60  Troponin I Latest Ref Range: <0.03 ng/mL <0.03   Results for JOELLA, SAEFONG (MRN [id number]) as of 07/28/2018 15:36  Ref. Range 01/28/2018 23:30  WBC Latest Ref Range: 4.0 - 10.5 K/uL 11.4 (H)  RBC Latest Ref Range: 3.87 - 5.11 MIL/uL 3.92  Hemoglobin Latest Ref Range: 12.0 - 15.0 g/dL 11.4 (L)  HCT Latest Ref Range: 36.0 - 46.0 % 34.5 (L)  MCV Latest Ref Range: 78.0 - 100.0 fL 88.0  MCH Latest Ref Range: 26.0 - 34.0 pg 29.1  MCHC Latest Ref Range: 30.0 - 36.0 g/dL 33.0  RDW Latest Ref Range: 11.5 - 15.5 % 13.3  Platelets Latest Ref Range: 150 - 400 K/uL 358     Review of Systems  Constitution: Negative for decreased appetite, malaise/fatigue, weight gain and weight loss.  HENT: Negative for congestion.   Eyes: Negative for visual disturbance.  Cardiovascular: Positive for chest pain. Negative for dyspnea on exertion, leg swelling, palpitations and syncope.  Respiratory:  Negative for shortness of breath.   Endocrine: Negative for cold intolerance.  Hematologic/Lymphatic: Does not bruise/bleed easily.  Skin: Negative for itching and rash.  Musculoskeletal: Negative for myalgias.  Gastrointestinal: Negative for abdominal pain, nausea and vomiting.  Genitourinary: Negative for dysuria.  Neurological: Negative for dizziness and weakness.  Psychiatric/Behavioral: The patient is not nervous/anxious.   All other systems reviewed and are negative.        Vitals:   07/28/18 1431  BP: 112/79  Pulse: 74  SpO2: 96%    Objective:   Physical Exam  Constitutional: She is oriented to person, place, and time. She appears well-developed and well-nourished. No distress.  Morbidly obese  HENT:  Head: Normocephalic and atraumatic.  Eyes: Pupils are equal, round, and reactive to light. Conjunctivae are normal.  Neck: No JVD present.  Cardiovascular: Normal rate, regular rhythm and intact distal pulses.  Pulmonary/Chest: Effort normal and breath sounds normal. She has no wheezes. She has no rales.  Reproducible chest wall tenderness  Abdominal: Soft. Bowel sounds are normal. There is no rebound.  Musculoskeletal:        General: No edema.  Lymphadenopathy:    She has no cervical adenopathy.  Neurological: She is alert and oriented to person, place, and time. No cranial nerve deficit.  Skin: Skin is warm and dry.  Psychiatric: She has a normal mood and affect.  Nursing note and vitals reviewed.         Assessment & Recommendations:   48 y.o. African American female with hypertension, prediabetes, morbid obesity, mild hyperlipidemia, here for acute visit for chest pain.   1. Musculoskeletal chest pain Normal EKG. Pain is clearly reproducible to palpation. I do not think she needs any further cardiac workup at this time.  I have discussed with patient regarding the safety during COVID Pandemic and steps and precautions to be taken including social  distancing, frequent hand wash and use of detergent soap, gels with the patient. I asked the patient to avoid touching mouth, nose, eyes, ears with her hands. I encouraged regular walking around the neighborhood and exercise and regular diet, as long as social distancing can be maintained.   Nigel Mormon, MD [facility] Pager: [phone] Office: [phone] If no answer Cell [phone]

## 2018-09-28 ENCOUNTER — Emergency Department (HOSPITAL_BASED_OUTPATIENT_CLINIC_OR_DEPARTMENT_OTHER)
Admission: EM | Admit: 2018-09-28 | Discharge: 2018-09-28 | Disposition: A | Discharge status: Home / Self Care | Payer: Medicaid Other | Attending: Emergency Medicine | Admitting: Emergency Medicine

## 2018-09-28 ENCOUNTER — Emergency Department (HOSPITAL_BASED_OUTPATIENT_CLINIC_OR_DEPARTMENT_OTHER): Payer: Medicaid Other

## 2018-09-28 ENCOUNTER — Encounter (HOSPITAL_BASED_OUTPATIENT_CLINIC_OR_DEPARTMENT_OTHER): Payer: Self-pay

## 2018-09-28 ENCOUNTER — Other Ambulatory Visit: Payer: Self-pay

## 2018-09-28 DIAGNOSIS — Z79899 Other long term (current) drug therapy: Secondary | ICD-10-CM | POA: Insufficient documentation

## 2018-09-28 DIAGNOSIS — J45909 Unspecified asthma, uncomplicated: Secondary | ICD-10-CM | POA: Insufficient documentation

## 2018-09-28 DIAGNOSIS — M069 Rheumatoid arthritis, unspecified: Secondary | ICD-10-CM | POA: Diagnosis not present

## 2018-09-28 DIAGNOSIS — R102 Pelvic and perineal pain: Secondary | ICD-10-CM | POA: Diagnosis present

## 2018-09-28 DIAGNOSIS — R109 Unspecified abdominal pain: Secondary | ICD-10-CM

## 2018-09-28 LAB — CBC WITH DIFFERENTIAL/PLATELET
Abs Immature Granulocytes: 0.04 10*3/uL (ref 0.00–0.07)
Basophils Absolute: 0 10*3/uL (ref 0.0–0.1)
Basophils Relative: 0 %
Eosinophils Absolute: 0.1 10*3/uL (ref 0.0–0.5)
Eosinophils Relative: 1 %
HCT: 36.4 % (ref 36.0–46.0)
Hemoglobin: 11.4 g/dL — ABNORMAL LOW (ref 12.0–15.0)
Immature Granulocytes: 0 %
Lymphocytes Relative: 33 %
Lymphs Abs: 3.9 10*3/uL (ref 0.7–4.0)
MCH: 28.8 pg (ref 26.0–34.0)
MCHC: 31.3 g/dL (ref 30.0–36.0)
MCV: 91.9 fL (ref 80.0–100.0)
Monocytes Absolute: 0.6 10*3/uL (ref 0.1–1.0)
Monocytes Relative: 5 %
Neutro Abs: 6.9 10*3/uL (ref 1.7–7.7)
Neutrophils Relative %: 61 %
Platelets: 354 10*3/uL (ref 150–400)
RBC: 3.96 MIL/uL (ref 3.87–5.11)
RDW: 13.5 % (ref 11.5–15.5)
WBC: 11.6 10*3/uL — ABNORMAL HIGH (ref 4.0–10.5)
nRBC: 0 % (ref 0.0–0.2)

## 2018-09-28 LAB — URINALYSIS, ROUTINE W REFLEX MICROSCOPIC
Bilirubin Urine: NEGATIVE
Glucose, UA: NEGATIVE mg/dL
Hgb urine dipstick: NEGATIVE
Ketones, ur: NEGATIVE mg/dL
Leukocytes,Ua: NEGATIVE
Nitrite: NEGATIVE
Protein, ur: 30 mg/dL — AB
Specific Gravity, Urine: >1.03 — ABNORMAL HIGH (ref 1.005–1.030)
pH: 5.5 (ref 5.0–8.0)

## 2018-09-28 LAB — BASIC METABOLIC PANEL
Anion gap: 9 (ref 5–15)
BUN: 11 mg/dL (ref 6–20)
CO2: 27 mmol/L (ref 22–32)
Calcium: 9 mg/dL (ref 8.9–10.3)
Chloride: 101 mmol/L (ref 98–111)
Creatinine, Ser: 0.97 mg/dL (ref 0.44–1.00)
GFR calc Af Amer: >60 mL/min (ref >60)
GFR calc non Af Amer: >60 mL/min (ref >60)
Glucose, Bld: 92 mg/dL (ref 70–99)
Potassium: 3.3 mmol/L — ABNORMAL LOW (ref 3.5–5.1)
Sodium: 137 mmol/L (ref 135–145)

## 2018-09-28 LAB — PREGNANCY, URINE: Preg Test, Ur: NEGATIVE

## 2018-09-28 LAB — URINALYSIS, MICROSCOPIC (REFLEX)

## 2018-09-28 MED ORDER — ONDANSETRON HCL 4 MG/2ML IJ SOLN
4.0000 mg | Freq: Once | INTRAMUSCULAR | Status: AC
  Administered 2018-09-28: 4 mg via INTRAVENOUS
  Filled 2018-09-28: qty 2

## 2018-09-28 MED ORDER — MORPHINE SULFATE (PF) 4 MG/ML IV SOLN
4.0000 mg | Freq: Once | INTRAVENOUS | Status: AC
  Administered 2018-09-28: 4 mg via INTRAVENOUS
  Filled 2018-09-28: qty 1

## 2018-09-28 MED ORDER — TRAMADOL HCL 50 MG PO TABS: 50.0000 mg | ORAL_TABLET | Freq: Four times a day (QID) | ORAL | 0 refills | Status: DC | PRN

## 2018-09-28 MED ORDER — KETOROLAC TROMETHAMINE 30 MG/ML IJ SOLN
30.0000 mg | Freq: Once | INTRAMUSCULAR | Status: AC
  Administered 2018-09-28: 30 mg via INTRAVENOUS
  Filled 2018-09-28: qty 1

## 2018-09-28 NOTE — ED Provider Notes (Signed)
[facility] EMERGENCY DEPARTMENT Provider Note   CSN: [id number] Arrival date & time: 09/28/18  1530    History   Chief Complaint Chief Complaint  Patient presents with  . Pelvic Pain    HPI Felicia Molina is a 48 y.o. female.     Patient is a 48 year old female with past medical history of hypertension, obesity, back pain, asthma.  She presents today for evaluation of left flank pain.  This is been occurring intermittently for the past 2 days, but became worse this afternoon.  She describes crampy pain to her left flank that radiates to her groin.  She describes it as feeling as if she "is in labor".  She denies any bowel or bladder complaints.  She denies any fevers or chills.  The history is provided by the patient.  Pelvic Pain  This is a new problem. The current episode started yesterday. The problem occurs constantly. The problem has been rapidly worsening. Nothing aggravates the symptoms. Nothing relieves the symptoms. She has tried nothing for the symptoms.    Past Medical History:  Diagnosis Date  . Asthma   . Atypical chest pain 07/28/2018  . Back pain, chronic   . Fibromyalgia   . Hypertension   . Morbid obesity ([place])   . Neurogenic bladder   . Pain management contract signed   . Palpitations 07/28/2018  . Panic attacks   . Reflux     Patient Active Problem List   Diagnosis Date Noted  . Atypical chest pain 07/28/2018  . Palpitations 07/28/2018  . Right wrist pain 11/08/2017  . Asthma 10/14/2017  . Depression 10/14/2017  . GERD (gastroesophageal reflux disease) 10/14/2017  . Fibroid 09/09/2017  . Morbid obesity with BMI of 50.0-59.9, adult ([place]) 08/05/2016  . Rheumatoid arthritis ([place]) 08/05/2016  . Chronic bilateral low back pain without sciatica 05/13/2015  . Plantar fasciitis, bilateral 07/19/2012  . Metatarsalgia of both feet 07/19/2012  . Acute respiratory failure ([place]) 05/07/2012  . Hypertension   . CAP (community acquired pneumonia)  05/06/2012    Past Surgical History:  Procedure Laterality Date  . BACK SURGERY    . bladder stimulator    . CARPAL TUNNEL RELEASE    . TONSILLECTOMY    . URETERAL STENT PLACEMENT  2008     OB History    Gravida  6   Para  4   Term  4   Preterm  0   AB  2   Living  4     SAB  2   TAB  0   Ectopic  0   Multiple  0   Live Births  4            Home Medications    Prior to Admission medications   Medication Sig Start Date End Date Taking? Authorizing Provider  albuterol (PROVENTIL HFA;VENTOLIN HFA) 108 (90 BASE) MCG/ACT inhaler Inhale 2 puffs into the lungs every 6 (six) hours as needed. Takes for shortness of breath    [provider]  ALPRAZolam (XANAX) 0.5 MG tablet Take 0.5 mg by mouth at bedtime as needed. Takes for depression    [provider]  amLODipine (NORVASC) 10 MG tablet Take 25 mg by mouth daily.     [provider]  atenolol (TENORMIN) 25 MG tablet Take 100 mg by mouth daily.     [provider]  atenolol-chlorthalidone (TENORETIC) 100-25 MG tablet Take by mouth. 10/14/17   [provider]  budesonide-formoterol [facility])  160-4.5 MCG/ACT inhaler Inhale 2 puffs into the lungs 2 (two) times daily.    [provider]  buPROPion (WELLBUTRIN SR) 150 MG 12 hr tablet Take by mouth. 10/14/17   [provider]  cetirizine (ZYRTEC) 10 MG tablet TK 1 T PO DAILY 10/14/17   [provider]  citalopram (CELEXA) 20 MG tablet Take by mouth. 06/11/16   [provider]  cyclobenzaprine (FLEXERIL) 10 MG tablet Take 10 mg by mouth 3 (three) times daily as needed. Takes for muscle spasms    [provider]  diclofenac sodium (VOLTAREN) 1 % GEL Apply 2 g topically 4 (four) times daily.    [provider]  fluticasone (FLONASE) 50 MCG/ACT nasal spray 2 SPRAYS INTO EACH NOSTRIL DAILY 10/14/17   [provider]  furosemide (LASIX) 40 MG tablet Take 1 tablet (40 mg  total) by mouth daily. 05/09/12   Verlee Monte, MD  hydrALAZINE (APRESOLINE) 25 MG tablet Take 100 mg by mouth daily.    [provider]  ipratropium (ATROVENT) 0.06 % nasal spray Place 2 sprays into the nose 4 (four) times daily. 03/04/13   Billy Fischer, MD  losartan (COZAAR) 100 MG tablet TK 1 T PO DAILY 10/14/17   [provider]  montelukast (SINGULAIR) 10 MG tablet Take 10 mg by mouth at bedtime.    [provider]  naproxen (NAPROSYN) 500 MG tablet Take 1 tablet (500 mg total) by mouth 2 (two) times daily. 01/29/18   Merryl Hacker, MD  norethindrone (MICRONOR,CAMILA,ERRIN) 0.35 MG tablet  09/09/17   [provider]  Norgestimate-Ethinyl Estradiol Triphasic (ORTHO TRI-CYCLEN LO) 0.18/0.215/0.25 MG-25 MCG tab Take 1 tablet by mouth daily. Patient not taking: Reported on 07/28/2018 09/03/17   Drenda Freeze, MD  OMEPRAZOLE PO Take 40 mg by mouth daily.     [provider]  oxyCODONE-acetaminophen (PERCOCET) 5-325 MG per tablet Take 1 tablet by mouth every 4 (four) hours as needed for moderate pain. 5/80/99   [name], MD  oxymorphone (OPANA ER) 20 MG 12 hr tablet Take 20 mg by mouth every 12 (twelve) hours.    [provider]  potassium chloride SA (K-DUR,KLOR-CON) 20 MEQ tablet Take 1 tablet (20 mEq total) by mouth daily. 09/03/17   Drenda Freeze, MD  pregabalin (LYRICA) 75 MG capsule Take 75 mg by mouth 3 (three) times daily.    [provider]  rosuvastatin (CRESTOR) 20 MG tablet TK 1 T PO  Q NIGHT 10/14/17   [provider]    Family History Family History  Problem Relation Age of Onset  . Heart attack Mother   . Heart disease Father   . Heart attack Father   . Heart disease Sister     Social History Social History   Tobacco Use  . Smoking status: Former Research scientist (life sciences)  . Smokeless tobacco: Never Used  . Tobacco comment: quit 2004  Substance Use Topics  . Alcohol use: No  . Drug use: No      Allergies   Clindamycin/lincomycin; Ibuprofen; Tomato; and Vicodin [hydrocodone-acetaminophen]   Review of Systems Review of Systems  Genitourinary: Positive for pelvic pain.  All other systems reviewed and are negative.    Physical Exam Updated Vital Signs BP 122/72 (BP Location: Left Arm)   Pulse 75   Temp 98.4 F (36.9 C) (Oral)   Resp 20   Ht 5' (1.524 m)   Wt 128.4 kg   LMP 08/03/2018   SpO2 100%  BMI 55.27 kg/m   Physical Exam Vitals signs and nursing note reviewed.  Constitutional:      General: She is not in acute distress.    Appearance: She is well-developed. She is not diaphoretic.  HENT:     Head: Normocephalic and atraumatic.  Neck:     Musculoskeletal: Normal range of motion and neck supple.  Cardiovascular:     Rate and Rhythm: Normal rate and regular rhythm.     Heart sounds: No murmur. No friction rub. No gallop.   Pulmonary:     Effort: Pulmonary effort is normal. No respiratory distress.     Breath sounds: Normal breath sounds. No wheezing.  Abdominal:     General: Bowel sounds are normal. There is no distension.     Palpations: Abdomen is soft.     Tenderness: There is abdominal tenderness. There is left CVA tenderness. There is no guarding or rebound.     Comments: There is tenderness to palpation in the left lower quadrant and left flank.  Musculoskeletal: Normal range of motion.  Skin:    General: Skin is warm and dry.  Neurological:     Mental Status: She is alert and oriented to person, place, and time.      ED Treatments / Results  Labs (all labs ordered are listed, but only abnormal results are displayed) Labs Reviewed  URINALYSIS, ROUTINE W REFLEX MICROSCOPIC - Abnormal; Notable for the following components:      Result Value   APPearance CLOUDY (*)    Specific Gravity, Urine >1.030 (*)    Protein, ur 30 (*)    All other components within normal limits  URINALYSIS, MICROSCOPIC (REFLEX) - Abnormal; Notable for the following  components:   Bacteria, UA FEW (*)    All other components within normal limits  PREGNANCY, URINE  BASIC METABOLIC PANEL  CBC WITH DIFFERENTIAL/PLATELET    EKG None  Radiology No results found.  Procedures Procedures (including critical care time)  Medications Ordered in ED Medications  morphine 4 MG/ML injection 4 mg (has no administration in time range)  ketorolac (TORADOL) 30 MG/ML injection 30 mg (has no administration in time range)  ondansetron (ZOFRAN) injection 4 mg (has no administration in time range)     Initial Impression / Assessment and Plan / ED Course  I have reviewed the triage vital signs and the nursing notes.  Pertinent labs & imaging results that were available during my care of the patient were reviewed by me and considered in my medical decision making (see chart for details).  Patient presenting here with complaints of left flank pain, the etiology of which appears musculoskeletal.  Urinalysis is clear and CT scan shows no evidence for definite renal calculus.  Patient given medication here in the ER and is feeling better.  There appears to be nothing emergent causing her symptoms and I feel she is appropriate for discharge.  She will be given medicine for her pain and is advised to follow-up as needed if she is not improving.  Final Clinical Impressions(s) / ED Diagnoses   Final diagnoses:  None    ED Discharge Orders    None       Veryl Speak, MD 09/28/18 5631

## 2018-09-28 NOTE — Discharge Instructions (Addendum)
Begin taking ibuprofen 600 mg 3 times daily.  Tramadol as prescribed as needed for pain not relieved with ibuprofen.  Follow-up with your primary doctor if symptoms or not improving in the next 3 to 4 days, and return to the ER if symptoms significantly worsen or change.

## 2018-09-28 NOTE — ED Triage Notes (Signed)
C/o pelvic pain, flank pain, n/v started 2 days ago-NAD-steady gait

## 2018-10-11 ENCOUNTER — Encounter (HOSPITAL_BASED_OUTPATIENT_CLINIC_OR_DEPARTMENT_OTHER): Payer: Self-pay | Admitting: Emergency Medicine

## 2018-10-11 ENCOUNTER — Emergency Department (HOSPITAL_BASED_OUTPATIENT_CLINIC_OR_DEPARTMENT_OTHER)
Admission: EM | Admit: 2018-10-11 | Discharge: 2018-10-11 | Disposition: A | Discharge status: Home / Self Care | Payer: Medicaid Other | Attending: Emergency Medicine | Admitting: Emergency Medicine

## 2018-10-11 ENCOUNTER — Telehealth: Payer: Self-pay

## 2018-10-11 ENCOUNTER — Emergency Department (HOSPITAL_BASED_OUTPATIENT_CLINIC_OR_DEPARTMENT_OTHER): Payer: Medicaid Other

## 2018-10-11 ENCOUNTER — Other Ambulatory Visit: Payer: Self-pay

## 2018-10-11 DIAGNOSIS — J45909 Unspecified asthma, uncomplicated: Secondary | ICD-10-CM | POA: Insufficient documentation

## 2018-10-11 DIAGNOSIS — I10 Essential (primary) hypertension: Secondary | ICD-10-CM | POA: Insufficient documentation

## 2018-10-11 DIAGNOSIS — Z87891 Personal history of nicotine dependence: Secondary | ICD-10-CM | POA: Diagnosis not present

## 2018-10-11 DIAGNOSIS — Z79899 Other long term (current) drug therapy: Secondary | ICD-10-CM | POA: Insufficient documentation

## 2018-10-11 DIAGNOSIS — R079 Chest pain, unspecified: Secondary | ICD-10-CM | POA: Diagnosis present

## 2018-10-11 LAB — CBC
HCT: 38.3 % (ref 36.0–46.0)
Hemoglobin: 12 g/dL (ref 12.0–15.0)
MCH: 28.5 pg (ref 26.0–34.0)
MCHC: 31.3 g/dL (ref 30.0–36.0)
MCV: 91 fL (ref 80.0–100.0)
Platelets: 393 10*3/uL (ref 150–400)
RBC: 4.21 MIL/uL (ref 3.87–5.11)
RDW: 13.7 % (ref 11.5–15.5)
WBC: 10.3 10*3/uL (ref 4.0–10.5)
nRBC: 0 % (ref 0.0–0.2)

## 2018-10-11 LAB — D-DIMER, QUANTITATIVE: D-Dimer, Quant: 0.33 ug/mL-FEU (ref 0.00–0.50)

## 2018-10-11 LAB — BASIC METABOLIC PANEL
Anion gap: 9 (ref 5–15)
BUN: 16 mg/dL (ref 6–20)
CO2: 27 mmol/L (ref 22–32)
Calcium: 9.1 mg/dL (ref 8.9–10.3)
Chloride: 105 mmol/L (ref 98–111)
Creatinine, Ser: 1 mg/dL (ref 0.44–1.00)
GFR calc Af Amer: >60 mL/min (ref >60)
GFR calc non Af Amer: >60 mL/min (ref >60)
Glucose, Bld: 105 mg/dL — ABNORMAL HIGH (ref 70–99)
Potassium: 3.3 mmol/L — ABNORMAL LOW (ref 3.5–5.1)
Sodium: 141 mmol/L (ref 135–145)

## 2018-10-11 LAB — PREGNANCY, URINE: Preg Test, Ur: NEGATIVE

## 2018-10-11 LAB — TROPONIN I
Troponin I: <0.03 ng/mL (ref <0.03)
Troponin I: <0.03 ng/mL (ref <0.03)

## 2018-10-11 MED ORDER — POTASSIUM CHLORIDE ER 10 MEQ PO TBCR: 10.0000 meq | EXTENDED_RELEASE_TABLET | Freq: Every day | ORAL | 0 refills | Status: DC

## 2018-10-11 MED ORDER — FENTANYL CITRATE (PF) 100 MCG/2ML IJ SOLN
50.0000 ug | Freq: Once | INTRAMUSCULAR | Status: AC
  Administered 2018-10-11: 15:00:00 50 ug via INTRAVENOUS
  Filled 2018-10-11: qty 2

## 2018-10-11 MED ORDER — POTASSIUM CHLORIDE CRYS ER 20 MEQ PO TBCR
40.0000 meq | EXTENDED_RELEASE_TABLET | Freq: Once | ORAL | Status: AC
  Administered 2018-10-11: 40 meq via ORAL
  Filled 2018-10-11: qty 2

## 2018-10-11 MED ORDER — ACETAMINOPHEN 500 MG PO TABS
1000.0000 mg | ORAL_TABLET | Freq: Once | ORAL | Status: AC
  Administered 2018-10-11: 1000 mg via ORAL
  Filled 2018-10-11: qty 2

## 2018-10-11 NOTE — ED Notes (Signed)
Patient transported to X-ray 

## 2018-10-11 NOTE — Telephone Encounter (Signed)
Pt called in c/o cp since this morning. Received call from boyfriend stating his sister passed away at 4am and she began having cp. Denies arm pain, sob. Having pain down left side from breast to lower back. Verbally discussed with AK and she said to call MP which I did and he instructed to have pt go to ED. Pt aware.//ah

## 2018-10-11 NOTE — Discharge Instructions (Addendum)
You were seen in the emergency department today for chest pain. Your work-up in the emergency department has been overall reassuring. Your labs have been fairly normal and or similar to previous blood work you have had done.  Your potassium was a bit low at 3.3, we have given you some potassium in the ER and a few days worth of a supplement to take at home as well as diet recommendations.  Your EKG and the enzyme we use to check your heart did not show an acute heart attack at this time. Your chest x-ray was normal.   We would like you to follow up closely with your primary care provider and/or the cardiologist provided in your discharge instructions within 1-3 days. Return to the ER immediately should you experience any new or worsening symptoms including but not limited to return of pain, worsened pain, vomiting, shortness of breath, dizziness, lightheadedness, passing out, or any other concerns that you may have.

## 2018-10-11 NOTE — ED Notes (Signed)
ED Provider at bedside. 

## 2018-10-11 NOTE — ED Provider Notes (Signed)
[facility] EMERGENCY DEPARTMENT Provider Note   CSN: [id number] Arrival date & time: 10/11/18  1434     History   Chief Complaint Chief Complaint  Patient presents with   Chest Pain    HPI Felicia Molina is a 48 y.o. female w/ a hx of morbid obesity, HTN, panic attacks, fibromyalgia, asthma, GERD, & RA not on immunosuppressants who presents to the ED w/ complaints of chest pain that began 4 hours PTA. Patient states she was sitting at rest on the phone with her significant other who informed her of a death in the family when she developed onset of chest discomfort, palpitations, dyspnea, diaphoresis, & anxiety. She notes chest discomfort to be sharp L sided w/ radiating into the L arm, somewhat worse with a deep breath, otherwise no alleviating/aggravating factors. No change with exertion. No meds PTA. Does not necessarily have a hx of similar. Denies fever, chills, nausea, vomiting, syncope, leg pain/swelling, hemoptysis, recent surgery/trauma, recent long travel, personal hx of cancer, or hx of DVT/PE. Family hx of VTE. Does utilize hormones. Denies hx of CAD.     HPI  Past Medical History:  Diagnosis Date   Asthma    Atypical chest pain 07/28/2018   Back pain, chronic    Fibromyalgia    Hypertension    Morbid obesity ([place])    Neurogenic bladder    Pain management contract signed    Palpitations 07/28/2018   Panic attacks    Reflux     Patient Active Problem List   Diagnosis Date Noted   Atypical chest pain 07/28/2018   Palpitations 07/28/2018   Right wrist pain 11/08/2017   Asthma 10/14/2017   Depression 10/14/2017   GERD (gastroesophageal reflux disease) 10/14/2017   Fibroid 09/09/2017   Morbid obesity with BMI of 50.0-59.9, adult ([place]) 08/05/2016   Rheumatoid arthritis ([place]) 08/05/2016   Chronic bilateral low back pain without sciatica 05/13/2015   Plantar fasciitis, bilateral 07/19/2012   Metatarsalgia of both feet 07/19/2012    Acute respiratory failure ([place]) 05/07/2012   Hypertension    CAP (community acquired pneumonia) 05/06/2012    Past Surgical History:  Procedure Laterality Date   BACK SURGERY     bladder stimulator     CARPAL TUNNEL RELEASE     TONSILLECTOMY     URETERAL STENT PLACEMENT  2008     OB History    Gravida  6   Para  4   Term  4   Preterm  0   AB  2   Living  4     SAB  2   TAB  0   Ectopic  0   Multiple  0   Live Births  4            Home Medications    Prior to Admission medications   Medication Sig Start Date End Date Taking? Authorizing Provider  albuterol (PROVENTIL HFA;VENTOLIN HFA) 108 (90 BASE) MCG/ACT inhaler Inhale 2 puffs into the lungs every 6 (six) hours as needed. Takes for shortness of breath    [provider]  ALPRAZolam (XANAX) 0.5 MG tablet Take 0.5 mg by mouth at bedtime as needed. Takes for depression    [provider]  amLODipine (NORVASC) 10 MG tablet Take 25 mg by mouth daily.     [provider]  atenolol (TENORMIN) 25 MG tablet Take 100 mg by mouth daily.     [provider]  atenolol-chlorthalidone (TENORETIC) 100-25 MG tablet Take  by mouth. 10/14/17   [provider]  budesonide-formoterol (SYMBICORT) 160-4.5 MCG/ACT inhaler Inhale 2 puffs into the lungs 2 (two) times daily.    [provider]  buPROPion (WELLBUTRIN SR) 150 MG 12 hr tablet Take by mouth. 10/14/17   [provider]  cetirizine (ZYRTEC) 10 MG tablet TK 1 T PO DAILY 10/14/17   [provider]  citalopram (CELEXA) 20 MG tablet Take by mouth. 06/11/16   [provider]  cyclobenzaprine (FLEXERIL) 10 MG tablet Take 10 mg by mouth 3 (three) times daily as needed. Takes for muscle spasms    [provider]  diclofenac sodium (VOLTAREN) 1 % GEL Apply 2 g topically 4 (four) times daily.    [provider]  fluticasone (FLONASE) 50 MCG/ACT nasal spray 2 SPRAYS INTO EACH NOSTRIL  DAILY 10/14/17   [provider]  furosemide (LASIX) 40 MG tablet Take 1 tablet (40 mg total) by mouth daily. 05/09/12   Verlee Monte, MD  hydrALAZINE (APRESOLINE) 25 MG tablet Take 100 mg by mouth daily.    [provider]  ipratropium (ATROVENT) 0.06 % nasal spray Place 2 sprays into the nose 4 (four) times daily. 03/04/13   Billy Fischer, MD  losartan (COZAAR) 100 MG tablet TK 1 T PO DAILY 10/14/17   [provider]  montelukast (SINGULAIR) 10 MG tablet Take 10 mg by mouth at bedtime.    [provider]  naproxen (NAPROSYN) 500 MG tablet Take 1 tablet (500 mg total) by mouth 2 (two) times daily. 01/29/18   Merryl Hacker, MD  norethindrone (MICRONOR,CAMILA,ERRIN) 0.35 MG tablet  09/09/17   [provider]  Norgestimate-Ethinyl Estradiol Triphasic (ORTHO TRI-CYCLEN LO) 0.18/0.215/0.25 MG-25 MCG tab Take 1 tablet by mouth daily. Patient not taking: Reported on 07/28/2018 09/03/17   Drenda Freeze, MD  OMEPRAZOLE PO Take 40 mg by mouth daily.     [provider]  oxyCODONE-acetaminophen (PERCOCET) 5-325 MG per tablet Take 1 tablet by mouth every 4 (four) hours as needed for moderate pain. 7/32/20   [name], MD  oxymorphone (OPANA ER) 20 MG 12 hr tablet Take 20 mg by mouth every 12 (twelve) hours.    [provider]  potassium chloride SA (K-DUR,KLOR-CON) 20 MEQ tablet Take 1 tablet (20 mEq total) by mouth daily. 09/03/17   Drenda Freeze, MD  pregabalin (LYRICA) 75 MG capsule Take 75 mg by mouth 3 (three) times daily.    [provider]  rosuvastatin (CRESTOR) 20 MG tablet TK 1 T PO  Q NIGHT 10/14/17   [provider]  traMADol (ULTRAM) 50 MG tablet Take 1 tablet (50 mg total) by mouth every 6 (six) hours as needed. 09/28/18   Veryl Speak, MD    Family History Family History  Problem Relation Age of Onset   Heart attack Mother    Heart disease Father    Heart attack Father    Heart disease Sister      Social History Social History   Tobacco Use   Smoking status: Former Smoker   Smokeless tobacco: Never Used   Tobacco comment: quit 2004  Substance Use Topics   Alcohol use: No   Drug use: No     Allergies   Clindamycin/lincomycin, Ibuprofen, Tomato, and Vicodin [hydrocodone-acetaminophen]   Review of Systems Review of Systems  Constitutional: Positive for diaphoresis. Negative for chills and fever.  Respiratory: Positive for shortness of breath. Negative for cough.   Cardiovascular: Positive for chest  pain and palpitations. Negative for leg swelling.  Gastrointestinal: Negative for abdominal pain, nausea and vomiting.  Neurological: Negative for weakness and numbness.  Psychiatric/Behavioral: The patient is nervous/anxious.   All other systems reviewed and are negative.  Physical Exam Updated Vital Signs Ht 4\' 8"  (1.422 m)    Wt 127.5 kg    LMP 09/02/2018    BMI 63.00 kg/m   Physical Exam Vitals signs and nursing note reviewed.  Constitutional:      General: She is not in acute distress.    Appearance: She is well-developed. She is obese. She is not toxic-appearing.  HENT:     Head: Normocephalic and atraumatic.  Eyes:     General:        Right eye: No discharge.        Left eye: No discharge.     Conjunctiva/sclera: Conjunctivae normal.  Neck:     Musculoskeletal: Neck supple.  Cardiovascular:     Rate and Rhythm: Normal rate and regular rhythm.     Pulses:          Dorsalis pedis pulses are 2+ on the right side and 2+ on the left side.  Pulmonary:     Effort: Pulmonary effort is normal. No respiratory distress.     Breath sounds: Normal breath sounds. No wheezing, rhonchi or rales.  Chest:     Chest wall: Tenderness (left anterior chest wall) present. No deformity or crepitus.  Abdominal:     General: There is no distension.     Palpations: Abdomen is soft.     Tenderness: There is no abdominal tenderness.  Musculoskeletal:     Right lower  leg: No edema.     Left lower leg: No edema.  Skin:    General: Skin is warm and dry.     Findings: No rash.  Neurological:     General: No focal deficit present.     Mental Status: She is alert.     Comments: Clear speech.   Psychiatric:        Mood and Affect: Mood is anxious (mild).        Behavior: Behavior normal.    ED Treatments / Results  Labs (all labs ordered are listed, but only abnormal results are displayed) Labs Reviewed  BASIC METABOLIC PANEL - Abnormal; Notable for the following components:      Result Value   Potassium 3.3 (*)    Glucose, Bld 105 (*)    All other components within normal limits  CBC  D-DIMER, QUANTITATIVE (NOT AT [facility])  TROPONIN I  PREGNANCY, URINE  TROPONIN I    EKG   EKG Interpretation  Date/Time:  Wednesday October 11 2018 14:43:38 EDT Ventricular Rate:  78 PR Interval:    QRS Duration: 87 QT Interval:  419 QTC Calculation: 478 R Axis:   20 Text Interpretation:  Sinus rhythm Low voltage, precordial leads No significant change was found Confirmed by Jola Schmidt [id number]) on 10/11/2018 2:46:22 PM   Radiology No results found.  Procedures Procedures (including critical care time)  Medications Ordered in ED Medications  fentaNYL (SUBLIMAZE) injection 50 mcg (50 mcg Intravenous Given 10/11/18 1519)  acetaminophen (TYLENOL) tablet 1,000 mg (1,000 mg Oral Given 10/11/18 1732)     Initial Impression / Assessment and Plan / ED Course  I have reviewed the triage vital signs and the nursing notes.  Pertinent labs & imaging results that were available during my care of the patient were reviewed by me and  considered in my medical decision making (see chart for details).    Patient presents to the emergency department with chest pain. Patient nontoxic appearing, in no apparent distress, vitals without significant abnormality. Fairly benign physical exam, does have some left anterior chest wall tenderness w/o overlying rashes or palpable  crepitus. DDX: ACS, pulmonary embolism, dissection, pneumothorax, effusion, infiltrate, arrhythmia, anemia, electrolyte derangement, MSK, GERD, anxiety. Evaluation initiated with labs, EKG, and CXR. Patient on cardiac monitor.   Work-up in the ER reviewed:  CBC: No anemia/leukocytosis.  BMP: No significant electrolyte derangement, mild hypokalemia @ 3.3.  Troponin: x 2 WNL D-dimer: WNL EKG: No significant change from prior.  CXR: Negative, without infiltrate, effusion, pneumothorax, or fracture/dislocation.   Low risk heart score, EKG without obvious ischemia, delta troponin negative, doubt ACS. Patient is low risk wells, d-dimer negative, doubt pulmonary embolism. Pain is not a tearing sensation, symmetric pulses, no widening of mediastinum on CXR, doubt dissection. Cardiac monitor reviewed, no notable arrhythmias or tachycardia. Patient has appeared hemodynamically stable throughout ER visit and appears safe for discharge with close PCP/cardiology follow up. Unclear definitive etiology to her sxs, suspect anxiety may be a factor given sxs onset upon hearing about the death of someone she is close with. I discussed results, treatment plan, need for follow-up, and return precautions with the patient. Provided opportunity for questions, patient confirmed understanding and is in agreement with plan.   Vitals:   10/11/18 1523 10/11/18 1723  BP: 98/63 93/69  Pulse: 72 72  Resp: 16 18  Temp:    SpO2: 100% 97%    Final Clinical Impressions(s) / ED Diagnoses   Final diagnoses:  Chest pain, unspecified type    ED Discharge Orders         Ordered    potassium chloride (K-DUR) 10 MEQ tablet  Daily     10/11/18 1833           Amaryllis Dyke, PA-C 10/11/18 1834    Blanchie Dessert, MD 10/11/18 2248

## 2018-10-11 NOTE — ED Notes (Signed)
Pt verbalized understanding of dc instructions.

## 2018-10-11 NOTE — ED Triage Notes (Signed)
Left sided chest pain radiating to left arm with sweating and SOB. Started 4 hours ago.

## 2018-11-17 ENCOUNTER — Encounter (HOSPITAL_BASED_OUTPATIENT_CLINIC_OR_DEPARTMENT_OTHER): Payer: Self-pay | Admitting: *Deleted

## 2018-11-17 ENCOUNTER — Emergency Department (HOSPITAL_BASED_OUTPATIENT_CLINIC_OR_DEPARTMENT_OTHER)
Admission: EM | Admit: 2018-11-17 | Discharge: 2018-11-17 | Disposition: A | Discharge status: Home / Self Care | Payer: Medicaid Other | Attending: Emergency Medicine | Admitting: Emergency Medicine

## 2018-11-17 ENCOUNTER — Other Ambulatory Visit: Payer: Self-pay

## 2018-11-17 DIAGNOSIS — I10 Essential (primary) hypertension: Secondary | ICD-10-CM | POA: Insufficient documentation

## 2018-11-17 DIAGNOSIS — J45909 Unspecified asthma, uncomplicated: Secondary | ICD-10-CM | POA: Diagnosis not present

## 2018-11-17 DIAGNOSIS — Z87891 Personal history of nicotine dependence: Secondary | ICD-10-CM | POA: Diagnosis not present

## 2018-11-17 DIAGNOSIS — Z79899 Other long term (current) drug therapy: Secondary | ICD-10-CM | POA: Insufficient documentation

## 2018-11-17 DIAGNOSIS — N898 Other specified noninflammatory disorders of vagina: Secondary | ICD-10-CM | POA: Insufficient documentation

## 2018-11-17 DIAGNOSIS — R300 Dysuria: Secondary | ICD-10-CM | POA: Diagnosis present

## 2018-11-17 LAB — URINALYSIS, ROUTINE W REFLEX MICROSCOPIC
Bilirubin Urine: NEGATIVE
Glucose, UA: NEGATIVE mg/dL
Hgb urine dipstick: NEGATIVE
Ketones, ur: NEGATIVE mg/dL
Leukocytes,Ua: NEGATIVE
Nitrite: NEGATIVE
Protein, ur: NEGATIVE mg/dL
Specific Gravity, Urine: >1.03 — ABNORMAL HIGH (ref 1.005–1.030)
pH: 6 (ref 5.0–8.0)

## 2018-11-17 LAB — WET PREP, GENITAL
Clue Cells Wet Prep HPF POC: NONE SEEN
Sperm: NONE SEEN
Trich, Wet Prep: NONE SEEN
WBC, Wet Prep HPF POC: NONE SEEN
Yeast Wet Prep HPF POC: NONE SEEN

## 2018-11-17 LAB — PREGNANCY, URINE: Preg Test, Ur: NEGATIVE

## 2018-11-17 MED ORDER — FLUCONAZOLE 200 MG PO TABS: ORAL_TABLET | ORAL | 0 refills | Status: DC

## 2018-11-17 MED FILL — FLUCONAZOLE 200 MG TAB: 200 | 2 days supply | Qty: 2 | Fill #0

## 2018-11-17 NOTE — ED Triage Notes (Signed)
She has a bladder stimulator. She was given Diflucan a week ago. She is having burning in her vaginal area and when she urinates.

## 2018-11-17 NOTE — ED Notes (Signed)
Patient stated that she has some  foul-smelling, white vaginal discharges.

## 2018-11-17 NOTE — Discharge Instructions (Signed)
Please take medication as prescribed Please follow up with your PCP  You will receive a call in 2-3 days if you test positive for gonorrhea or chlamydia. If you do not get a call assume that it is negative

## 2018-11-17 NOTE — ED Provider Notes (Signed)
[facility] Provider Note   CSN: [id number] Arrival date & time: 11/17/18  1325    History   Chief Complaint No chief complaint on file.   HPI Felicia Molina is a 48 y.o. female who presents to the ED today complaining of dysuria as well as a burning sensation to her vaginal area x 1 week. Pt reports she thought she had a yeast infection so she called her PCP who prescribed diflucan. She reports she took it without relief of her symptoms, prompting her to come to the ED today. Pt is sexually active with 1 female partner; they do not use protection. She is not concerned about STDs today. Unsure about chance of pregnancy. Denies fever, chills, vaginal discharge, abdominal pain, pelvic pain, nausea, vomiting, urinary frequency, or any other associated symptoms.        Past Medical History:  Diagnosis Date  . Asthma   . Atypical chest pain 07/28/2018  . Back pain, chronic   . Fibromyalgia   . Hypertension   . Morbid obesity ([place])   . Neurogenic bladder   . Pain management contract signed   . Palpitations 07/28/2018  . Panic attacks   . Reflux     Patient Active Problem List   Diagnosis Date Noted  . Atypical chest pain 07/28/2018  . Palpitations 07/28/2018  . Right wrist pain 11/08/2017  . Asthma 10/14/2017  . Depression 10/14/2017  . GERD (gastroesophageal reflux disease) 10/14/2017  . Fibroid 09/09/2017  . Morbid obesity with BMI of 50.0-59.9, adult ([place]) 08/05/2016  . Rheumatoid arthritis ([place]) 08/05/2016  . Chronic bilateral low back pain without sciatica 05/13/2015  . Plantar fasciitis, bilateral 07/19/2012  . Metatarsalgia of both feet 07/19/2012  . Acute respiratory failure ([place]) 05/07/2012  . Hypertension   . CAP (community acquired pneumonia) 05/06/2012    Past Surgical History:  Procedure Laterality Date  . BACK SURGERY    . bladder stimulator    . CARPAL TUNNEL RELEASE    . TONSILLECTOMY    . URETERAL STENT PLACEMENT  2008     OB History    Gravida  6   Para  4   Term  4   Preterm  0   AB  2   Living  4     SAB  2   TAB  0   Ectopic  0   Multiple  0   Live Births  4            Home Medications    Prior to Admission medications   Medication Sig Start Date End Date Taking? Authorizing Provider  albuterol (PROVENTIL HFA;VENTOLIN HFA) 108 (90 BASE) MCG/ACT inhaler Inhale 2 puffs into the lungs every 6 (six) hours as needed. Takes for shortness of breath    [provider]  ALPRAZolam (XANAX) 0.5 MG tablet Take 0.5 mg by mouth at bedtime as needed. Takes for depression    [provider]  amLODipine (NORVASC) 10 MG tablet Take 25 mg by mouth daily.     [provider]  atenolol (TENORMIN) 25 MG tablet Take 100 mg by mouth daily.     [provider]  atenolol-chlorthalidone (TENORETIC) 100-25 MG tablet Take by mouth. 10/14/17   [provider]  budesonide-formoterol (SYMBICORT) 160-4.5 MCG/ACT inhaler Inhale 2 puffs into the lungs 2 (two) times daily.    [provider]  buPROPion [facility] SR) 150 MG 12 hr tablet Take by mouth. 10/14/17   [provider]  cetirizine (ZYRTEC) 10 MG tablet TK 1 T PO DAILY 10/14/17   [provider]  citalopram (CELEXA) 20 MG tablet Take by mouth. 06/11/16   [provider]  cyclobenzaprine (FLEXERIL) 10 MG tablet Take 10 mg by mouth 3 (three) times daily as needed. Takes for muscle spasms    [provider]  diclofenac sodium (VOLTAREN) 1 % GEL Apply 2 g topically 4 (four) times daily.    [provider]  fluconazole (DIFLUCAN) 200 MG tablet Take 1 tablet by mouth and if you are still having symptoms take the other tablet 3 days later 11/17/18   Eustaquio Maize, PA-C  fluticasone [facility]) 50 MCG/ACT nasal spray 2 SPRAYS INTO EACH NOSTRIL DAILY 10/14/17   [provider]  furosemide (LASIX) 40 MG tablet Take 1 tablet (40 mg total) by mouth daily. 05/09/12    Verlee Monte, MD  hydrALAZINE (APRESOLINE) 25 MG tablet Take 100 mg by mouth daily.    [provider]  ipratropium (ATROVENT) 0.06 % nasal spray Place 2 sprays into the nose 4 (four) times daily. 03/04/13   Billy Fischer, MD  losartan (COZAAR) 100 MG tablet TK 1 T PO DAILY 10/14/17   [provider]  montelukast (SINGULAIR) 10 MG tablet Take 10 mg by mouth at bedtime.    [provider]  naproxen (NAPROSYN) 500 MG tablet Take 1 tablet (500 mg total) by mouth 2 (two) times daily. 01/29/18   Merryl Hacker, MD  norethindrone (MICRONOR,CAMILA,ERRIN) 0.35 MG tablet  09/09/17   [provider]  Norgestimate-Ethinyl Estradiol Triphasic (ORTHO TRI-CYCLEN LO) 0.18/0.215/0.25 MG-25 MCG tab Take 1 tablet by mouth daily. Patient not taking: Reported on 07/28/2018 09/03/17   Drenda Freeze, MD  OMEPRAZOLE PO Take 40 mg by mouth daily.     [provider]  oxyCODONE-acetaminophen (PERCOCET) 5-325 MG per tablet Take 1 tablet by mouth every 4 (four) hours as needed for moderate pain. 08/01/12   [name], MD  oxymorphone (OPANA ER) 20 MG 12 hr tablet Take 20 mg by mouth every 12 (twelve) hours.    [provider]  potassium chloride (K-DUR) 10 MEQ tablet Take 1 tablet (10 mEq total) by mouth daily. 10/11/18   Petrucelli, Samantha R, PA-C  potassium chloride SA (K-DUR,KLOR-CON) 20 MEQ tablet Take 1 tablet (20 mEq total) by mouth daily. 09/03/17   Drenda Freeze, MD  pregabalin (LYRICA) 75 MG capsule Take 75 mg by mouth 3 (three) times daily.    [provider]  rosuvastatin (CRESTOR) 20 MG tablet TK 1 T PO  Q NIGHT 10/14/17   [provider]  traMADol (ULTRAM) 50 MG tablet Take 1 tablet (50 mg total) by mouth every 6 (six) hours as needed. 09/28/18   Veryl Speak, MD    Family History Family History  Problem Relation Age of Onset  . Heart attack Mother   . Heart disease Father   . Heart attack Father   . Heart disease Sister      Social History Social History   Tobacco Use  . Smoking status: Former Research scientist (life sciences)  . Smokeless tobacco: Never Used  . Tobacco comment: quit 2004  Substance Use Topics  . Alcohol use: No  . Drug use: No     Allergies   Clindamycin/lincomycin, Ibuprofen, Tomato, and Vicodin [hydrocodone-acetaminophen]   Review of Systems Review of Systems  Constitutional: Negative for chills and fever.  HENT: Negative for congestion.   Eyes: Negative for visual disturbance.  Respiratory: Negative for cough and shortness of breath.   Cardiovascular: Negative for chest pain.  Gastrointestinal: Negative for abdominal pain, nausea and vomiting.  Genitourinary: Positive for dysuria. Negative for frequency, vaginal discharge and vaginal pain.  Musculoskeletal: Negative for myalgias.  Skin: Negative for rash.  Neurological: Negative for headaches.     Physical Exam Updated Vital Signs BP 102/80   Pulse 80   Temp 98.5 F (36.9 C) (Oral)   Resp 20   Ht 5' (1.524 m)   Wt 127.5 kg   SpO2 100%   BMI 54.90 kg/m   Physical Exam Vitals signs and nursing note reviewed.  Constitutional:      Appearance: She is not ill-appearing.  HENT:     Head: Normocephalic and atraumatic.  Eyes:     Conjunctiva/sclera: Conjunctivae normal.  Neck:     Musculoskeletal: Neck supple.  Cardiovascular:     Rate and Rhythm: Normal rate and regular rhythm.  Pulmonary:     Effort: Pulmonary effort is normal.     Breath sounds: Normal breath sounds.  Abdominal:     Palpations: Abdomen is soft.     Tenderness: There is no abdominal tenderness.  Genitourinary:    Comments: Chaperone present for exam. No rashes, lesions, or tenderness to external genitalia. No erythema, injury, or tenderness to vaginal mucosa.White thick vaginal discharge noted to vault. No adnexal masses, tenderness, or fullness. No CMT, cervical friability, or discharge from cervical os. Cervical os is closed. Uterus non-deviated, mobile, nonTTP,  and without enlargement.   Skin:    General: Skin is warm and dry.  Neurological:     Mental Status: She is alert.      ED Treatments / Results  Labs (all labs ordered are listed, but only abnormal results are displayed) Labs Reviewed  URINALYSIS, ROUTINE W REFLEX MICROSCOPIC - Abnormal; Notable for the following components:      Result Value   APPearance HAZY (*)    Specific Gravity, Urine >1.030 (*)    All other components within normal limits  WET PREP, GENITAL  PREGNANCY, URINE  RPR  HIV ANTIBODY (ROUTINE TESTING W REFLEX)  GC/CHLAMYDIA PROBE AMP ([place]) NOT AT [facility]    EKG None  Radiology No results found.  Procedures Procedures (including critical care time)  Medications Ordered in ED Medications - No data to display   Initial Impression / Assessment and Plan / ED Course  I have reviewed the triage vital signs and the nursing notes.  Pertinent labs & imaging results that were available during my care of the patient were reviewed by me and considered in my medical decision making (see chart for details).    48 year old female with dysuria and vaginal burning. Recently treated with diflucan without improvement. No concerning sx including abd pain, pelvic pain, nausea, vomiting, fevers. Pt denies chance of pregnancy although she is sexually active with her children's father. Wet prep performed; white thick vaginal discharge appreciated. Suspect yeast infection at this time but will await results. GC/chlamydia pending.   Wet prep negative at this time although physical exam consistent for yeast infection. Will treat again today with diflucan. Advised to follow up with PCP. She is aware she will get a call in 2-3 days if she tests positive for GC/chlamydia. Pt is in agreement with plan at this time and stable for discharge home.        Final Clinical Impressions(s) / ED Diagnoses   Final diagnoses:  Vaginal itching  ED Discharge Orders          Ordered    fluconazole (DIFLUCAN) 200 MG tablet     11/17/18 1534           Eustaquio Maize, PA-C 11/17/18 2142    Quintella Reichert, MD 11/18/18 1226

## 2018-11-18 LAB — HIV ANTIBODY (ROUTINE TESTING W REFLEX): HIV Screen 4th Generation wRfx: NONREACTIVE

## 2018-11-18 LAB — RPR: RPR Ser Ql: NONREACTIVE

## 2018-11-21 LAB — GC/CHLAMYDIA PROBE AMP (~~LOC~~) NOT AT ARMC
Chlamydia: NEGATIVE
Neisseria Gonorrhea: NEGATIVE

## 2019-01-05 ENCOUNTER — Other Ambulatory Visit: Payer: Self-pay

## 2019-01-05 ENCOUNTER — Encounter (HOSPITAL_BASED_OUTPATIENT_CLINIC_OR_DEPARTMENT_OTHER): Payer: Self-pay

## 2019-01-05 DIAGNOSIS — Z881 Allergy status to other antibiotic agents status: Secondary | ICD-10-CM | POA: Insufficient documentation

## 2019-01-05 DIAGNOSIS — W57XXXA Bitten or stung by nonvenomous insect and other nonvenomous arthropods, initial encounter: Secondary | ICD-10-CM | POA: Insufficient documentation

## 2019-01-05 DIAGNOSIS — Y9384 Activity, sleeping: Secondary | ICD-10-CM | POA: Diagnosis not present

## 2019-01-05 DIAGNOSIS — Z79899 Other long term (current) drug therapy: Secondary | ICD-10-CM | POA: Diagnosis not present

## 2019-01-05 DIAGNOSIS — J45909 Unspecified asthma, uncomplicated: Secondary | ICD-10-CM | POA: Diagnosis not present

## 2019-01-05 DIAGNOSIS — M797 Fibromyalgia: Secondary | ICD-10-CM | POA: Insufficient documentation

## 2019-01-05 DIAGNOSIS — Y999 Unspecified external cause status: Secondary | ICD-10-CM | POA: Diagnosis not present

## 2019-01-05 DIAGNOSIS — Z886 Allergy status to analgesic agent status: Secondary | ICD-10-CM | POA: Diagnosis not present

## 2019-01-05 DIAGNOSIS — Y92003 Bedroom of unspecified non-institutional (private) residence as the place of occurrence of the external cause: Secondary | ICD-10-CM | POA: Insufficient documentation

## 2019-01-05 DIAGNOSIS — S70262A Insect bite (nonvenomous), left hip, initial encounter: Secondary | ICD-10-CM | POA: Diagnosis not present

## 2019-01-05 DIAGNOSIS — I10 Essential (primary) hypertension: Secondary | ICD-10-CM | POA: Diagnosis not present

## 2019-01-05 NOTE — ED Triage Notes (Signed)
Pt c/o ?insect bite to left thigh x 3 days-states area is red and draining-NAD-steady gait

## 2019-01-06 ENCOUNTER — Emergency Department (HOSPITAL_BASED_OUTPATIENT_CLINIC_OR_DEPARTMENT_OTHER)
Admission: EM | Admit: 2019-01-06 | Discharge: 2019-01-06 | Disposition: A | Discharge status: Home / Self Care | Payer: Medicaid Other | Attending: Emergency Medicine | Admitting: Emergency Medicine

## 2019-01-06 DIAGNOSIS — W57XXXA Bitten or stung by nonvenomous insect and other nonvenomous arthropods, initial encounter: Secondary | ICD-10-CM

## 2019-01-06 LAB — PREGNANCY, URINE: Preg Test, Ur: NEGATIVE

## 2019-01-06 MED ORDER — DOXYCYCLINE HYCLATE 100 MG PO CAPS: 100.0000 mg | ORAL_CAPSULE | Freq: Two times a day (BID) | ORAL | 0 refills | Status: DC

## 2019-01-06 MED ORDER — DOXYCYCLINE HYCLATE 100 MG PO TABS
100.0000 mg | ORAL_TABLET | Freq: Once | ORAL | Status: AC
  Administered 2019-01-06: 100 mg via ORAL
  Filled 2019-01-06: qty 1

## 2019-01-06 MED ORDER — MUPIROCIN CALCIUM 2 % EX CREA
TOPICAL_CREAM | Freq: Three times a day (TID) | CUTANEOUS | Status: DC
  Filled 2019-01-06: qty 15

## 2019-01-06 NOTE — ED Provider Notes (Signed)
[facility] Provider Note: Georgena Spurling, MD, FACEP  CSN: [id number] MRN: [id number] ARRIVAL: 01/05/19 at 2256 ROOM: [facility]   HISTORY OF PRESENT ILLNESS  01/06/19 1:48 AM Felicia Molina is a 48 y.o. female who was sleeping 2 or 3 nights ago and felt something sting or bite her left hip.  She slapped it.  It subsequently developed a blister which is broken down.  There is now an oozing sore at the site.  She rates associated pain is a 9 out of 10, worse with palpation.  She has not had a fever with this.  She has not taken anything for this.   Past Medical History:  Diagnosis Date  . Asthma   . Atypical chest pain 07/28/2018  . Back pain, chronic   . Fibromyalgia   . Hypertension   . Morbid obesity ([place])   . Neurogenic bladder   . Pain management contract signed   . Palpitations 07/28/2018  . Panic attacks   . Reflux     Past Surgical History:  Procedure Laterality Date  . BACK SURGERY    . bladder stimulator    . CARPAL TUNNEL RELEASE    . TONSILLECTOMY    . URETERAL STENT PLACEMENT  2008    Family History  Problem Relation Age of Onset  . Heart attack Mother   . Heart disease Father   . Heart attack Father   . Heart disease Sister     Social History   Tobacco Use  . Smoking status: Former Research scientist (life sciences)  . Smokeless tobacco: Never Used  . Tobacco comment: quit 2004  Substance Use Topics  . Alcohol use: No  . Drug use: No    Prior to Admission medications   Medication Sig Start Date End Date Taking? Authorizing Provider  albuterol (PROVENTIL HFA;VENTOLIN HFA) 108 (90 BASE) MCG/ACT inhaler Inhale 2 puffs into the lungs every 6 (six) hours as needed. Takes for shortness of breath    [provider]  ALPRAZolam (XANAX) 0.5 MG tablet Take 0.5 mg by mouth at bedtime as needed. Takes for depression    [provider]  amLODipine (NORVASC) 10 MG tablet Take 25 mg by mouth daily.     [provider]   atenolol (TENORMIN) 25 MG tablet Take 100 mg by mouth daily.     [provider]  atenolol-chlorthalidone (TENORETIC) 100-25 MG tablet Take by mouth. 10/14/17   [provider]  budesonide-formoterol (SYMBICORT) 160-4.5 MCG/ACT inhaler Inhale 2 puffs into the lungs 2 (two) times daily.    [provider]  buPROPion (WELLBUTRIN SR) 150 MG 12 hr tablet Take by mouth. 10/14/17   [provider]  cetirizine (ZYRTEC) 10 MG tablet TK 1 T PO DAILY 10/14/17   [provider]  citalopram (CELEXA) 20 MG tablet Take by mouth. 06/11/16   [provider]  cyclobenzaprine (FLEXERIL) 10 MG tablet Take 10 mg by mouth 3 (three) times daily as needed. Takes for muscle spasms    [provider]  diclofenac sodium (VOLTAREN) 1 % GEL Apply 2 g topically 4 (four) times daily.    [provider]  fluconazole (DIFLUCAN) 200 MG tablet Take 1 tablet by mouth and if you are still having symptoms take the other tablet 3 days later 11/17/18   Eustaquio Maize, PA-C  fluticasone (FLONASE) 50 MCG/ACT nasal spray 2 SPRAYS INTO EACH NOSTRIL DAILY 10/14/17   [provider]  furosemide (LASIX)  40 MG tablet Take 1 tablet (40 mg total) by mouth daily. 05/09/12   Verlee Monte, MD  hydrALAZINE (APRESOLINE) 25 MG tablet Take 100 mg by mouth daily.    [provider]  ipratropium (ATROVENT) 0.06 % nasal spray Place 2 sprays into the nose 4 (four) times daily. 03/04/13   Billy Fischer, MD  losartan (COZAAR) 100 MG tablet TK 1 T PO DAILY 10/14/17   [provider]  montelukast (SINGULAIR) 10 MG tablet Take 10 mg by mouth at bedtime.    [provider]  naproxen (NAPROSYN) 500 MG tablet Take 1 tablet (500 mg total) by mouth 2 (two) times daily. 01/29/18   Merryl Hacker, MD  norethindrone (MICRONOR,CAMILA,ERRIN) 0.35 MG tablet  09/09/17   [provider]  Norgestimate-Ethinyl Estradiol Triphasic (ORTHO TRI-CYCLEN LO)  0.18/0.215/0.25 MG-25 MCG tab Take 1 tablet by mouth daily. Patient not taking: Reported on 07/28/2018 09/03/17   Drenda Freeze, MD  OMEPRAZOLE PO Take 40 mg by mouth daily.     [provider]  oxyCODONE-acetaminophen (PERCOCET) 5-325 MG per tablet Take 1 tablet by mouth every 4 (four) hours as needed for moderate pain. [id number]   [name], MD  oxymorphone (OPANA ER) 20 MG 12 hr tablet Take 20 mg by mouth every 12 (twelve) hours.    [provider]  potassium chloride (K-DUR) 10 MEQ tablet Take 1 tablet (10 mEq total) by mouth daily. 10/11/18   Petrucelli, Samantha R, PA-C  potassium chloride SA (K-DUR,KLOR-CON) 20 MEQ tablet Take 1 tablet (20 mEq total) by mouth daily. 09/03/17   Drenda Freeze, MD  pregabalin (LYRICA) 75 MG capsule Take 75 mg by mouth 3 (three) times daily.    [provider]  rosuvastatin (CRESTOR) 20 MG tablet TK 1 T PO  Q NIGHT 10/14/17   [provider]  traMADol (ULTRAM) 50 MG tablet Take 1 tablet (50 mg total) by mouth every 6 (six) hours as needed. 09/28/18   Veryl Speak, MD    Allergies Clindamycin/lincomycin, Ibuprofen, Tomato, and Vicodin [hydrocodone-acetaminophen]   REVIEW OF SYSTEMS  Negative except as noted here or in the History of Present Illness.   PHYSICAL EXAMINATION  Initial Vital Signs Blood pressure 137/86, pulse 90, temperature 98.7 F (37.1 C), temperature source Oral, resp. rate 20, height 5' (1.524 m), weight 132.9 kg, last menstrual period 12/03/2018, SpO2 100 %.  Examination General: Well-developed, obese female in no acute distress; appearance consistent with age of record HENT: normocephalic; atraumatic Eyes: Normal appearance Neck: supple Heart: regular rate and rhythm Lungs: clear to auscultation bilaterally Abdomen: soft; nondistended; nontender; bowel sounds present Extremities: No deformity; full range of motion Neurologic: Awake, alert and oriented; motor function intact in all  extremities and symmetric; no facial droop Skin: Warm and dry; crusted, oozing lesion over left hip with surrounding induration but no fluctuance:    Psychiatric: Normal mood and affect   RESULTS  Summary of this visit's results, reviewed by myself:   EKG Interpretation  Date/Time:    Ventricular Rate:    PR Interval:    QRS Duration:   QT Interval:    QTC Calculation:   R Axis:     Text Interpretation:        Laboratory Studies: Results for orders placed or performed during the hospital encounter of 01/06/19 (from the past 24 hour(s))  Pregnancy, urine     Status: None   Collection Time: 01/06/19  1:57 AM  Result Value Ref Range  Preg Test, Ur NEGATIVE NEGATIVE   Imaging Studies: No results found.  ED COURSE and MDM  Nursing notes and initial vitals signs, including pulse oximetry, reviewed.  Vitals:   01/05/19 2303 01/06/19 0158  BP: 137/86 140/84  Pulse: 90 80  Resp: 20 20  Temp: 98.7 F (37.1 C) 97.8 F (36.6 C)  TempSrc: Oral   SpO2: 100%   Weight: 132.9 kg   Height: 5' (1.524 m)     PROCEDURES    ED DIAGNOSES     ICD-10-CM   1. Bug bite with infection, initial encounter  W57.Encarnacion Chu, MD 01/06/19 [phone]

## 2019-01-06 NOTE — ED Notes (Signed)
Pt given bacitracin sample packs to take home with instructions to apply to site 2 times daily. Pt verbalized understanding.

## 2019-01-06 NOTE — ED Notes (Signed)
ED Provider at bedside. 

## 2019-04-07 ENCOUNTER — Encounter (HOSPITAL_BASED_OUTPATIENT_CLINIC_OR_DEPARTMENT_OTHER): Payer: Self-pay | Admitting: *Deleted

## 2019-04-07 ENCOUNTER — Other Ambulatory Visit: Payer: Self-pay

## 2019-04-07 ENCOUNTER — Emergency Department (HOSPITAL_BASED_OUTPATIENT_CLINIC_OR_DEPARTMENT_OTHER)
Admission: EM | Admit: 2019-04-07 | Discharge: 2019-04-07 | Disposition: A | Discharge status: Home / Self Care | Payer: Medicaid Other | Attending: Emergency Medicine | Admitting: Emergency Medicine

## 2019-04-07 DIAGNOSIS — Z886 Allergy status to analgesic agent status: Secondary | ICD-10-CM | POA: Diagnosis not present

## 2019-04-07 DIAGNOSIS — M797 Fibromyalgia: Secondary | ICD-10-CM | POA: Insufficient documentation

## 2019-04-07 DIAGNOSIS — Z20828 Contact with and (suspected) exposure to other viral communicable diseases: Secondary | ICD-10-CM | POA: Diagnosis not present

## 2019-04-07 DIAGNOSIS — Z885 Allergy status to narcotic agent status: Secondary | ICD-10-CM | POA: Diagnosis not present

## 2019-04-07 DIAGNOSIS — Z881 Allergy status to other antibiotic agents status: Secondary | ICD-10-CM | POA: Insufficient documentation

## 2019-04-07 DIAGNOSIS — I10 Essential (primary) hypertension: Secondary | ICD-10-CM | POA: Diagnosis not present

## 2019-04-07 DIAGNOSIS — J111 Influenza due to unidentified influenza virus with other respiratory manifestations: Secondary | ICD-10-CM | POA: Insufficient documentation

## 2019-04-07 DIAGNOSIS — M069 Rheumatoid arthritis, unspecified: Secondary | ICD-10-CM | POA: Insufficient documentation

## 2019-04-07 DIAGNOSIS — R0789 Other chest pain: Secondary | ICD-10-CM | POA: Diagnosis present

## 2019-04-07 DIAGNOSIS — Z87891 Personal history of nicotine dependence: Secondary | ICD-10-CM | POA: Diagnosis not present

## 2019-04-07 DIAGNOSIS — Z79899 Other long term (current) drug therapy: Secondary | ICD-10-CM | POA: Diagnosis not present

## 2019-04-07 MED ORDER — PREDNISONE 20 MG PO TABS: 40.0000 mg | ORAL_TABLET | Freq: Every day | ORAL | 0 refills | Status: DC

## 2019-04-07 MED ORDER — ALBUTEROL SULFATE HFA 108 (90 BASE) MCG/ACT IN AERS: 2.0000 | INHALATION_SPRAY | RESPIRATORY_TRACT | 0 refills | Status: AC | PRN

## 2019-04-07 MED ORDER — BENZONATATE 100 MG PO CAPS: 100.0000 mg | ORAL_CAPSULE | Freq: Three times a day (TID) | ORAL | 0 refills | Status: DC

## 2019-04-07 NOTE — ED Triage Notes (Signed)
Pt reports generalized chest pain and chills today. Endorses cough and sweating. She states she took tylenol and percocet at 330pm today

## 2019-04-07 NOTE — ED Notes (Signed)
Provided RX x3 and note for court. Educated patient to follow up with primary care and monitor her symptoms closely.

## 2019-04-07 NOTE — Discharge Instructions (Signed)
We have tested you for coronavirus.  These results will be back in 1 to 2 days most likely.  You are to quarantine until you are better, you have an illness that could be consistent with coronavirus or the flu or some other virus.  It will be contagious by close contact, coughing, sneezing and runny nose.  Tylenol or ibuprofen for pain or fever, Tessalon for coughing, prednisone 40 mg daily for 5 days and use your albuterol inhaler 2 puffs every 4 hours as needed for shortness of breath or wheezing.  Seek medical exam for increasing chest pain, coughing, shortness of breath or fevers that will not go away with Tylenol.  You should see your doctor in 1 week if no better, emergency department for worsening symptoms or other concerns

## 2019-04-07 NOTE — ED Provider Notes (Addendum)
[facility] EMERGENCY DEPARTMENT Provider Note   CSN: [id number] Arrival date & time: 04/07/19  [id number]     History Chief Complaint  Patient presents with  . Chest Pain    Felicia Molina is a 48 y.o. female.  HPI     This patient is a 48 year old female, she has a known history of asthma as well as hypertension and fibromyalgia, she is obese, thankfully she does not have diabetes.  She presents to the hospital with approximately 1 day of symptoms including a complaint of acute onset of chills, headache, sore throat, runny nose and coughing which started today.  She does not feel short of breath, she is not swollen in her legs.  She was exposed to both her young child, several years old who developed a fever and a runny nose last night as well as her "baby daddy" who has been in and out of town and had a flulike illness recently.  She does not know if anybody has been positive for either the flu or coronavirus.  She denies any urinary symptoms or diarrhea, no nausea or vomiting, no loss of taste or smell sensation.  No medications given prior to arrival.  Past Medical History:  Diagnosis Date  . Asthma   . Atypical chest pain 07/28/2018  . Back pain, chronic   . Fibromyalgia   . Hypertension   . Morbid obesity ([place])   . Neurogenic bladder   . Pain management contract signed   . Palpitations 07/28/2018  . Panic attacks   . Reflux     Patient Active Problem List   Diagnosis Date Noted  . Atypical chest pain 07/28/2018  . Palpitations 07/28/2018  . Right wrist pain 11/08/2017  . Asthma 10/14/2017  . Depression 10/14/2017  . GERD (gastroesophageal reflux disease) 10/14/2017  . Fibroid 09/09/2017  . Morbid obesity with BMI of 50.0-59.9, adult ([place]) 08/05/2016  . Rheumatoid arthritis ([place]) 08/05/2016  . Chronic bilateral low back pain without sciatica 05/13/2015  . Plantar fasciitis, bilateral 07/19/2012  . Metatarsalgia of both feet 07/19/2012  . Acute respiratory  failure ([place]) 05/07/2012  . Hypertension   . CAP (community acquired pneumonia) 05/06/2012    Past Surgical History:  Procedure Laterality Date  . BACK SURGERY    . bladder stimulator    . CARPAL TUNNEL RELEASE    . TONSILLECTOMY    . URETERAL STENT PLACEMENT  2008     OB History    Gravida  6   Para  4   Term  4   Preterm  0   AB  2   Living  4     SAB  2   TAB  0   Ectopic  0   Multiple  0   Live Births  4           Family History  Problem Relation Age of Onset  . Heart attack Mother   . Heart disease Father   . Heart attack Father   . Heart disease Sister     Social History   Tobacco Use  . Smoking status: Former Research scientist (life sciences)  . Smokeless tobacco: Never Used  . Tobacco comment: quit 2004  Substance Use Topics  . Alcohol use: No  . Drug use: No    Home Medications Prior to Admission medications   Medication Sig Start Date End Date Taking? Authorizing Provider  albuterol (VENTOLIN HFA) 108 (90 Base) MCG/ACT inhaler Inhale 2 puffs into the lungs every 4 (  four) hours as needed for wheezing or shortness of breath. 04/07/19   Noemi Chapel, MD  ALPRAZolam Duanne Moron) 0.5 MG tablet Take 0.5 mg by mouth at bedtime as needed. Takes for depression    [provider]  amLODipine (NORVASC) 10 MG tablet Take 25 mg by mouth daily.     [provider]  atenolol (TENORMIN) 25 MG tablet Take 100 mg by mouth daily.     [provider]  atenolol-chlorthalidone (TENORETIC) 100-25 MG tablet Take by mouth. 10/14/17   [provider]  benzonatate (TESSALON) 100 MG capsule Take 1 capsule (100 mg total) by mouth every 8 (eight) hours. 04/07/19   Noemi Chapel, MD  budesonide-formoterol [facility]) 160-4.5 MCG/ACT inhaler Inhale 2 puffs into the lungs 2 (two) times daily.    [provider]  buPROPion (WELLBUTRIN SR) 150 MG 12 hr tablet Take by mouth. 10/14/17   [provider]  cetirizine (ZYRTEC) 10 MG tablet TK 1 T PO  DAILY 10/14/17   [provider]  citalopram (CELEXA) 20 MG tablet Take by mouth. 06/11/16   [provider]  cyclobenzaprine (FLEXERIL) 10 MG tablet Take 10 mg by mouth 3 (three) times daily as needed. Takes for muscle spasms    [provider]  diclofenac sodium (VOLTAREN) 1 % GEL Apply 2 g topically 4 (four) times daily.    [provider]  doxycycline (VIBRAMYCIN) 100 MG capsule Take 1 capsule (100 mg total) by mouth 2 (two) times daily. One po bid x 7 days 01/06/19   Molpus, John, MD  fluticasone [facility]) 50 MCG/ACT nasal spray 2 SPRAYS INTO EACH NOSTRIL DAILY 10/14/17   [provider]  furosemide (LASIX) 40 MG tablet Take 1 tablet (40 mg total) by mouth daily. 05/09/12   Verlee Monte, MD  hydrALAZINE (APRESOLINE) 25 MG tablet Take 100 mg by mouth daily.    [provider]  ipratropium (ATROVENT) 0.06 % nasal spray Place 2 sprays into the nose 4 (four) times daily. 03/04/13   Billy Fischer, MD  losartan (COZAAR) 100 MG tablet TK 1 T PO DAILY 10/14/17   [provider]  montelukast (SINGULAIR) 10 MG tablet Take 10 mg by mouth at bedtime.    [provider]  norethindrone (MICRONOR,CAMILA,ERRIN) 0.35 MG tablet  09/09/17   [provider]  Norgestimate-Ethinyl Estradiol Triphasic (ORTHO TRI-CYCLEN LO) 0.18/0.215/0.25 MG-25 MCG tab Take 1 tablet by mouth daily. Patient not taking: Reported on 07/28/2018 09/03/17   Drenda Freeze, MD  OMEPRAZOLE PO Take 40 mg by mouth daily.     [provider]  oxymorphone (OPANA ER) 20 MG 12 hr tablet Take 20 mg by mouth every 12 (twelve) hours.    [provider]  potassium chloride SA (K-DUR,KLOR-CON) 20 MEQ tablet Take 1 tablet (20 mEq total) by mouth daily. 09/03/17   Drenda Freeze, MD  predniSONE (DELTASONE) 20 MG tablet Take 2 tablets (40 mg total) by mouth daily. 04/07/19   Noemi Chapel, MD  pregabalin (LYRICA) 75 MG capsule Take 75 mg by mouth 3 (three)  times daily.    [provider]  rosuvastatin (CRESTOR) 20 MG tablet TK 1 T PO  Q NIGHT 10/14/17   [provider]  traMADol (ULTRAM) 50 MG tablet Take 1 tablet (50 mg total) by mouth every 6 (six) hours as needed. 09/28/18   Veryl Speak, MD  potassium chloride (K-DUR) 10 MEQ tablet Take 1 tablet (10 mEq total) by mouth daily. 10/11/18 01/06/19  Petrucelli, Samantha R, PA-C    Allergies    Clindamycin/lincomycin, Ibuprofen, Tomato, and Vicodin [hydrocodone-acetaminophen]  Review of Systems   Review of Systems  All other systems reviewed and are negative.   Physical Exam Updated Vital Signs BP 97/61 (BP Location: Left Wrist)   Pulse 72   Temp 98.9 F (37.2 C) (Oral)   Resp 20   Ht 1.524 m (5')   Wt 129.3 kg   LMP 03/05/2019   SpO2 98%   BMI 55.66 kg/m   Physical Exam Vitals and nursing note reviewed.  Constitutional:      General: She is not in acute distress.    Appearance: She is well-developed.  HENT:     Head: Normocephalic and atraumatic.     Mouth/Throat:     Pharynx: No oropharyngeal exudate.  Eyes:     General: No scleral icterus.       Right eye: No discharge.        Left eye: No discharge.     Conjunctiva/sclera: Conjunctivae normal.     Pupils: Pupils are equal, round, and reactive to light.  Neck:     Thyroid: No thyromegaly.     Vascular: No JVD.  Cardiovascular:     Rate and Rhythm: Normal rate and regular rhythm.     Heart sounds: Normal heart sounds. No murmur. No friction rub. No gallop.   Pulmonary:     Effort: Pulmonary effort is normal. No respiratory distress.     Breath sounds: Normal breath sounds. No wheezing or rales.  Abdominal:     General: Bowel sounds are normal. There is no distension.     Palpations: Abdomen is soft. There is no mass.     Tenderness: There is no abdominal tenderness.  Musculoskeletal:        General: No tenderness. Normal range of motion.     Cervical back: Normal range of motion and neck supple.    Lymphadenopathy:     Cervical: No cervical adenopathy.  Skin:    General: Skin is warm and dry.     Findings: No erythema or rash.  Neurological:     Mental Status: She is alert.     Coordination: Coordination normal.  Psychiatric:        Behavior: Behavior normal.     ED Results / Procedures / Treatments   Labs (all labs ordered are listed, but only abnormal results are displayed) Labs Reviewed  SARS CORONAVIRUS 2 (TAT 6-24 HRS)    EKG None  Radiology No results found.  Procedures Procedures (including critical care time)  Medications Ordered in ED Medications - No data to display  ED Course  I have reviewed the triage vital signs and the nursing notes.  Pertinent labs & imaging results that were available during my care of the patient were reviewed by me and considered in my medical decision making (see chart for details).    MDM Rules/Calculators/A&P                       This patient has what appears to be a coronavirus or flulike illness.  She is very well-appearing, she has good pulses, her blood pressure of 97/61 is nonconcerning with no fever, no tachycardia, no nausea or vomiting and no shortness of breath.  I suspect she has classic symptoms of either flu or coronavirus, she is agreeable to return should her symptoms worsen however she will be given Tessalon prednisone and albuterol for use  at home.  She is agreeable to quarantine as well and is understanding of the instructions for quarantine.  She has multiple sick contacts including a sick child as well as a sick significant other with similar symptoms in the sounds very viral.  Felicia Molina was evaluated in Emergency Department on 04/07/2019 for the symptoms described in the history of present illness. She was evaluated in the context of the global COVID-19 pandemic, which necessitated consideration that the patient might be at risk for infection with the SARS-CoV-2 virus that causes COVID-19.  Institutional protocols and algorithms that pertain to the evaluation of patients at risk for COVID-19 are in a state of rapid change based on information released by regulatory bodies including the CDC and federal and state organizations. These policies and algorithms were followed during the patient's care in the ED.   Covid testing done prior to discharge, results will not be in hand for 1 or 2 days.  Final Clinical Impression(s) / ED Diagnoses Final diagnoses:  Influenza-like illness    Rx / DC Orders ED Discharge Orders         Ordered    predniSONE (DELTASONE) 20 MG tablet  Daily     04/07/19 2003    albuterol (VENTOLIN HFA) 108 (90 Base) MCG/ACT inhaler  Every 4 hours PRN     04/07/19 2003    benzonatate (TESSALON) 100 MG capsule  Every 8 hours     04/07/19 2003           Noemi Chapel, MD 04/07/19 Jarvis Newcomer    Noemi Chapel, MD 04/07/19 2015

## 2019-04-08 LAB — SARS CORONAVIRUS 2 (TAT 6-24 HRS): SARS Coronavirus 2: NEGATIVE

## 2019-04-15 ENCOUNTER — Encounter (HOSPITAL_BASED_OUTPATIENT_CLINIC_OR_DEPARTMENT_OTHER): Payer: Self-pay | Admitting: Emergency Medicine

## 2019-04-15 ENCOUNTER — Emergency Department (HOSPITAL_BASED_OUTPATIENT_CLINIC_OR_DEPARTMENT_OTHER): Payer: Medicaid Other

## 2019-04-15 ENCOUNTER — Other Ambulatory Visit: Payer: Self-pay

## 2019-04-15 ENCOUNTER — Emergency Department (HOSPITAL_BASED_OUTPATIENT_CLINIC_OR_DEPARTMENT_OTHER)
Admission: EM | Admit: 2019-04-15 | Discharge: 2019-04-15 | Disposition: A | Discharge status: Home / Self Care | Payer: Medicaid Other | Attending: Emergency Medicine | Admitting: Emergency Medicine

## 2019-04-15 DIAGNOSIS — I10 Essential (primary) hypertension: Secondary | ICD-10-CM | POA: Diagnosis not present

## 2019-04-15 DIAGNOSIS — Z79899 Other long term (current) drug therapy: Secondary | ICD-10-CM | POA: Diagnosis not present

## 2019-04-15 DIAGNOSIS — M069 Rheumatoid arthritis, unspecified: Secondary | ICD-10-CM | POA: Insufficient documentation

## 2019-04-15 DIAGNOSIS — Z886 Allergy status to analgesic agent status: Secondary | ICD-10-CM | POA: Insufficient documentation

## 2019-04-15 DIAGNOSIS — M797 Fibromyalgia: Secondary | ICD-10-CM | POA: Diagnosis not present

## 2019-04-15 DIAGNOSIS — R0789 Other chest pain: Secondary | ICD-10-CM | POA: Diagnosis not present

## 2019-04-15 DIAGNOSIS — R079 Chest pain, unspecified: Secondary | ICD-10-CM

## 2019-04-15 DIAGNOSIS — Z885 Allergy status to narcotic agent status: Secondary | ICD-10-CM | POA: Insufficient documentation

## 2019-04-15 DIAGNOSIS — J45909 Unspecified asthma, uncomplicated: Secondary | ICD-10-CM | POA: Diagnosis not present

## 2019-04-15 DIAGNOSIS — Z881 Allergy status to other antibiotic agents status: Secondary | ICD-10-CM | POA: Insufficient documentation

## 2019-04-15 LAB — TROPONIN I (HIGH SENSITIVITY): Troponin I (High Sensitivity): <2 ng/L (ref <18)

## 2019-04-15 LAB — COMPREHENSIVE METABOLIC PANEL
ALT: 26 U/L (ref 0–44)
AST: 16 U/L (ref 15–41)
Albumin: 3.7 g/dL (ref 3.5–5.0)
Alkaline Phosphatase: 44 U/L (ref 38–126)
Anion gap: 12 (ref 5–15)
BUN: 16 mg/dL (ref 6–20)
CO2: 27 mmol/L (ref 22–32)
Calcium: 9 mg/dL (ref 8.9–10.3)
Chloride: 100 mmol/L (ref 98–111)
Creatinine, Ser: 1.06 mg/dL — ABNORMAL HIGH (ref 0.44–1.00)
GFR calc Af Amer: >60 mL/min (ref >60)
GFR calc non Af Amer: >60 mL/min (ref >60)
Glucose, Bld: 118 mg/dL — ABNORMAL HIGH (ref 70–99)
Potassium: 3.3 mmol/L — ABNORMAL LOW (ref 3.5–5.1)
Sodium: 139 mmol/L (ref 135–145)
Total Bilirubin: 0.5 mg/dL (ref 0.3–1.2)
Total Protein: 7.1 g/dL (ref 6.5–8.1)

## 2019-04-15 LAB — CBC WITH DIFFERENTIAL/PLATELET
Abs Immature Granulocytes: 0.13 10*3/uL — ABNORMAL HIGH (ref 0.00–0.07)
Basophils Absolute: 0 10*3/uL (ref 0.0–0.1)
Basophils Relative: 0 %
Eosinophils Absolute: 0 10*3/uL (ref 0.0–0.5)
Eosinophils Relative: 0 %
HCT: 35.6 % — ABNORMAL LOW (ref 36.0–46.0)
Hemoglobin: 11.1 g/dL — ABNORMAL LOW (ref 12.0–15.0)
Immature Granulocytes: 1 %
Lymphocytes Relative: 16 %
Lymphs Abs: 2.2 10*3/uL (ref 0.7–4.0)
MCH: 28.6 pg (ref 26.0–34.0)
MCHC: 31.2 g/dL (ref 30.0–36.0)
MCV: 91.8 fL (ref 80.0–100.0)
Monocytes Absolute: 0.6 10*3/uL (ref 0.1–1.0)
Monocytes Relative: 5 %
Neutro Abs: 10.6 10*3/uL — ABNORMAL HIGH (ref 1.7–7.7)
Neutrophils Relative %: 78 %
Platelets: 352 10*3/uL (ref 150–400)
RBC: 3.88 MIL/uL (ref 3.87–5.11)
RDW: 14.4 % (ref 11.5–15.5)
WBC: 13.6 10*3/uL — ABNORMAL HIGH (ref 4.0–10.5)
nRBC: 0 % (ref 0.0–0.2)

## 2019-04-15 MED ORDER — ONDANSETRON HCL 4 MG/2ML IJ SOLN
4.0000 mg | Freq: Once | INTRAMUSCULAR | Status: AC
  Administered 2019-04-15: 4 mg via INTRAVENOUS
  Filled 2019-04-15: qty 2

## 2019-04-15 MED ORDER — MORPHINE SULFATE (PF) 4 MG/ML IV SOLN
4.0000 mg | Freq: Once | INTRAVENOUS | Status: AC
  Administered 2019-04-15: 4 mg via INTRAVENOUS
  Filled 2019-04-15: qty 1

## 2019-04-15 NOTE — ED Provider Notes (Signed)
[facility] EMERGENCY DEPARTMENT Provider Note   CSN: [id number] Arrival date & time: 04/15/19  0800     History Chief Complaint  Patient presents with  . Chest Pain    Felicia Molina is a 48 y.o. female.  Patient is a 48 year old female with past medical history of chronic back pain, fibromyalgia, hypertension, obesity.  She presents today for evaluation of chest pain.  She describes a pressure in her chest that woke her up at approximately 3 AM.  This is located in the left upper anterior chest wall and worse when she breathes or moves.  She denies shortness of breath, diaphoresis, nausea, or radiation of the pain to her arm or jaw.  She denies any fevers or chills.  She reports taking one of her pain pills at home prior to coming here.  Patient denies prior cardiac history.  The history is provided by the patient.  Chest Pain Pain location:  L chest Pain quality: pressure   Pain radiates to:  Does not radiate Pain severity:  Moderate Onset quality:  Sudden Duration:  5 hours Timing:  Constant Progression:  Worsening Chronicity:  New Relieved by:  Nothing Worsened by:  Nothing      Past Medical History:  Diagnosis Date  . Asthma   . Atypical chest pain 07/28/2018  . Back pain, chronic   . Fibromyalgia   . Hypertension   . Morbid obesity ([place])   . Neurogenic bladder   . Pain management contract signed   . Palpitations 07/28/2018  . Panic attacks   . Reflux     Patient Active Problem List   Diagnosis Date Noted  . Atypical chest pain 07/28/2018  . Palpitations 07/28/2018  . Right wrist pain 11/08/2017  . Asthma 10/14/2017  . Depression 10/14/2017  . GERD (gastroesophageal reflux disease) 10/14/2017  . Fibroid 09/09/2017  . Morbid obesity with BMI of 50.0-59.9, adult ([place]) 08/05/2016  . Rheumatoid arthritis ([place]) 08/05/2016  . Chronic bilateral low back pain without sciatica 05/13/2015  . Plantar fasciitis, bilateral 07/19/2012  . Metatarsalgia of  both feet 07/19/2012  . Acute respiratory failure ([place]) 05/07/2012  . Hypertension   . CAP (community acquired pneumonia) 05/06/2012    Past Surgical History:  Procedure Laterality Date  . BACK SURGERY    . bladder stimulator    . CARPAL TUNNEL RELEASE    . TONSILLECTOMY    . URETERAL STENT PLACEMENT  2008     OB History    Gravida  6   Para  4   Term  4   Preterm  0   AB  2   Living  4     SAB  2   TAB  0   Ectopic  0   Multiple  0   Live Births  4           Family History  Problem Relation Age of Onset  . Heart attack Mother   . Heart disease Father   . Heart attack Father   . Heart disease Sister     Social History   Tobacco Use  . Smoking status: Former Research scientist (life sciences)  . Smokeless tobacco: Never Used  . Tobacco comment: quit 2004  Substance Use Topics  . Alcohol use: No  . Drug use: No    Home Medications Prior to Admission medications   Medication Sig Start Date End Date Taking? Authorizing Provider  albuterol (VENTOLIN HFA) 108 (90 Base) MCG/ACT inhaler Inhale 2 puffs into  the lungs every 4 (four) hours as needed for wheezing or shortness of breath. 04/07/19   Noemi Chapel, MD  ALPRAZolam Duanne Moron) 0.5 MG tablet Take 0.5 mg by mouth at bedtime as needed. Takes for depression    [provider]  amLODipine (NORVASC) 10 MG tablet Take 25 mg by mouth daily.     [provider]  atenolol (TENORMIN) 25 MG tablet Take 100 mg by mouth daily.     [provider]  atenolol-chlorthalidone (TENORETIC) 100-25 MG tablet Take by mouth. 10/14/17   [provider]  benzonatate (TESSALON) 100 MG capsule Take 1 capsule (100 mg total) by mouth every 8 (eight) hours. 04/07/19   Noemi Chapel, MD  budesonide-formoterol [facility]) 160-4.5 MCG/ACT inhaler Inhale 2 puffs into the lungs 2 (two) times daily.    [provider]  buPROPion (WELLBUTRIN SR) 150 MG 12 hr tablet Take by mouth. 10/14/17   [provider]    cetirizine (ZYRTEC) 10 MG tablet TK 1 T PO DAILY 10/14/17   [provider]  citalopram (CELEXA) 20 MG tablet Take by mouth. 06/11/16   [provider]  cyclobenzaprine (FLEXERIL) 10 MG tablet Take 10 mg by mouth 3 (three) times daily as needed. Takes for muscle spasms    [provider]  diclofenac sodium (VOLTAREN) 1 % GEL Apply 2 g topically 4 (four) times daily.    [provider]  doxycycline (VIBRAMYCIN) 100 MG capsule Take 1 capsule (100 mg total) by mouth 2 (two) times daily. One po bid x 7 days 01/06/19   Molpus, John, MD  fluticasone [facility]) 50 MCG/ACT nasal spray 2 SPRAYS INTO EACH NOSTRIL DAILY 10/14/17   [provider]  furosemide (LASIX) 40 MG tablet Take 1 tablet (40 mg total) by mouth daily. 05/09/12   Verlee Monte, MD  hydrALAZINE (APRESOLINE) 25 MG tablet Take 100 mg by mouth daily.    [provider]  ipratropium (ATROVENT) 0.06 % nasal spray Place 2 sprays into the nose 4 (four) times daily. 03/04/13   Billy Fischer, MD  losartan (COZAAR) 100 MG tablet TK 1 T PO DAILY 10/14/17   [provider]  montelukast (SINGULAIR) 10 MG tablet Take 10 mg by mouth at bedtime.    [provider]  norethindrone (MICRONOR,CAMILA,ERRIN) 0.35 MG tablet  09/09/17   [provider]  Norgestimate-Ethinyl Estradiol Triphasic (ORTHO TRI-CYCLEN LO) 0.18/0.215/0.25 MG-25 MCG tab Take 1 tablet by mouth daily. Patient not taking: Reported on 07/28/2018 09/03/17   Drenda Freeze, MD  OMEPRAZOLE PO Take 40 mg by mouth daily.     [provider]  oxymorphone (OPANA ER) 20 MG 12 hr tablet Take 20 mg by mouth every 12 (twelve) hours.    [provider]  potassium chloride SA (K-DUR,KLOR-CON) 20 MEQ tablet Take 1 tablet (20 mEq total) by mouth daily. 09/03/17   Drenda Freeze, MD  predniSONE (DELTASONE) 20 MG tablet Take 2 tablets (40 mg total) by mouth daily. 04/07/19   Noemi Chapel, MD  pregabalin (LYRICA)  75 MG capsule Take 75 mg by mouth 3 (three) times daily.    [provider]  rosuvastatin (CRESTOR) 20 MG tablet TK 1 T PO  Q NIGHT 10/14/17   [provider]  traMADol (ULTRAM) 50 MG tablet Take 1 tablet (50 mg total) by mouth every 6 (six) hours as needed. 09/28/18   Veryl Speak, MD  potassium chloride (K-DUR) 10 MEQ tablet Take 1 tablet (10 mEq total) by  mouth daily. 10/11/18 01/06/19  Petrucelli, Samantha R, PA-C    Allergies    Clindamycin/lincomycin, Ibuprofen, Tomato, and Vicodin [hydrocodone-acetaminophen]  Review of Systems   Review of Systems  Cardiovascular: Positive for chest pain.  All other systems reviewed and are negative.   Physical Exam Updated Vital Signs BP 117/70 (BP Location: Right Arm)   Pulse 90   Temp 98.8 F (37.1 C) (Oral)   Resp (!) 28   Ht 5' (1.524 m)   Wt 129.7 kg   LMP 04/04/2019   SpO2 98%   BMI 55.86 kg/m   Physical Exam Vitals and nursing note reviewed.  Constitutional:      General: She is not in acute distress.    Appearance: She is well-developed. She is not diaphoretic.  HENT:     Head: Normocephalic and atraumatic.  Cardiovascular:     Rate and Rhythm: Normal rate and regular rhythm.     Heart sounds: No murmur. No friction rub. No gallop.   Pulmonary:     Effort: Pulmonary effort is normal. No respiratory distress.     Breath sounds: Normal breath sounds. No wheezing.     Comments: There is tenderness to palpation of the left upper anterior chest wall which appears to reproduce her symptoms. Abdominal:     General: Bowel sounds are normal. There is no distension.     Palpations: Abdomen is soft.     Tenderness: There is no abdominal tenderness.  Musculoskeletal:        General: Normal range of motion.     Cervical back: Normal range of motion and neck supple.     Right lower leg: No tenderness. No edema.     Left lower leg: No tenderness. No edema.  Skin:    General: Skin is warm and dry.  Neurological:      Mental Status: She is alert and oriented to person, place, and time.     ED Results / Procedures / Treatments   Labs (all labs ordered are listed, but only abnormal results are displayed) Labs Reviewed  COMPREHENSIVE METABOLIC PANEL  CBC WITH DIFFERENTIAL/PLATELET  TROPONIN I (HIGH SENSITIVITY)    EKG EKG Interpretation  Date/Time:  Sunday April 15 2019 08:12:20 EST Ventricular Rate:  87 PR Interval:    QRS Duration: 88 QT Interval:  393 QTC Calculation: 473 R Axis:   33 Text Interpretation: Sinus rhythm Low voltage, precordial leads Consider anterior infarct Confirmed by Veryl Speak [phone]) on 04/15/2019 8:14:56 AM   Radiology No results found.  Procedures Procedures (including critical care time)  Medications Ordered in ED Medications  morphine 4 MG/ML injection 4 mg (has no administration in time range)  ondansetron (ZOFRAN) injection 4 mg (has no administration in time range)    ED Course  I have reviewed the triage vital signs and the nursing notes.  Pertinent labs & imaging results that were available during my care of the patient were reviewed by me and considered in my medical decision making (see chart for details).    MDM Rules/Calculators/A&P  Patient presenting here with complaints of chest discomfort that seems noncardiac in nature.  Her EKG is unchanged from prior studies and troponin is negative, 5 hours after the onset of symptoms.  Her chest x-ray is clear and remainder of laboratory studies are unremarkable.  Her symptoms are reproducible with palpation of the chest wall and I highly doubt a cardiac etiology.  At this point, patient is requesting to go home.  She  states that her daughter is here to pick her up and she is ready to leave.  She will be discharged at her request, to return as needed.  Final Clinical Impression(s) / ED Diagnoses Final diagnoses:  None    Rx / DC Orders ED Discharge Orders    None       Veryl Speak,  MD 04/15/19 [phone]

## 2019-04-15 NOTE — Discharge Instructions (Addendum)
Take your home medications as needed for pain.  Return to the emergency department for worsening pain, difficulty breathing, high fever, or other new and concerning symptoms.

## 2019-04-15 NOTE — ED Notes (Signed)
ED Provider at bedside. 

## 2019-04-15 NOTE — ED Triage Notes (Signed)
Woke up this morning with her "chest pounding".

## 2019-05-18 ENCOUNTER — Ambulatory Visit: Payer: Medicaid Other | Admitting: Cardiology

## 2019-05-18 NOTE — Progress Notes (Deleted)
Follow up visit  Subjective:   Felicia Molina, female    DOB: Apr 13, 1971, 49 y.o.   MRN: [id number]   No chief complaint on file.    HPI  49 y.o. African American female with hypertension, prediabetes, morbid obesity, mild hyperlipidemia, here for acute visit for chest pain.   Pain has been ongoing since yesterday. It is left sided, hurts on pressing on her chest wall. She reports lifting her 45 lb child. She denies any exertional component to her chest pain. BP is well controlled.   Past Medical History:  Diagnosis Date  . Asthma   . Atypical chest pain 07/28/2018  . Back pain, chronic   . Fibromyalgia   . Hypertension   . Morbid obesity ([place])   . Neurogenic bladder   . Pain management contract signed   . Palpitations 07/28/2018  . Panic attacks   . Reflux      Past Surgical History:  Procedure Laterality Date  . BACK SURGERY    . bladder stimulator    . CARPAL TUNNEL RELEASE    . TONSILLECTOMY    . URETERAL STENT PLACEMENT  2008     Social History   Socioeconomic History  . Marital status: Single    Spouse name: Not on file  . Number of children: 4  . Years of education: Not on file  . Highest education level: Not on file  Occupational History  . Not on file  Tobacco Use  . Smoking status: Former Research scientist (life sciences)  . Smokeless tobacco: Never Used  . Tobacco comment: quit 2004  Substance and Sexual Activity  . Alcohol use: No  . Drug use: No  . Sexual activity: Not on file  Other Topics Concern  . Not on file  Social History Narrative  . Not on file   Social Determinants of Health   Financial Resource Strain:   . Difficulty of Paying Living Expenses: Not on file  Food Insecurity:   . Worried About Charity fundraiser in the Last Year: Not on file  . Ran Out of Food in the Last Year: Not on file  Transportation Needs:   . Lack of Transportation (Medical): Not on file  . Lack of Transportation (Non-Medical): Not on file  Physical Activity:   . Days of  Exercise per Week: Not on file  . Minutes of Exercise per Session: Not on file  Stress:   . Feeling of Stress : Not on file  Social Connections:   . Frequency of Communication with Friends and Family: Not on file  . Frequency of Social Gatherings with Friends and Family: Not on file  . Attends Religious Services: Not on file  . Active Member of Clubs or Organizations: Not on file  . Attends Archivist Meetings: Not on file  . Marital Status: Not on file  Intimate Partner Violence:   . Fear of Current or Ex-Partner: Not on file  . Emotionally Abused: Not on file  . Physically Abused: Not on file  . Sexually Abused: Not on file     Current Outpatient Medications on File Prior to Visit  Medication Sig Dispense Refill  . albuterol (VENTOLIN HFA) 108 (90 Base) MCG/ACT inhaler Inhale 2 puffs into the lungs every 4 (four) hours as needed for wheezing or shortness of breath. 6.7 g 0  . ALPRAZolam (XANAX) 0.5 MG tablet Take 0.5 mg by mouth at bedtime as needed. Takes for depression    . amLODipine (NORVASC) 10 MG  tablet Take 25 mg by mouth daily.     Marland Kitchen atenolol (TENORMIN) 25 MG tablet Take 100 mg by mouth daily.     Marland Kitchen atenolol-chlorthalidone (TENORETIC) 100-25 MG tablet Take by mouth.    . benzonatate (TESSALON) 100 MG capsule Take 1 capsule (100 mg total) by mouth every 8 (eight) hours. 21 capsule 0  . budesonide-formoterol (SYMBICORT) 160-4.5 MCG/ACT inhaler Inhale 2 puffs into the lungs 2 (two) times daily.    Marland Kitchen buPROPion (WELLBUTRIN SR) 150 MG 12 hr tablet Take by mouth.    . cetirizine (ZYRTEC) 10 MG tablet TK 1 T PO DAILY  3  . citalopram (CELEXA) 20 MG tablet Take by mouth.    . cyclobenzaprine (FLEXERIL) 10 MG tablet Take 10 mg by mouth 3 (three) times daily as needed. Takes for muscle spasms    . diclofenac sodium (VOLTAREN) 1 % GEL Apply 2 g topically 4 (four) times daily.    Marland Kitchen doxycycline (VIBRAMYCIN) 100 MG capsule Take 1 capsule (100 mg total) by mouth 2 (two) times  daily. One po bid x 7 days 14 capsule 0  . fluticasone (FLONASE) 50 MCG/ACT nasal spray 2 SPRAYS INTO EACH NOSTRIL DAILY  5  . furosemide (LASIX) 40 MG tablet Take 1 tablet (40 mg total) by mouth daily. 30 tablet 0  . hydrALAZINE (APRESOLINE) 25 MG tablet Take 100 mg by mouth daily.    Marland Kitchen ipratropium (ATROVENT) 0.06 % nasal spray Place 2 sprays into the nose 4 (four) times daily. 15 mL 1  . losartan (COZAAR) 100 MG tablet TK 1 T PO DAILY  3  . montelukast (SINGULAIR) 10 MG tablet Take 10 mg by mouth at bedtime.    . norethindrone (MICRONOR,CAMILA,ERRIN) 0.35 MG tablet   11  . Norgestimate-Ethinyl Estradiol Triphasic (ORTHO TRI-CYCLEN LO) 0.18/0.215/0.25 MG-25 MCG tab Take 1 tablet by mouth daily. (Patient not taking: Reported on 07/28/2018) 1 Package 0  . OMEPRAZOLE PO Take 40 mg by mouth daily.     Marland Kitchen oxymorphone (OPANA ER) 20 MG 12 hr tablet Take 20 mg by mouth every 12 (twelve) hours.    . potassium chloride SA (K-DUR,KLOR-CON) 20 MEQ tablet Take 1 tablet (20 mEq total) by mouth daily. 10 tablet 0  . predniSONE (DELTASONE) 20 MG tablet Take 2 tablets (40 mg total) by mouth daily. 10 tablet 0  . pregabalin (LYRICA) 75 MG capsule Take 75 mg by mouth 3 (three) times daily.    . rosuvastatin (CRESTOR) 20 MG tablet TK 1 T PO  Q NIGHT  3  . traMADol (ULTRAM) 50 MG tablet Take 1 tablet (50 mg total) by mouth every 6 (six) hours as needed. 15 tablet 0  . [DISCONTINUED] potassium chloride (K-DUR) 10 MEQ tablet Take 1 tablet (10 mEq total) by mouth daily. 3 tablet 0   No current facility-administered medications on file prior to visit.    Cardiovascular studies:  EKG 07/28/2018: Sinus rhythm 71 bpm. Normal EKG.  Echocardiogram 06/21/2017: Left ventricle cavity is normal in size. Mild concentric hypertrophy of the left ventricle. Normal global wall motion. Doppler evidence of grade I (impaired) diastolic dysfunction, normal LAP. Calculated EF 63%. Mild to moderate mitral regurgitation. Inadequate  tricuspid regurgitation jet to estimate pulmonary artery pressure. Normal right atrial pressure. No significant change from prior study dated 08/14/2014.  Lexiscan myoview stress test 12/24/2016: 1. This was a 2-day protocol.  The resting electrocardiogram demonstrated normal sinus rhythm, normal resting conduction and no resting arrhythmias. Low voltage.   Stress  EKG is non-diagnostic for ischemia as it a pharmacologic stress using Lexiscan. Stress symptoms included dyspnea, nausea and dizziness.  2. SPECT images demonstrate small perfusion abnormality of mild intensity in the apical myocardial wall(s) on the stress images.  These defects are related to apical thinning.  Gated SPECT images reveal normal myocardial thickening and wall motion.  The left ventricular ejection fraction was calculated or visually estimated to be 53%.  This is a low risk study.  Recent labs: Results for ALFONSO, SKY (MRN [id number]) as of 07/28/2018 15:36  Ref. Range 01/28/2018 [id number]  BASIC METABOLIC PANEL Unknown Rpt (A)  Sodium Latest Ref Range: 135 - 145 mmol/L 134 (L)  Potassium Latest Ref Range: 3.5 - 5.1 mmol/L 3.2 (L)  Chloride Latest Ref Range: 98 - 111 mmol/L 98  CO2 Latest Ref Range: 22 - 32 mmol/L 26  Glucose Latest Ref Range: 70 - 99 mg/dL 97  BUN Latest Ref Range: 6 - 20 mg/dL 19  Creatinine Latest Ref Range: 0.44 - 1.00 mg/dL 1.15 (H)  Calcium Latest Ref Range: 8.9 - 10.3 mg/dL 9.0  Anion gap Latest Ref Range: 5 - 15  10  GFR, Est Non African American Latest Ref Range: >60 mL/min 56 (L)  GFR, Est African American Latest Ref Range: >60 mL/min >60  Troponin I Latest Ref Range: <0.03 ng/mL <0.03   Results for FALAK, FRITSCH (MRN [id number]) as of 07/28/2018 15:36  Ref. Range 01/28/2018 23:30  WBC Latest Ref Range: 4.0 - 10.5 K/uL 11.4 (H)  RBC Latest Ref Range: 3.87 - 5.11 MIL/uL 3.92  Hemoglobin Latest Ref Range: 12.0 - 15.0 g/dL 11.4 (L)  HCT Latest Ref Range: 36.0 - 46.0 % 34.5 (L)  MCV Latest Ref  Range: 78.0 - 100.0 fL 88.0  MCH Latest Ref Range: 26.0 - 34.0 pg 29.1  MCHC Latest Ref Range: 30.0 - 36.0 g/dL 33.0  RDW Latest Ref Range: 11.5 - 15.5 % 13.3  Platelets Latest Ref Range: 150 - 400 K/uL 358     Review of Systems  Constitution: Negative for decreased appetite, malaise/fatigue, weight gain and weight loss.  HENT: Negative for congestion.   Eyes: Negative for visual disturbance.  Cardiovascular: Positive for chest pain. Negative for dyspnea on exertion, leg swelling, palpitations and syncope.  Respiratory: Negative for shortness of breath.   Endocrine: Negative for cold intolerance.  Hematologic/Lymphatic: Does not bruise/bleed easily.  Skin: Negative for itching and rash.  Musculoskeletal: Negative for myalgias.  Gastrointestinal: Negative for abdominal pain, nausea and vomiting.  Genitourinary: Negative for dysuria.  Neurological: Negative for dizziness and weakness.  Psychiatric/Behavioral: The patient is not nervous/anxious.   All other systems reviewed and are negative.        There were no vitals filed for this visit.  Objective:   Physical Exam  Constitutional: She is oriented to person, place, and time. She appears well-developed and well-nourished. No distress.  Morbidly obese  HENT:  Head: Normocephalic and atraumatic.  Eyes: Pupils are equal, round, and reactive to light. Conjunctivae are normal.  Neck: No JVD present.  Cardiovascular: Normal rate, regular rhythm and intact distal pulses.  Pulmonary/Chest: Effort normal and breath sounds normal. She has no wheezes. She has no rales.  Reproducible chest wall tenderness  Abdominal: Soft. Bowel sounds are normal. There is no rebound.  Musculoskeletal:        General: No edema.  Lymphadenopathy:    She has no cervical adenopathy.  Neurological: She is alert and oriented to person, place, and time.  No cranial nerve deficit.  Skin: Skin is warm and dry.  Psychiatric: She has a normal mood and affect.   Nursing note and vitals reviewed.         Assessment & Recommendations:   49 y.o. African American female with hypertension, prediabetes, morbid obesity, mild hyperlipidemia, here for acute visit for chest pain.   1. Musculoskeletal chest pain Normal EKG. Pain is clearly reproducible to palpation. I do not think she needs any further cardiac workup at this time.   I have discussed with patient regarding the safety during COVID Pandemic and steps and precautions to be taken including social distancing, frequent hand wash and use of detergent soap, gels with the patient. I asked the patient to avoid touching mouth, nose, eyes, ears with her hands. I encouraged regular walking around the neighborhood and exercise and regular diet, as long as social distancing can be maintained.   Nigel Mormon, MD [facility] Pager: [phone] Office: [phone] If no answer Cell [phone]

## 2019-05-24 ENCOUNTER — Ambulatory Visit: Payer: Medicaid Other | Admitting: Cardiology

## 2019-07-08 ENCOUNTER — Encounter (HOSPITAL_BASED_OUTPATIENT_CLINIC_OR_DEPARTMENT_OTHER): Payer: Self-pay | Admitting: *Deleted

## 2019-07-08 ENCOUNTER — Other Ambulatory Visit: Payer: Self-pay

## 2019-07-08 ENCOUNTER — Emergency Department (HOSPITAL_BASED_OUTPATIENT_CLINIC_OR_DEPARTMENT_OTHER): Payer: Medicaid Other

## 2019-07-08 ENCOUNTER — Emergency Department (HOSPITAL_BASED_OUTPATIENT_CLINIC_OR_DEPARTMENT_OTHER)
Admission: EM | Admit: 2019-07-08 | Discharge: 2019-07-09 | Disposition: A | Discharge status: Home / Self Care | Payer: Medicaid Other | Attending: Emergency Medicine | Admitting: Emergency Medicine

## 2019-07-08 DIAGNOSIS — R05 Cough: Secondary | ICD-10-CM | POA: Diagnosis not present

## 2019-07-08 DIAGNOSIS — Z7982 Long term (current) use of aspirin: Secondary | ICD-10-CM | POA: Insufficient documentation

## 2019-07-08 DIAGNOSIS — Z79899 Other long term (current) drug therapy: Secondary | ICD-10-CM | POA: Diagnosis not present

## 2019-07-08 DIAGNOSIS — Z885 Allergy status to narcotic agent status: Secondary | ICD-10-CM | POA: Insufficient documentation

## 2019-07-08 DIAGNOSIS — Z886 Allergy status to analgesic agent status: Secondary | ICD-10-CM | POA: Insufficient documentation

## 2019-07-08 DIAGNOSIS — I10 Essential (primary) hypertension: Secondary | ICD-10-CM | POA: Diagnosis not present

## 2019-07-08 DIAGNOSIS — R0789 Other chest pain: Secondary | ICD-10-CM | POA: Diagnosis present

## 2019-07-08 DIAGNOSIS — R600 Localized edema: Secondary | ICD-10-CM | POA: Insufficient documentation

## 2019-07-08 DIAGNOSIS — R059 Cough, unspecified: Secondary | ICD-10-CM

## 2019-07-08 DIAGNOSIS — M069 Rheumatoid arthritis, unspecified: Secondary | ICD-10-CM | POA: Diagnosis not present

## 2019-07-08 DIAGNOSIS — M797 Fibromyalgia: Secondary | ICD-10-CM | POA: Insufficient documentation

## 2019-07-08 DIAGNOSIS — Z87891 Personal history of nicotine dependence: Secondary | ICD-10-CM | POA: Insufficient documentation

## 2019-07-08 DIAGNOSIS — J45909 Unspecified asthma, uncomplicated: Secondary | ICD-10-CM | POA: Diagnosis not present

## 2019-07-08 DIAGNOSIS — Z881 Allergy status to other antibiotic agents status: Secondary | ICD-10-CM | POA: Diagnosis not present

## 2019-07-08 DIAGNOSIS — Z20822 Contact with and (suspected) exposure to covid-19: Secondary | ICD-10-CM | POA: Diagnosis not present

## 2019-07-08 MED ORDER — SODIUM CHLORIDE 0.9% FLUSH
3.0000 mL | Freq: Once | INTRAVENOUS | Status: DC
  Filled 2019-07-08: qty 3

## 2019-07-08 NOTE — ED Triage Notes (Signed)
Pt reports chest pain, leg swelling, SOB, sweating, x 2 days. EKG done and given to Dr. Dina Rich

## 2019-07-09 LAB — BASIC METABOLIC PANEL
Anion gap: 11 (ref 5–15)
BUN: 12 mg/dL (ref 6–20)
CO2: 25 mmol/L (ref 22–32)
Calcium: 9.3 mg/dL (ref 8.9–10.3)
Chloride: 102 mmol/L (ref 98–111)
Creatinine, Ser: 1.34 mg/dL — ABNORMAL HIGH (ref 0.44–1.00)
GFR calc Af Amer: 54 mL/min — ABNORMAL LOW (ref >60)
GFR calc non Af Amer: 46 mL/min — ABNORMAL LOW (ref >60)
Glucose, Bld: 107 mg/dL — ABNORMAL HIGH (ref 70–99)
Potassium: 3.3 mmol/L — ABNORMAL LOW (ref 3.5–5.1)
Sodium: 138 mmol/L (ref 135–145)

## 2019-07-09 LAB — CBC
HCT: 35.5 % — ABNORMAL LOW (ref 36.0–46.0)
Hemoglobin: 11.5 g/dL — ABNORMAL LOW (ref 12.0–15.0)
MCH: 29.5 pg (ref 26.0–34.0)
MCHC: 32.4 g/dL (ref 30.0–36.0)
MCV: 91 fL (ref 80.0–100.0)
Platelets: 381 10*3/uL (ref 150–400)
RBC: 3.9 MIL/uL (ref 3.87–5.11)
RDW: 14.1 % (ref 11.5–15.5)
WBC: 12.2 10*3/uL — ABNORMAL HIGH (ref 4.0–10.5)
nRBC: 0 % (ref 0.0–0.2)

## 2019-07-09 LAB — PREGNANCY, URINE: Preg Test, Ur: NEGATIVE

## 2019-07-09 LAB — TROPONIN I (HIGH SENSITIVITY)
Troponin I (High Sensitivity): <2 ng/L (ref <18)
Troponin I (High Sensitivity): <2 ng/L (ref <18)

## 2019-07-09 LAB — SARS CORONAVIRUS 2 AG (30 MIN TAT): SARS Coronavirus 2 Ag: NEGATIVE

## 2019-07-09 LAB — BRAIN NATRIURETIC PEPTIDE: B Natriuretic Peptide: 19.9 pg/mL (ref 0.0–100.0)

## 2019-07-09 MED ORDER — OXYCODONE-ACETAMINOPHEN 5-325 MG PO TABS
1.0000 | ORAL_TABLET | Freq: Once | ORAL | Status: AC
  Administered 2019-07-09: 1 via ORAL
  Filled 2019-07-09: qty 1

## 2019-07-09 NOTE — ED Provider Notes (Signed)
[facility] EMERGENCY DEPARTMENT Provider Note   CSN: [id number] Arrival date & time: 07/08/19  2326     History Chief Complaint  Patient presents with  . Chest Pain    Felicia Molina is a 49 y.o. female.  HPI    This a 49 year old female with a history of asthma, fibromyalgia, interstitial cystitis, chronic pain who presents with chest pain.  Patient reports 2-day history of ongoing anterior chest pain.  She describes it as stabbing and nonradiating.  It has been constant.  She takes Percocet which helps some but currently she rates her pain at 10 out of 10.  States pain is worse with coughing.  She states that she has had a cough productive of white mucus and has noted some lower leg swelling.  She has not noted any fevers.  No known Covid contacts.  She does report a child at home has pneumonia.  Past Medical History:  Diagnosis Date  . Asthma   . Atypical chest pain 07/28/2018  . Back pain, chronic   . Fibromyalgia   . Hypertension   . Morbid obesity ([place])   . Neurogenic bladder   . Pain management contract signed   . Palpitations 07/28/2018  . Panic attacks   . Reflux     Patient Active Problem List   Diagnosis Date Noted  . Atypical chest pain 07/28/2018  . Palpitations 07/28/2018  . Right wrist pain 11/08/2017  . Asthma 10/14/2017  . Depression 10/14/2017  . GERD (gastroesophageal reflux disease) 10/14/2017  . Fibroid 09/09/2017  . Morbid obesity with BMI of 50.0-59.9, adult ([place]) 08/05/2016  . Rheumatoid arthritis ([place]) 08/05/2016  . Chronic bilateral low back pain without sciatica 05/13/2015  . Plantar fasciitis, bilateral 07/19/2012  . Metatarsalgia of both feet 07/19/2012  . Acute respiratory failure ([place]) 05/07/2012  . Hypertension   . CAP (community acquired pneumonia) 05/06/2012    Past Surgical History:  Procedure Laterality Date  . BACK SURGERY    . bladder stimulator    . CARPAL TUNNEL RELEASE    . TONSILLECTOMY    . URETERAL STENT  PLACEMENT  2008     OB History    Gravida  6   Para  4   Term  4   Preterm  0   AB  2   Living  4     SAB  2   TAB  0   Ectopic  0   Multiple  0   Live Births  4           Family History  Problem Relation Age of Onset  . Heart attack Mother   . Heart disease Father   . Heart attack Father   . Heart disease Sister     Social History   Tobacco Use  . Smoking status: Former Research scientist (life sciences)  . Smokeless tobacco: Never Used  . Tobacco comment: quit 2004  Substance Use Topics  . Alcohol use: No  . Drug use: No    Home Medications Prior to Admission medications   Medication Sig Start Date End Date Taking? Authorizing Provider  albuterol (VENTOLIN HFA) 108 (90 Base) MCG/ACT inhaler Inhale 2 puffs into the lungs every 4 (four) hours as needed for wheezing or shortness of breath. 04/07/19   Noemi Chapel, MD  atenolol-chlorthalidone (TENORETIC) 100-25 MG tablet Take by mouth. 10/14/17   [provider]  budesonide-formoterol (SYMBICORT) 160-4.5 MCG/ACT inhaler Inhale 2 puffs into the lungs 2 (two) times daily.  [provider]  buPROPion (WELLBUTRIN SR) 150 MG 12 hr tablet Take by mouth. 10/14/17   [provider]  cetirizine (ZYRTEC) 10 MG tablet TK 1 T PO DAILY 10/14/17   [provider]  cyclobenzaprine (FLEXERIL) 10 MG tablet Take 10 mg by mouth 3 (three) times daily as needed. Takes for muscle spasms    [provider]  diclofenac sodium (VOLTAREN) 1 % GEL Apply 2 g topically 4 (four) times daily.    [provider]  fluticasone (FLONASE) 50 MCG/ACT nasal spray 2 SPRAYS INTO EACH NOSTRIL DAILY 10/14/17   [provider]  furosemide (LASIX) 40 MG tablet Take 1 tablet (40 mg total) by mouth daily. 05/09/12   Verlee Monte, MD  hydrALAZINE (APRESOLINE) 25 MG tablet Take 100 mg by mouth daily.    [provider]  ipratropium (ATROVENT) 0.06 % nasal spray Place 2 sprays into the nose 4 (four) times  daily. 03/04/13   Billy Fischer, MD  losartan (COZAAR) 100 MG tablet TK 1 T PO DAILY 10/14/17   [provider]  montelukast (SINGULAIR) 10 MG tablet Take 10 mg by mouth at bedtime.    [provider]  norethindrone (MICRONOR,CAMILA,ERRIN) 0.35 MG tablet  09/09/17   [provider]  OMEPRAZOLE PO Take 40 mg by mouth daily.     [provider]  oxymorphone (OPANA ER) 20 MG 12 hr tablet Take 20 mg by mouth every 12 (twelve) hours.    [provider]  potassium chloride SA (K-DUR,KLOR-CON) 20 MEQ tablet Take 1 tablet (20 mEq total) by mouth daily. 09/03/17   Drenda Freeze, MD  predniSONE (DELTASONE) 20 MG tablet Take 2 tablets (40 mg total) by mouth daily. Patient not taking: Reported on 05/18/2019 04/07/19   Noemi Chapel, MD  pregabalin (LYRICA) 75 MG capsule Take 75 mg by mouth 3 (three) times daily.    [provider]  rosuvastatin (CRESTOR) 20 MG tablet TK 1 T PO  Q NIGHT 10/14/17   [provider]  potassium chloride (K-DUR) 10 MEQ tablet Take 1 tablet (10 mEq total) by mouth daily. 10/11/18 01/06/19  Petrucelli, Samantha R, PA-C    Allergies    Clindamycin/lincomycin, Ibuprofen, Tomato, and Vicodin [hydrocodone-acetaminophen]  Review of Systems   Review of Systems  Constitutional: Negative for fever.  Respiratory: Positive for cough. Negative for shortness of breath.   Cardiovascular: Positive for chest pain and leg swelling.  Gastrointestinal: Negative for abdominal pain, nausea and vomiting.  Genitourinary: Negative for dysuria.  All other systems reviewed and are negative.   Physical Exam Updated Vital Signs BP (!) 115/58 (BP Location: Right Arm)   Pulse 74   Temp 98.5 F (36.9 C) (Oral)   Resp 18   Ht 1.524 m (5')   Wt 129.7 kg   LMP 07/03/2019 (Exact Date)   SpO2 100%   BMI 55.86 kg/m   Physical Exam Vitals and nursing note reviewed.  Constitutional:      Appearance: She is well-developed. She is obese.  She is not ill-appearing.  HENT:     Head: Normocephalic and atraumatic.  Eyes:     Pupils: Pupils are equal, round, and reactive to light.  Cardiovascular:     Rate and Rhythm: Normal rate and regular rhythm.     Heart sounds: Normal heart sounds.  Pulmonary:     Effort: Pulmonary effort is normal. No respiratory distress.     Breath sounds: No wheezing.     Comments:  Limited secondary to body habitus Chest:     Chest wall: Tenderness present.  Abdominal:     General: Bowel sounds are normal.     Palpations: Abdomen is soft.  Musculoskeletal:     Cervical back: Neck supple.     Right lower leg: Edema present.     Left lower leg: Edema present.     Comments: Trace bilateral lower extremity edema  Skin:    General: Skin is warm and dry.  Neurological:     Mental Status: She is alert and oriented to person, place, and time.  Psychiatric:        Mood and Affect: Mood normal.     ED Results / Procedures / Treatments   Labs (all labs ordered are listed, but only abnormal results are displayed) Labs Reviewed  BASIC METABOLIC PANEL - Abnormal; Notable for the following components:      Result Value   Potassium 3.3 (*)    Glucose, Bld 107 (*)    Creatinine, Ser 1.34 (*)    GFR calc non Af Amer 46 (*)    GFR calc Af Amer 54 (*)    All other components within normal limits  CBC - Abnormal; Notable for the following components:   WBC 12.2 (*)    Hemoglobin 11.5 (*)    HCT 35.5 (*)    All other components within normal limits  SARS CORONAVIRUS 2 AG (30 MIN TAT)  PREGNANCY, URINE  BRAIN NATRIURETIC PEPTIDE  TROPONIN I (HIGH SENSITIVITY)  TROPONIN I (HIGH SENSITIVITY)    EKG EKG Interpretation  Date/Time:  Sunday July 08 2019 23:41:40 EDT Ventricular Rate:  70 PR Interval:    QRS Duration: 85 QT Interval:  441 QTC Calculation: 476 R Axis:   18 Text Interpretation: Sinus rhythm Low voltage, precordial leads Confirmed by Thayer Jew [phone]) on 07/08/2019 11:43:30  PM   Radiology DG Chest 2 View  Result Date: 07/09/2019 CLINICAL DATA:  Chest pain EXAM: CHEST - 2 VIEW COMPARISON:  Radiograph 04/15/2019 FINDINGS: No consolidation, features of edema, pneumothorax, or effusion. Pulmonary vascularity is normally distributed. The cardiomediastinal contours are unremarkable. No acute osseous or soft tissue abnormality. Degenerative changes are present in the imaged spine and shoulders. Telemetry leads overlie the chest. IMPRESSION: No acute cardiopulmonary abnormality. Electronically Signed   By: Lovena Le M.D.   On: 07/09/2019 01:01    Procedures Procedures (including critical care time)  Medications Ordered in ED Medications  sodium chloride flush (NS) 0.9 % injection 3 mL (has no administration in time range)  oxyCODONE-acetaminophen (PERCOCET/ROXICET) 5-325 MG per tablet 1 tablet (1 tablet Oral Given 07/09/19 0059)    ED Course  I have reviewed the triage vital signs and the nursing notes.  Pertinent labs & imaging results that were available during my care of the patient were reviewed by me and considered in my medical decision making (see chart for details).    MDM Rules/Calculators/A&P HEAR Score: 2                     Patient presents with chest pain and cough.  Pain is atypical in nature.  She is overall nontoxic-appearing and vital signs are largely reassuring.  She is afebrile and satting 100% on room air.  EKG shows no evidence of acute ischemia.  Chest x-ray shows no evidence of pneumothorax or pneumonia.  She is PERC neg. low suspicion for PE at this time.  Pain is reproducible on exam.  Troponin  x2 -.  Suspect viral URI causing cough and musculoskeletal chest pain.  Recommend supportive measures at home.  Covid testing negative.  Heart score of 2.  Felicia Molina was evaluated in Emergency Department on 07/09/2019 for the symptoms described in the history of present illness. She was evaluated in the context of the global COVID-19  pandemic, which necessitated consideration that the patient might be at risk for infection with the SARS-CoV-2 virus that causes COVID-19. Institutional protocols and algorithms that pertain to the evaluation of patients at risk for COVID-19 are in a state of rapid change based on information released by regulatory bodies including the CDC and federal and state organizations. These policies and algorithms were followed during the patient's care in the ED.  After history, exam, and medical workup I feel the patient has been appropriately medically screened and is safe for discharge home. Pertinent diagnoses were discussed with the patient. Patient was given return precautions.   Final Clinical Impression(s) / ED Diagnoses Final diagnoses:  Atypical chest pain  Cough    Rx / DC Orders ED Discharge Orders    None       Ledonna Dormer, Barbette Hair, MD 07/09/19 [phone]

## 2019-07-09 NOTE — ED Notes (Signed)
Taken to xray at this time. 

## 2019-07-17 ENCOUNTER — Telehealth: Payer: Self-pay

## 2019-08-08 ENCOUNTER — Encounter (HOSPITAL_BASED_OUTPATIENT_CLINIC_OR_DEPARTMENT_OTHER): Payer: Self-pay

## 2019-08-08 ENCOUNTER — Other Ambulatory Visit: Payer: Self-pay

## 2019-08-08 ENCOUNTER — Emergency Department (HOSPITAL_BASED_OUTPATIENT_CLINIC_OR_DEPARTMENT_OTHER)
Admission: EM | Admit: 2019-08-08 | Discharge: 2019-08-08 | Disposition: A | Discharge status: Home / Self Care | Payer: Medicaid Other | Attending: Emergency Medicine | Admitting: Emergency Medicine

## 2019-08-08 ENCOUNTER — Emergency Department (HOSPITAL_BASED_OUTPATIENT_CLINIC_OR_DEPARTMENT_OTHER): Payer: Medicaid Other

## 2019-08-08 DIAGNOSIS — Z79899 Other long term (current) drug therapy: Secondary | ICD-10-CM | POA: Insufficient documentation

## 2019-08-08 DIAGNOSIS — Y929 Unspecified place or not applicable: Secondary | ICD-10-CM | POA: Insufficient documentation

## 2019-08-08 DIAGNOSIS — J45909 Unspecified asthma, uncomplicated: Secondary | ICD-10-CM | POA: Insufficient documentation

## 2019-08-08 DIAGNOSIS — Y999 Unspecified external cause status: Secondary | ICD-10-CM | POA: Diagnosis not present

## 2019-08-08 DIAGNOSIS — Y939 Activity, unspecified: Secondary | ICD-10-CM | POA: Diagnosis not present

## 2019-08-08 DIAGNOSIS — N898 Other specified noninflammatory disorders of vagina: Secondary | ICD-10-CM | POA: Insufficient documentation

## 2019-08-08 DIAGNOSIS — S61412A Laceration without foreign body of left hand, initial encounter: Secondary | ICD-10-CM | POA: Insufficient documentation

## 2019-08-08 DIAGNOSIS — Z87891 Personal history of nicotine dependence: Secondary | ICD-10-CM | POA: Insufficient documentation

## 2019-08-08 DIAGNOSIS — W25XXXA Contact with sharp glass, initial encounter: Secondary | ICD-10-CM | POA: Diagnosis not present

## 2019-08-08 DIAGNOSIS — I10 Essential (primary) hypertension: Secondary | ICD-10-CM | POA: Insufficient documentation

## 2019-08-08 MED ORDER — FLUCONAZOLE 150 MG PO TABS
150.0000 mg | ORAL_TABLET | Freq: Once | ORAL | Status: AC
  Administered 2019-08-08: 150 mg via ORAL
  Filled 2019-08-08: qty 1

## 2019-08-08 NOTE — ED Triage Notes (Signed)
Pt c/o glass in right hand ~10 min PTA-also c/o vaginal d/c x 3 days-NAD-steady gait

## 2019-08-08 NOTE — ED Provider Notes (Signed)
[facility] EMERGENCY DEPARTMENT Provider Note   CSN: [id number] Arrival date & time: 08/08/19  1901     History Chief Complaint  Patient presents with  . Foreign Body in Skin  . Vaginal Discharge    Felicia Molina is a 49 y.o. female.  49  Yo F with a chief complaints of glass embedded into her palm.  Patient's unfortunately struck a piece of glass on a chair and thinks it got stuck in the palm of her hand.  A Speigner bit came out but she thinks more may be stuck in there still.  This happened just prior to arrival.  She also is complaining of vaginal discharge.  Very similar when she had a yeast infection in the past.  Started after she took a bubble bath couple days ago.  Patient has a complicated history with a bladder stimulator and has had issues off and on with vaginal discharge.  Most recently had a round of antibiotics and antifungal medicines with some improvement.  The history is provided by the patient.  Vaginal Discharge Associated symptoms: no dysuria, no fever, no nausea and no vomiting   Illness Severity:  Moderate Onset quality:  Gradual Duration:  2 days Timing:  Constant Progression:  Worsening Chronicity:  New Associated symptoms: no chest pain, no congestion, no fever, no headaches, no myalgias, no nausea, no rhinorrhea, no shortness of breath, no vomiting and no wheezing        Past Medical History:  Diagnosis Date  . Asthma   . Atypical chest pain 07/28/2018  . Back pain, chronic   . Fibromyalgia   . Hypertension   . Morbid obesity ([place])   . Neurogenic bladder   . Pain management contract signed   . Palpitations 07/28/2018  . Panic attacks   . Reflux     Patient Active Problem List   Diagnosis Date Noted  . Atypical chest pain 07/28/2018  . Palpitations 07/28/2018  . Right wrist pain 11/08/2017  . Asthma 10/14/2017  . Depression 10/14/2017  . GERD (gastroesophageal reflux disease) 10/14/2017  . Fibroid 09/09/2017  . Morbid obesity  with BMI of 50.0-59.9, adult ([place]) 08/05/2016  . Rheumatoid arthritis ([place]) 08/05/2016  . Chronic bilateral low back pain without sciatica 05/13/2015  . Plantar fasciitis, bilateral 07/19/2012  . Metatarsalgia of both feet 07/19/2012  . Acute respiratory failure ([place]) 05/07/2012  . Hypertension   . CAP (community acquired pneumonia) 05/06/2012    Past Surgical History:  Procedure Laterality Date  . BACK SURGERY    . bladder stimulator    . CARPAL TUNNEL RELEASE    . TONSILLECTOMY    . URETERAL STENT PLACEMENT  2008     OB History    Gravida  6   Para  4   Term  4   Preterm  0   AB  2   Living  4     SAB  2   TAB  0   Ectopic  0   Multiple  0   Live Births  4           Family History  Problem Relation Age of Onset  . Heart attack Mother   . Heart disease Father   . Heart attack Father   . Heart disease Sister     Social History   Tobacco Use  . Smoking status: Former Research scientist (life sciences)  . Smokeless tobacco: Never Used  Substance Use Topics  . Alcohol use: No  . Drug  use: No    Home Medications Prior to Admission medications   Medication Sig Start Date End Date Taking? Authorizing Provider  albuterol (VENTOLIN HFA) 108 (90 Base) MCG/ACT inhaler Inhale 2 puffs into the lungs every 4 (four) hours as needed for wheezing or shortness of breath. 04/07/19   Noemi Chapel, MD  atenolol-chlorthalidone (TENORETIC) 100-25 MG tablet Take by mouth. 10/14/17   [provider]  budesonide-formoterol (SYMBICORT) 160-4.5 MCG/ACT inhaler Inhale 2 puffs into the lungs 2 (two) times daily.    [provider]  buPROPion (WELLBUTRIN SR) 150 MG 12 hr tablet Take by mouth. 10/14/17   [provider]  cetirizine (ZYRTEC) 10 MG tablet TK 1 T PO DAILY 10/14/17   [provider]  cyclobenzaprine (FLEXERIL) 10 MG tablet Take 10 mg by mouth 3 (three) times daily as needed. Takes for muscle spasms    [provider]  diclofenac sodium  (VOLTAREN) 1 % GEL Apply 2 g topically 4 (four) times daily.    [provider]  fluticasone (FLONASE) 50 MCG/ACT nasal spray 2 SPRAYS INTO EACH NOSTRIL DAILY 10/14/17   [provider]  furosemide (LASIX) 40 MG tablet Take 1 tablet (40 mg total) by mouth daily. 05/09/12   Verlee Monte, MD  hydrALAZINE (APRESOLINE) 25 MG tablet Take 100 mg by mouth daily.    [provider]  ipratropium (ATROVENT) 0.06 % nasal spray Place 2 sprays into the nose 4 (four) times daily. 03/04/13   Billy Fischer, MD  losartan (COZAAR) 100 MG tablet TK 1 T PO DAILY 10/14/17   [provider]  montelukast (SINGULAIR) 10 MG tablet Take 10 mg by mouth at bedtime.    [provider]  norethindrone (MICRONOR,CAMILA,ERRIN) 0.35 MG tablet  09/09/17   [provider]  OMEPRAZOLE PO Take 40 mg by mouth daily.     [provider]  oxymorphone (OPANA ER) 20 MG 12 hr tablet Take 20 mg by mouth every 12 (twelve) hours.    [provider]  potassium chloride SA (K-DUR,KLOR-CON) 20 MEQ tablet Take 1 tablet (20 mEq total) by mouth daily. 09/03/17   Drenda Freeze, MD  predniSONE (DELTASONE) 20 MG tablet Take 2 tablets (40 mg total) by mouth daily. Patient not taking: Reported on 05/18/2019 04/07/19   Noemi Chapel, MD  pregabalin (LYRICA) 75 MG capsule Take 75 mg by mouth 3 (three) times daily.    [provider]  rosuvastatin (CRESTOR) 20 MG tablet TK 1 T PO  Q NIGHT 10/14/17   [provider]  potassium chloride (K-DUR) 10 MEQ tablet Take 1 tablet (10 mEq total) by mouth daily. 10/11/18 01/06/19  Petrucelli, Samantha R, PA-C    Allergies    Clindamycin/lincomycin, Ibuprofen, Tomato, and Vicodin [hydrocodone-acetaminophen]  Review of Systems   Review of Systems  Constitutional: Negative for chills and fever.  HENT: Negative for congestion and rhinorrhea.   Eyes: Negative for redness and visual disturbance.  Respiratory: Negative for shortness  of breath and wheezing.   Cardiovascular: Negative for chest pain and palpitations.  Gastrointestinal: Negative for nausea and vomiting.  Genitourinary: Positive for vaginal discharge. Negative for dysuria and urgency.  Musculoskeletal: Negative for arthralgias and myalgias.  Skin: Positive for wound. Negative for pallor.  Neurological: Negative for dizziness and headaches.    Physical Exam Updated Vital Signs BP 117/73 (BP Location: Left Arm)   Pulse 83   Temp 98.1 F (36.7 C) (Oral)   Resp 18   Ht 5' (1.524  m)   Wt 129.7 kg   LMP 07/03/2019   SpO2 100%   BMI 55.86 kg/m   Physical Exam Vitals and nursing note reviewed.  Constitutional:      General: She is not in acute distress.    Appearance: She is well-developed. She is morbidly obese. She is not diaphoretic.    HENT:     Head: Normocephalic and atraumatic.  Eyes:     Pupils: Pupils are equal, round, and reactive to light.  Cardiovascular:     Rate and Rhythm: Normal rate and regular rhythm.     Heart sounds: No murmur. No friction rub. No gallop.   Pulmonary:     Effort: Pulmonary effort is normal.     Breath sounds: No wheezing or rales.  Abdominal:     General: There is no distension.     Palpations: Abdomen is soft.     Tenderness: There is no abdominal tenderness.  Musculoskeletal:        General: No tenderness.     Cervical back: Normal range of motion and neck supple.  Skin:    General: Skin is warm and dry.  Neurological:     Mental Status: She is alert and oriented to person, place, and time.  Psychiatric:        Behavior: Behavior normal.     ED Results / Procedures / Treatments   Labs (all labs ordered are listed, but only abnormal results are displayed) Labs Reviewed - No data to display  EKG None  Radiology DG Hand Complete Right  Result Date: 08/08/2019 CLINICAL DATA:  Cut on glass EXAM: RIGHT HAND - COMPLETE 3+ VIEW COMPARISON:  2019 FINDINGS: There is no acute fracture or  dislocation. Joint spaces are preserved. No radiopaque foreign body identified. IMPRESSION: No radiopaque foreign body. Electronically Signed   By: Macy Mis M.D.   On: 08/08/2019 20:19    Procedures Procedures (including critical care time)  Medications Ordered in ED Medications  fluconazole (DIFLUCAN) tablet 150 mg (150 mg Oral Given 08/08/19 2002)    ED Course  I have reviewed the triage vital signs and the nursing notes.  Pertinent labs & imaging results that were available during my care of the patient were reviewed by me and considered in my medical decision making (see chart for details).    MDM Rules/Calculators/A&P                      49 yo F with a chief complaint of possible foreign body to the palm of the right hand and vaginal discharge.  Patient unfortunately has had vaginal discharge off and on ever since having a bladder stimulator implanted.  Just finished a round of antibiotics and antifungals and then had recurrence after having a bubble bath.  Discussed with her about empiric treatment versus exam.  We will go with empiric treatment for yeast infection.  She has follow-up this week with her doctor who implanted the stimulator.  Plain film of the hand viewed by me without fracture or foreign body.  Discharge home.  9:00 PM:  I have discussed the diagnosis/risks/treatment options with the patient and believe the pt to be eligible for discharge home to follow-up with PCP. We also discussed returning to the ED immediately if new or worsening sx occur. We discussed the sx which are most concerning (e.g., sudden worsening pain, fever, inability to tolerate by mouth) that necessitate immediate return. Medications administered to the patient during their  visit and any new prescriptions provided to the patient are listed below.  Medications given during this visit Medications  fluconazole (DIFLUCAN) tablet 150 mg (150 mg Oral Given 08/08/19 2002)     The patient appears  reasonably screen and/or stabilized for discharge and I doubt any other medical condition or other [facility] requiring further screening, evaluation, or treatment in the ED at this time prior to discharge.   Final Clinical Impression(s) / ED Diagnoses Final diagnoses:  None    Rx / DC Orders ED Discharge Orders    None       Deno Etienne, DO 08/08/19 2100

## 2019-08-08 NOTE — Discharge Instructions (Signed)
Follow-up with your family doctor in the office.  Return for redness drainage or fever

## 2019-08-10 NOTE — Telephone Encounter (Signed)
error 

## 2019-09-17 ENCOUNTER — Other Ambulatory Visit: Payer: Self-pay

## 2019-09-17 ENCOUNTER — Encounter (HOSPITAL_BASED_OUTPATIENT_CLINIC_OR_DEPARTMENT_OTHER): Payer: Self-pay

## 2019-09-17 ENCOUNTER — Emergency Department (HOSPITAL_BASED_OUTPATIENT_CLINIC_OR_DEPARTMENT_OTHER)
Admission: EM | Admit: 2019-09-17 | Discharge: 2019-09-17 | Disposition: A | Discharge status: Home / Self Care | Payer: Medicaid Other | Attending: Emergency Medicine | Admitting: Emergency Medicine

## 2019-09-17 ENCOUNTER — Emergency Department (HOSPITAL_BASED_OUTPATIENT_CLINIC_OR_DEPARTMENT_OTHER): Payer: Medicaid Other

## 2019-09-17 DIAGNOSIS — Z79899 Other long term (current) drug therapy: Secondary | ICD-10-CM | POA: Insufficient documentation

## 2019-09-17 DIAGNOSIS — Z885 Allergy status to narcotic agent status: Secondary | ICD-10-CM | POA: Diagnosis not present

## 2019-09-17 DIAGNOSIS — J45909 Unspecified asthma, uncomplicated: Secondary | ICD-10-CM | POA: Insufficient documentation

## 2019-09-17 DIAGNOSIS — M25561 Pain in right knee: Secondary | ICD-10-CM

## 2019-09-17 DIAGNOSIS — Z91018 Allergy to other foods: Secondary | ICD-10-CM | POA: Diagnosis not present

## 2019-09-17 DIAGNOSIS — I10 Essential (primary) hypertension: Secondary | ICD-10-CM | POA: Diagnosis not present

## 2019-09-17 DIAGNOSIS — Z881 Allergy status to other antibiotic agents status: Secondary | ICD-10-CM | POA: Insufficient documentation

## 2019-09-17 DIAGNOSIS — Z87891 Personal history of nicotine dependence: Secondary | ICD-10-CM | POA: Diagnosis not present

## 2019-09-17 NOTE — ED Triage Notes (Signed)
Pt arrives with reports of pain to both knees after a fall on steps 2 days ago. Pt has knee supports on both legs reports pain with extension  and flexion.

## 2019-09-17 NOTE — ED Provider Notes (Signed)
[facility] Provider Note   CSN: [id number] Arrival date & time: 09/17/19  1106     History Chief Complaint  Patient presents with  . Knee Pain    Felicia Molina is a 49 y.o. female.  HPI 49 year old African-American female who has a past medical history significant for chronic pain on chronic pain medication presents to the ER for evaluation of right knee pain.  Patient reports 2 days ago she was carrying groceries up her stairs when she tripped slanting onto her right knee.  Patient states she has been able to ambulate since then however the pain has intensified.  She reports swelling to the right knee and difficulties with bending the knee.  Patient denies any numbness or tingling.  She has taken her 15 mg oxycodone without any significant relief of symptoms.  Patient denies any head injury or LOC.  Pain is worse with palpation, range of motion and ambulation.    Past Medical History:  Diagnosis Date  . Asthma   . Atypical chest pain 07/28/2018  . Back pain, chronic   . Fibromyalgia   . Hypertension   . Morbid obesity ([name])   . Neurogenic bladder   . Pain management contract signed   . Palpitations 07/28/2018  . Panic attacks   . Reflux     Patient Active Problem List   Diagnosis Date Noted  . Atypical chest pain 07/28/2018  . Palpitations 07/28/2018  . Right wrist pain 11/08/2017  . Asthma 10/14/2017  . Depression 10/14/2017  . GERD (gastroesophageal reflux disease) 10/14/2017  . Fibroid 09/09/2017  . Morbid obesity with BMI of 50.0-59.9, adult ([place]) 08/05/2016  . Rheumatoid arthritis ([place]) 08/05/2016  . Chronic bilateral low back pain without sciatica 05/13/2015  . Plantar fasciitis, bilateral 07/19/2012  . Metatarsalgia of both feet 07/19/2012  . Acute respiratory failure ([place]) 05/07/2012  . Hypertension   . CAP (community acquired pneumonia) 05/06/2012    Past Surgical History:  Procedure Laterality Date  . BACK SURGERY    .  bladder stimulator    . CARPAL TUNNEL RELEASE    . TONSILLECTOMY    . URETERAL STENT PLACEMENT  2008     OB History    Gravida  6   Para  4   Term  4   Preterm  0   AB  2   Living  4     SAB  2   TAB  0   Ectopic  0   Multiple  0   Live Births  4           Family History  Problem Relation Age of Onset  . Heart attack Mother   . Heart disease Father   . Heart attack Father   . Heart disease Sister     Social History   Tobacco Use  . Smoking status: Former Research scientist (life sciences)  . Smokeless tobacco: Never Used  Substance Use Topics  . Alcohol use: No  . Drug use: No    Home Medications Prior to Admission medications   Medication Sig Start Date End Date Taking? Authorizing Provider  albuterol (VENTOLIN HFA) 108 (90 Base) MCG/ACT inhaler Inhale 2 puffs into the lungs every 4 (four) hours as needed for wheezing or shortness of breath. 04/07/19   Noemi Chapel, MD  atenolol-chlorthalidone (TENORETIC) 100-25 MG tablet Take by mouth. 10/14/17   [provider]  budesonide-formoterol (SYMBICORT) 160-4.5 MCG/ACT inhaler Inhale 2 puffs into the lungs 2 (two) times daily.  [provider]  buPROPion (WELLBUTRIN SR) 150 MG 12 hr tablet Take by mouth. 10/14/17   [provider]  cetirizine (ZYRTEC) 10 MG tablet TK 1 T PO DAILY 10/14/17   [provider]  cyclobenzaprine (FLEXERIL) 10 MG tablet Take 10 mg by mouth 3 (three) times daily as needed. Takes for muscle spasms    [provider]  diclofenac sodium (VOLTAREN) 1 % GEL Apply 2 g topically 4 (four) times daily.    [provider]  fluticasone (FLONASE) 50 MCG/ACT nasal spray 2 SPRAYS INTO EACH NOSTRIL DAILY 10/14/17   [provider]  furosemide (LASIX) 40 MG tablet Take 1 tablet (40 mg total) by mouth daily. 05/09/12   Verlee Monte, MD  hydrALAZINE (APRESOLINE) 25 MG tablet Take 100 mg by mouth daily.    [provider]  ipratropium (ATROVENT) 0.06 %  nasal spray Place 2 sprays into the nose 4 (four) times daily. 03/04/13   Billy Fischer, MD  losartan (COZAAR) 100 MG tablet TK 1 T PO DAILY 10/14/17   [provider]  montelukast (SINGULAIR) 10 MG tablet Take 10 mg by mouth at bedtime.    [provider]  norethindrone (MICRONOR,CAMILA,ERRIN) 0.35 MG tablet  09/09/17   [provider]  OMEPRAZOLE PO Take 40 mg by mouth daily.     [provider]  oxymorphone (OPANA ER) 20 MG 12 hr tablet Take 20 mg by mouth every 12 (twelve) hours.    [provider]  potassium chloride SA (K-DUR,KLOR-CON) 20 MEQ tablet Take 1 tablet (20 mEq total) by mouth daily. 09/03/17   Drenda Freeze, MD  predniSONE (DELTASONE) 20 MG tablet Take 2 tablets (40 mg total) by mouth daily. Patient not taking: Reported on 05/18/2019 04/07/19   Noemi Chapel, MD  pregabalin (LYRICA) 75 MG capsule Take 75 mg by mouth 3 (three) times daily.    [provider]  rosuvastatin (CRESTOR) 20 MG tablet TK 1 T PO  Q NIGHT 10/14/17   [provider]  potassium chloride (K-DUR) 10 MEQ tablet Take 1 tablet (10 mEq total) by mouth daily. 10/11/18 01/06/19  Petrucelli, Samantha R, PA-C    Allergies    Clindamycin/lincomycin, Ibuprofen, Tomato, and Vicodin [hydrocodone-acetaminophen]  Review of Systems   Review of Systems  Constitutional: Negative for chills and fever.  HENT: Negative for congestion.   Eyes: Negative for discharge.  Respiratory: Negative for cough.   Musculoskeletal: Positive for arthralgias, joint swelling and myalgias.  Skin: Negative for color change.  Neurological: Negative for weakness and numbness.  Psychiatric/Behavioral: Negative for confusion.    Physical Exam Updated Vital Signs BP (!) 142/66 (BP Location: Right Arm)   Pulse 77   Temp 98.4 F (36.9 C) (Oral)   Resp 20   Ht 5' (1.524 m)   Wt 129.7 kg   LMP 08/09/2019   SpO2 100%   BMI 55.86 kg/m   Physical Exam Vitals and nursing note  reviewed.  Constitutional:      General: She is not in acute distress.    Appearance: She is well-developed. She is obese. She is not ill-appearing or toxic-appearing.  HENT:     Head: Normocephalic and atraumatic.  Eyes:     General: No scleral icterus.       Right eye: No discharge.        Left eye: No discharge.  Cardiovascular:     Pulses: Normal pulses.  Pulmonary:     Effort: No respiratory distress.  Musculoskeletal:     Cervical back: Normal range of motion.     Comments: Patient with decreased range of motion to the right knee.  No obvious effusion noted.  No obvious edema.  No ecchymosis, erythema, warmth, wound.  Tender to palpation over the patella tendon and popliteal fossa.  Patient has no joint laxity with valgus and varus stress.  Negative anterior drawer test.  DP pulses are 2+.  Capillary refill is normal.  Sensation is intact.  Skin:    General: Skin is warm and dry.     Coloration: Skin is not pale.  Neurological:     Mental Status: She is alert.     Sensory: No sensory deficit.     Motor: No weakness.  Psychiatric:        Behavior: Behavior normal.        Thought Content: Thought content normal.        Judgment: Judgment normal.     ED Results / Procedures / Treatments   Labs (all labs ordered are listed, but only abnormal results are displayed) Labs Reviewed - No data to display  EKG None  Radiology DG Knee Complete 4 Views Right  Result Date: 09/17/2019 CLINICAL DATA:  Bilateral knee pain since falling 2 days ago. EXAM: RIGHT KNEE - COMPLETE 4+ VIEW COMPARISON:  Radiographs 01/30/2016. FINDINGS: There is rotation on the lateral view. The mineralization and alignment are normal. There is no evidence of acute fracture or dislocation. There are progressive tricompartmental degenerative changes with joint space narrowing and osteophytes. No large joint effusion or focal soft tissue abnormality identified. IMPRESSION: No acute osseous findings identified;  lateral view is rotated. Progressive tricompartmental degenerative changes. Electronically Signed   By: Richardean Sale M.D.   On: 09/17/2019 12:03    Procedures Procedures (including critical care time)  Medications Ordered in ED Medications - No data to display  ED Course  I have reviewed the triage vital signs and the nursing notes.  Pertinent labs & imaging results that were available during my care of the patient were reviewed by me and considered in my medical decision making (see chart for details).    MDM Rules/Calculators/A&P                      Patient X-Ray negative for obvious fracture or dislocation. Pain managed in ED. Pt advised to follow up with orthopedics if symptoms persist for possibility of missed fracture diagnosis. Patient given brace while in ED, conservative therapy recommended and discussed. Patient will be dc home & is agreeable with above plan.  Final Clinical Impression(s) / ED Diagnoses Final diagnoses:  Acute pain of right knee    Rx / DC Orders ED Discharge Orders    None       Aaron Edelman 09/17/19 1218    Virgel Manifold, MD 09/17/19 1335

## 2019-09-17 NOTE — Discharge Instructions (Addendum)
X-ray showed no fracture.  This is likely a sprain.  You can apply topical lidocaine patches. Rest, ice, elevated the area, follow up with ortho for ongoing pain

## 2019-11-01 ENCOUNTER — Emergency Department (HOSPITAL_BASED_OUTPATIENT_CLINIC_OR_DEPARTMENT_OTHER)
Admission: EM | Admit: 2019-11-01 | Discharge: 2019-11-01 | Disposition: A | Discharge status: Home / Self Care | Payer: Medicaid Other | Attending: Emergency Medicine | Admitting: Emergency Medicine

## 2019-11-01 ENCOUNTER — Encounter (HOSPITAL_BASED_OUTPATIENT_CLINIC_OR_DEPARTMENT_OTHER): Payer: Self-pay | Admitting: *Deleted

## 2019-11-01 ENCOUNTER — Other Ambulatory Visit: Payer: Self-pay

## 2019-11-01 DIAGNOSIS — Z79899 Other long term (current) drug therapy: Secondary | ICD-10-CM | POA: Diagnosis not present

## 2019-11-01 DIAGNOSIS — J45909 Unspecified asthma, uncomplicated: Secondary | ICD-10-CM | POA: Diagnosis not present

## 2019-11-01 DIAGNOSIS — Z87891 Personal history of nicotine dependence: Secondary | ICD-10-CM | POA: Insufficient documentation

## 2019-11-01 DIAGNOSIS — R21 Rash and other nonspecific skin eruption: Secondary | ICD-10-CM | POA: Diagnosis present

## 2019-11-01 DIAGNOSIS — Z7951 Long term (current) use of inhaled steroids: Secondary | ICD-10-CM | POA: Diagnosis not present

## 2019-11-01 DIAGNOSIS — I10 Essential (primary) hypertension: Secondary | ICD-10-CM | POA: Diagnosis not present

## 2019-11-01 MED ORDER — PREDNISONE 20 MG PO TABS: 20.0000 mg | ORAL_TABLET | Freq: Every day | ORAL | 0 refills | Status: AC

## 2019-11-01 NOTE — ED Provider Notes (Signed)
[facility] EMERGENCY DEPARTMENT Provider Note   CSN: [id number] Arrival date & time: 11/01/19  2154     History Chief Complaint  Patient presents with  . Rash    Felicia Molina is a 49 y.o. female.  The history is provided by the patient.  Rash Location:  Full body Quality: itchiness and redness   Severity:  Mild Onset quality:  Gradual Timing:  Constant Progression:  Unchanged Chronicity:  New Context comment:  Eczema hx, rash and itching over last few days. No fever Relieved by:  Antihistamines Associated symptoms: no fatigue, no fever, no joint pain and no myalgias        Past Medical History:  Diagnosis Date  . Asthma   . Atypical chest pain 07/28/2018  . Back pain, chronic   . Fibromyalgia   . Hypertension   . Morbid obesity ([place])   . Neurogenic bladder   . Pain management contract signed   . Palpitations 07/28/2018  . Panic attacks   . Reflux     Patient Active Problem List   Diagnosis Date Noted  . Atypical chest pain 07/28/2018  . Palpitations 07/28/2018  . Right wrist pain 11/08/2017  . Asthma 10/14/2017  . Depression 10/14/2017  . GERD (gastroesophageal reflux disease) 10/14/2017  . Fibroid 09/09/2017  . Morbid obesity with BMI of 50.0-59.9, adult ([place]) 08/05/2016  . Rheumatoid arthritis ([place]) 08/05/2016  . Chronic bilateral low back pain without sciatica 05/13/2015  . Plantar fasciitis, bilateral 07/19/2012  . Metatarsalgia of both feet 07/19/2012  . Acute respiratory failure ([place]) 05/07/2012  . Hypertension   . CAP (community acquired pneumonia) 05/06/2012    Past Surgical History:  Procedure Laterality Date  . BACK SURGERY    . bladder stimulator    . CARPAL TUNNEL RELEASE    . TONSILLECTOMY    . URETERAL STENT PLACEMENT  2008     OB History    Gravida  6   Para  4   Term  4   Preterm  0   AB  2   Living  4     SAB  2   TAB  0   Ectopic  0   Multiple  0   Live Births  4           Family History    Problem Relation Age of Onset  . Heart attack Mother   . Heart disease Father   . Heart attack Father   . Heart disease Sister     Social History   Tobacco Use  . Smoking status: Former Research scientist (life sciences)  . Smokeless tobacco: Never Used  Vaping Use  . Vaping Use: Never used  Substance Use Topics  . Alcohol use: No  . Drug use: No    Home Medications Prior to Admission medications   Medication Sig Start Date End Date Taking? Authorizing Provider  albuterol (VENTOLIN HFA) 108 (90 Base) MCG/ACT inhaler Inhale 2 puffs into the lungs every 4 (four) hours as needed for wheezing or shortness of breath. 04/07/19   Noemi Chapel, MD  atenolol-chlorthalidone (TENORETIC) 100-25 MG tablet Take by mouth. 10/14/17   [provider]  budesonide-formoterol (SYMBICORT) 160-4.5 MCG/ACT inhaler Inhale 2 puffs into the lungs 2 (two) times daily.    [provider]  buPROPion (WELLBUTRIN SR) 150 MG 12 hr tablet Take by mouth. 10/14/17   [provider]  cetirizine (ZYRTEC) 10 MG tablet TK 1 T PO DAILY 10/14/17   [provider]  cyclobenzaprine (FLEXERIL) 10 MG tablet Take 10 mg by mouth 3 (three) times daily as needed. Takes for muscle spasms    [provider]  diclofenac sodium (VOLTAREN) 1 % GEL Apply 2 g topically 4 (four) times daily.    [provider]  fluticasone (FLONASE) 50 MCG/ACT nasal spray 2 SPRAYS INTO EACH NOSTRIL DAILY 10/14/17   [provider]  furosemide (LASIX) 40 MG tablet Take 1 tablet (40 mg total) by mouth daily. 05/09/12   Verlee Monte, MD  hydrALAZINE (APRESOLINE) 25 MG tablet Take 100 mg by mouth daily.    [provider]  ipratropium (ATROVENT) 0.06 % nasal spray Place 2 sprays into the nose 4 (four) times daily. 03/04/13   Billy Fischer, MD  losartan (COZAAR) 100 MG tablet TK 1 T PO DAILY 10/14/17   [provider]  montelukast (SINGULAIR) 10 MG tablet Take 10 mg by mouth at bedtime.    [provider]  norethindrone (MICRONOR,CAMILA,ERRIN) 0.35 MG tablet  09/09/17   [provider]  OMEPRAZOLE PO Take 40 mg by mouth daily.     [provider]  oxymorphone (OPANA ER) 20 MG 12 hr tablet Take 20 mg by mouth every 12 (twelve) hours.    [provider]  potassium chloride SA (K-DUR,KLOR-CON) 20 MEQ tablet Take 1 tablet (20 mEq total) by mouth daily. 09/03/17   Drenda Freeze, MD  predniSONE (DELTASONE) 20 MG tablet Take 1 tablet (20 mg total) by mouth daily for 5 days. 11/01/19 11/06/19  Rahiem Schellinger, DO  pregabalin (LYRICA) 75 MG capsule Take 75 mg by mouth 3 (three) times daily.    [provider]  rosuvastatin (CRESTOR) 20 MG tablet TK 1 T PO  Q NIGHT 10/14/17   [provider]  potassium chloride (K-DUR) 10 MEQ tablet Take 1 tablet (10 mEq total) by mouth daily. 10/11/18 01/06/19  Petrucelli, Samantha R, PA-C    Allergies    Clindamycin/lincomycin, Ibuprofen, Tomato, and Vicodin [hydrocodone-acetaminophen]  Review of Systems   Review of Systems  Constitutional: Negative for fatigue and fever.  Musculoskeletal: Negative for arthralgias and myalgias.  Skin: Positive for rash. Negative for color change, pallor and wound.    Physical Exam Updated Vital Signs BP 136/71   Pulse 82   Temp 98.1 F (36.7 C) (Oral)   Resp 20   Ht 5' (1.524 m)   Wt 129.7 kg   SpO2 99%   BMI 55.84 kg/m   Physical Exam Constitutional:      General: She is not in acute distress.    Appearance: She is not ill-appearing.  HENT:     Mouth/Throat:     Mouth: Mucous membranes are moist.  Cardiovascular:     Pulses: Normal pulses.  Musculoskeletal:        General: No tenderness.  Skin:    Capillary Refill: Capillary refill takes less than 2 seconds.     Findings: Rash present.     Comments: Multiple 1 to 2 cm slightly raised lesions throughout her body, eczema type rash  Neurological:     Mental Status: She is alert.     ED Results /  Procedures / Treatments   Labs (all labs ordered are listed, but only abnormal results are displayed) Labs Reviewed - No data to display  EKG None  Radiology No results found.  Procedures Procedures (including critical care time)  Medications Ordered in ED Medications - No data to display  ED Course  I  have reviewed the triage vital signs and the nursing notes.  Pertinent labs & imaging results that were available during my care of the patient were reviewed by me and considered in my medical decision making (see chart for details).    MDM Rules/Calculators/A&P                          Felicia Molina is a 49 year old female history of eczema who presents to the ED with full body itching.  Normal vitals.  No fever.  Overall nonspecific rash throughout.  Possibly eczema type related rash.  No concern for Stevens-Johnson's or TEN.  Does not appear consistent with scabies.  Patient denies being pregnant.  Recent negative pregnancy test.  Will prescribe several days of steroids.  Recommend continued use of Benadryl.  We will have her follow-up for primary care doctor.  This chart was dictated using voice recognition software.  Despite best efforts to proofread,  errors can occur which can change the documentation meaning.    Final Clinical Impression(s) / ED Diagnoses Final diagnoses:  Rash    Rx / DC Orders ED Discharge Orders         Ordered    predniSONE (DELTASONE) 20 MG tablet  Daily     Discontinue     11/01/19 2256           Lennice Sites, DO 11/01/19 2256

## 2019-11-01 NOTE — ED Notes (Signed)
EDP in with Pt. At present time assessing the Pt. Complaints of skin rash.  Pt. In no distress.

## 2019-11-01 NOTE — ED Triage Notes (Signed)
Rash over her body for 2 days. Itching. She has been taking Benadryl. Hx of eczema.

## 2019-11-23 ENCOUNTER — Emergency Department (HOSPITAL_BASED_OUTPATIENT_CLINIC_OR_DEPARTMENT_OTHER)
Admission: EM | Admit: 2019-11-23 | Discharge: 2019-11-23 | Disposition: A | Discharge status: Home / Self Care | Payer: Medicaid Other | Attending: Emergency Medicine | Admitting: Emergency Medicine

## 2019-11-23 ENCOUNTER — Encounter (HOSPITAL_BASED_OUTPATIENT_CLINIC_OR_DEPARTMENT_OTHER): Payer: Self-pay | Admitting: *Deleted

## 2019-11-23 ENCOUNTER — Other Ambulatory Visit: Payer: Self-pay

## 2019-11-23 DIAGNOSIS — Z79899 Other long term (current) drug therapy: Secondary | ICD-10-CM | POA: Insufficient documentation

## 2019-11-23 DIAGNOSIS — J45909 Unspecified asthma, uncomplicated: Secondary | ICD-10-CM | POA: Insufficient documentation

## 2019-11-23 DIAGNOSIS — R519 Headache, unspecified: Secondary | ICD-10-CM | POA: Diagnosis present

## 2019-11-23 DIAGNOSIS — L03211 Cellulitis of face: Secondary | ICD-10-CM

## 2019-11-23 DIAGNOSIS — H9209 Otalgia, unspecified ear: Secondary | ICD-10-CM | POA: Diagnosis not present

## 2019-11-23 DIAGNOSIS — I10 Essential (primary) hypertension: Secondary | ICD-10-CM | POA: Diagnosis not present

## 2019-11-23 MED ORDER — AMOXICILLIN-POT CLAVULANATE 875-125 MG PO TABS: 1.0000 | ORAL_TABLET | Freq: Two times a day (BID) | ORAL | 0 refills | Status: DC

## 2019-11-23 MED ORDER — AMOXICILLIN-POT CLAVULANATE 875-125 MG PO TABS
1.0000 | ORAL_TABLET | Freq: Once | ORAL | Status: AC
  Administered 2019-11-23: 1 via ORAL
  Filled 2019-11-23: qty 1

## 2019-11-23 NOTE — Discharge Instructions (Addendum)
If you develop fever, vomiting, new or worsening facial swelling, neck stiffness, inability to swallow, or any other new/concerning symptoms then return to the ER for evaluation.

## 2019-11-23 NOTE — ED Triage Notes (Signed)
Pt c/o right ear pain x 2 days

## 2019-11-23 NOTE — ED Provider Notes (Signed)
[facility] EMERGENCY DEPARTMENT Provider Note   CSN: [id number] Arrival date & time: 11/23/19  1712     History Chief Complaint  Patient presents with  . Otalgia    Felicia Molina is a 49 y.o. female.  HPI 49 year old female presents with right facial pain.  Started this morning around 3 AM.  No fever but has pain and swelling to her face and pain in her ear and jaw.  Pain in her mouth as well.  She has no teeth left.  Has some pain with swallowing.  No neck pain or shortness of breath.  Pain is severe.  Her home dose oxycodone did temporarily help.  She is unable to take NSAIDs.     Past Medical History:  Diagnosis Date  . Asthma   . Atypical chest pain 07/28/2018  . Back pain, chronic   . Fibromyalgia   . Hypertension   . Morbid obesity ([place])   . Neurogenic bladder   . Pain management contract signed   . Palpitations 07/28/2018  . Panic attacks   . Reflux     Patient Active Problem List   Diagnosis Date Noted  . Atypical chest pain 07/28/2018  . Palpitations 07/28/2018  . Right wrist pain 11/08/2017  . Asthma 10/14/2017  . Depression 10/14/2017  . GERD (gastroesophageal reflux disease) 10/14/2017  . Fibroid 09/09/2017  . Morbid obesity with BMI of 50.0-59.9, adult ([place]) 08/05/2016  . Rheumatoid arthritis ([place]) 08/05/2016  . Chronic bilateral low back pain without sciatica 05/13/2015  . Plantar fasciitis, bilateral 07/19/2012  . Metatarsalgia of both feet 07/19/2012  . Acute respiratory failure ([place]) 05/07/2012  . Hypertension   . CAP (community acquired pneumonia) 05/06/2012    Past Surgical History:  Procedure Laterality Date  . BACK SURGERY    . bladder stimulator    . CARPAL TUNNEL RELEASE    . TONSILLECTOMY    . URETERAL STENT PLACEMENT  2008     OB History    Gravida  6   Para  4   Term  4   Preterm  0   AB  2   Living  4     SAB  2   TAB  0   Ectopic  0   Multiple  0   Live Births  4           Family History    Problem Relation Age of Onset  . Heart attack Mother   . Heart disease Father   . Heart attack Father   . Heart disease Sister     Social History   Tobacco Use  . Smoking status: Former Research scientist (life sciences)  . Smokeless tobacco: Never Used  Vaping Use  . Vaping Use: Never used  Substance Use Topics  . Alcohol use: No  . Drug use: No    Home Medications Prior to Admission medications   Medication Sig Start Date End Date Taking? Authorizing Provider  albuterol (VENTOLIN HFA) 108 (90 Base) MCG/ACT inhaler Inhale 2 puffs into the lungs every 4 (four) hours as needed for wheezing or shortness of breath. 04/07/19   Noemi Chapel, MD  atenolol-chlorthalidone (TENORETIC) 100-25 MG tablet Take by mouth. 10/14/17   [provider]  budesonide-formoterol (SYMBICORT) 160-4.5 MCG/ACT inhaler Inhale 2 puffs into the lungs 2 (two) times daily.    [provider]  buPROPion (WELLBUTRIN SR) 150 MG 12 hr tablet Take by mouth. 10/14/17   [provider]  cetirizine (ZYRTEC) 10 MG tablet  TK 1 T PO DAILY 10/14/17   [provider]  cyclobenzaprine (FLEXERIL) 10 MG tablet Take 10 mg by mouth 3 (three) times daily as needed. Takes for muscle spasms    [provider]  diclofenac sodium (VOLTAREN) 1 % GEL Apply 2 g topically 4 (four) times daily.    [provider]  fluticasone (FLONASE) 50 MCG/ACT nasal spray 2 SPRAYS INTO EACH NOSTRIL DAILY 10/14/17   [provider]  furosemide (LASIX) 40 MG tablet Take 1 tablet (40 mg total) by mouth daily. 05/09/12   Verlee Monte, MD  hydrALAZINE (APRESOLINE) 25 MG tablet Take 100 mg by mouth daily.    [provider]  ipratropium (ATROVENT) 0.06 % nasal spray Place 2 sprays into the nose 4 (four) times daily. 03/04/13   Billy Fischer, MD  losartan (COZAAR) 100 MG tablet TK 1 T PO DAILY 10/14/17   [provider]  montelukast (SINGULAIR) 10 MG tablet Take 10 mg by mouth at bedtime.    [provider]  norethindrone (MICRONOR,CAMILA,ERRIN) 0.35 MG tablet  09/09/17   [provider]  OMEPRAZOLE PO Take 40 mg by mouth daily.     [provider]  oxymorphone (OPANA ER) 20 MG 12 hr tablet Take 20 mg by mouth every 12 (twelve) hours.    [provider]  potassium chloride SA (K-DUR,KLOR-CON) 20 MEQ tablet Take 1 tablet (20 mEq total) by mouth daily. 09/03/17   Drenda Freeze, MD  pregabalin (LYRICA) 75 MG capsule Take 75 mg by mouth 3 (three) times daily.    [provider]  rosuvastatin (CRESTOR) 20 MG tablet TK 1 T PO  Q NIGHT 10/14/17   [provider]  potassium chloride (K-DUR) 10 MEQ tablet Take 1 tablet (10 mEq total) by mouth daily. 10/11/18 01/06/19  Petrucelli, Samantha R, PA-C    Allergies    Clindamycin/lincomycin, Ibuprofen, Tomato, and Vicodin [hydrocodone-acetaminophen]  Review of Systems   Review of Systems  Constitutional: Negative for fever.  HENT: Positive for facial swelling.   Gastrointestinal: Negative for vomiting.  Musculoskeletal: Negative for neck pain.    Physical Exam Updated Vital Signs BP (!) 105/90   Pulse 78   Temp 98.6 F (37 C)   Resp 18   Ht 5' (1.524 m)   Wt (!) 129.7 kg   SpO2 99%   BMI 55.86 kg/m   Physical Exam Vitals and nursing note reviewed.  Constitutional:      Appearance: She is well-developed. She is obese.  HENT:     Head: Normocephalic and atraumatic.      Comments: No obvoius oral lesions though she is diffusely tender in right mandibular gum line and inner cheek.    Right Ear: Tympanic membrane, ear canal and external ear normal.     Left Ear: Tympanic membrane, ear canal and external ear normal.     Nose: Nose normal.  Eyes:     General:        Right eye: No discharge.        Left eye: No discharge.  Cardiovascular:     Rate and Rhythm: Normal rate and regular rhythm.     Heart sounds: Normal heart sounds.  Pulmonary:     Effort: Pulmonary effort is normal.   Musculoskeletal:     Cervical back: No rigidity.  Skin:    General: Skin is warm and dry.     Findings: No erythema.  Neurological:     Mental Status:  She is alert.  Psychiatric:        Mood and Affect: Mood is not anxious.     ED Results / Procedures / Treatments   Labs (all labs ordered are listed, but only abnormal results are displayed) Labs Reviewed - No data to display  EKG None  Radiology No results found.  Procedures Procedures (including critical care time)  Medications Ordered in ED Medications  amoxicillin-clavulanate (AUGMENTIN) 875-125 MG per tablet 1 tablet (has no administration in time range)    ED Course  I have reviewed the triage vital signs and the nursing notes.  Pertinent labs & imaging results that were available during my care of the patient were reviewed by me and considered in my medical decision making (see chart for details).    MDM Rules/Calculators/A&P                          Patient presents with acute facial swelling/pain.  No obvious ear pathology on exam though she is having some ear discomfort.  Possibly oral infection causing some facial cellulitis/swelling.  I do not think acute imaging is needed at this point and will start on some antibiotics and have her follow-up with her PCP.  She is otherwise well-appearing. Final Clinical Impression(s) / ED Diagnoses Final diagnoses:  Facial cellulitis    Rx / DC Orders ED Discharge Orders    None       Sherwood Gambler, MD 11/23/19 2139

## 2020-01-09 ENCOUNTER — Other Ambulatory Visit: Payer: Self-pay

## 2020-01-09 ENCOUNTER — Encounter (HOSPITAL_BASED_OUTPATIENT_CLINIC_OR_DEPARTMENT_OTHER): Payer: Self-pay | Admitting: Emergency Medicine

## 2020-01-09 ENCOUNTER — Emergency Department (HOSPITAL_BASED_OUTPATIENT_CLINIC_OR_DEPARTMENT_OTHER)
Admission: EM | Admit: 2020-01-09 | Discharge: 2020-01-09 | Disposition: A | Discharge status: Home / Self Care | Payer: Medicaid Other | Attending: Emergency Medicine | Admitting: Emergency Medicine

## 2020-01-09 DIAGNOSIS — R0602 Shortness of breath: Secondary | ICD-10-CM | POA: Insufficient documentation

## 2020-01-09 DIAGNOSIS — Z87891 Personal history of nicotine dependence: Secondary | ICD-10-CM | POA: Insufficient documentation

## 2020-01-09 DIAGNOSIS — M79602 Pain in left arm: Secondary | ICD-10-CM | POA: Diagnosis not present

## 2020-01-09 DIAGNOSIS — J45909 Unspecified asthma, uncomplicated: Secondary | ICD-10-CM | POA: Insufficient documentation

## 2020-01-09 DIAGNOSIS — Z79899 Other long term (current) drug therapy: Secondary | ICD-10-CM | POA: Diagnosis not present

## 2020-01-09 DIAGNOSIS — R61 Generalized hyperhidrosis: Secondary | ICD-10-CM | POA: Insufficient documentation

## 2020-01-09 DIAGNOSIS — Z7951 Long term (current) use of inhaled steroids: Secondary | ICD-10-CM | POA: Diagnosis not present

## 2020-01-09 DIAGNOSIS — I10 Essential (primary) hypertension: Secondary | ICD-10-CM | POA: Insufficient documentation

## 2020-01-09 DIAGNOSIS — R079 Chest pain, unspecified: Secondary | ICD-10-CM | POA: Diagnosis present

## 2020-01-09 DIAGNOSIS — M549 Dorsalgia, unspecified: Secondary | ICD-10-CM | POA: Diagnosis not present

## 2020-01-09 LAB — BASIC METABOLIC PANEL
Anion gap: 10 (ref 5–15)
BUN: 14 mg/dL (ref 6–20)
CO2: 27 mmol/L (ref 22–32)
Calcium: 8.6 mg/dL — ABNORMAL LOW (ref 8.9–10.3)
Chloride: 100 mmol/L (ref 98–111)
Creatinine, Ser: 1.05 mg/dL — ABNORMAL HIGH (ref 0.44–1.00)
GFR calc Af Amer: >60 mL/min (ref >60)
GFR calc non Af Amer: >60 mL/min (ref >60)
Glucose, Bld: 101 mg/dL — ABNORMAL HIGH (ref 70–99)
Potassium: 3.6 mmol/L (ref 3.5–5.1)
Sodium: 137 mmol/L (ref 135–145)

## 2020-01-09 LAB — CBC WITH DIFFERENTIAL/PLATELET
Abs Immature Granulocytes: 0.04 10*3/uL (ref 0.00–0.07)
Basophils Absolute: 0 10*3/uL (ref 0.0–0.1)
Basophils Relative: 0 %
Eosinophils Absolute: 0.2 10*3/uL (ref 0.0–0.5)
Eosinophils Relative: 2 %
HCT: 33.3 % — ABNORMAL LOW (ref 36.0–46.0)
Hemoglobin: 10.5 g/dL — ABNORMAL LOW (ref 12.0–15.0)
Immature Granulocytes: 0 %
Lymphocytes Relative: 31 %
Lymphs Abs: 3.2 10*3/uL (ref 0.7–4.0)
MCH: 28.8 pg (ref 26.0–34.0)
MCHC: 31.5 g/dL (ref 30.0–36.0)
MCV: 91.5 fL (ref 80.0–100.0)
Monocytes Absolute: 0.7 10*3/uL (ref 0.1–1.0)
Monocytes Relative: 7 %
Neutro Abs: 6.1 10*3/uL (ref 1.7–7.7)
Neutrophils Relative %: 60 %
Platelets: 347 10*3/uL (ref 150–400)
RBC: 3.64 MIL/uL — ABNORMAL LOW (ref 3.87–5.11)
RDW: 14.1 % (ref 11.5–15.5)
WBC: 10.4 10*3/uL (ref 4.0–10.5)
nRBC: 0 % (ref 0.0–0.2)

## 2020-01-09 LAB — BRAIN NATRIURETIC PEPTIDE: B Natriuretic Peptide: 25.4 pg/mL (ref 0.0–100.0)

## 2020-01-09 LAB — TROPONIN I (HIGH SENSITIVITY): Troponin I (High Sensitivity): 3 ng/L (ref <18)

## 2020-01-09 MED ORDER — NITROGLYCERIN 0.4 MG SL SUBL
0.4000 mg | SUBLINGUAL_TABLET | SUBLINGUAL | Status: DC | PRN
  Administered 2020-01-09: 0.4 mg via SUBLINGUAL
  Filled 2020-01-09: qty 1

## 2020-01-09 MED ORDER — MORPHINE SULFATE (PF) 4 MG/ML IV SOLN
4.0000 mg | Freq: Once | INTRAVENOUS | Status: AC
  Administered 2020-01-09: 4 mg via INTRAVENOUS
  Filled 2020-01-09: qty 1

## 2020-01-09 NOTE — ED Provider Notes (Signed)
[place] EMERGENCY DEPARTMENT Provider Note   CSN: [id number] Arrival date & time: 01/09/20  5277     History Chief Complaint  Patient presents with  . Chest Pain    Felicia Molina is a 49 y.o. female.  She has a history of hypertension chronic back pain fibromyalgia.  Started with left-sided sharp stabbing chest pain radiating to left arm yesterday while driving.  Associated with some shortness of breath and sweating.  She was seen at [facility] emergency department, normal chest x-ray, normal troponin, normal D-dimer.  Due to her risk factors they asked her to stay overnight for further testing.  Unfortunately she had her child with her and he could not stay in the hospital and so she signed out AMA.  Returns today with continued pain that kept her up through the night.  She has had chest pain before.  Followed with a cardiologist in the past and she said she had a stress test.  Has not seen cardiology in a few years.  The history is provided by the patient.  Chest Pain Pain location:  L chest Pain quality: stabbing   Pain radiates to:  L arm Pain severity:  Severe Onset quality:  Sudden Duration:  20 hours Timing:  Constant Progression:  Unchanged Chronicity:  Recurrent Context: at rest   Relieved by:  Nothing Worsened by:  Nothing Ineffective treatments: percocet. Associated symptoms: back pain (chronic), diaphoresis and shortness of breath   Associated symptoms: no abdominal pain, no cough, no fever, no headache, no lower extremity edema, no nausea and no vomiting   Risk factors: hypertension and obesity     HPI: A 49 year old patient with a history of hypertension, hypercholesterolemia and obesity presents for evaluation of chest pain. Initial onset of pain was more than 6 hours ago. The patient's chest pain is sharp and is not worse with exertion. The patient reports some diaphoresis. The patient's chest pain is middle- or left-sided, is not well-localized, is not  described as heaviness/pressure/tightness and does radiate to the arms/jaw/neck. The patient does not complain of nausea. The patient has a family history of coronary artery disease in a first-degree relative with onset less than age 36. The patient has no history of stroke, has no history of peripheral artery disease, has not smoked in the past 90 days and denies any history of treated diabetes.   Past Medical History:  Diagnosis Date  . Asthma   . Atypical chest pain 07/28/2018  . Back pain, chronic   . Fibromyalgia   . Hypertension   . Morbid obesity ([place])   . Neurogenic bladder   . Pain management contract signed   . Palpitations 07/28/2018  . Panic attacks   . Reflux     Patient Active Problem List   Diagnosis Date Noted  . Atypical chest pain 07/28/2018  . Palpitations 07/28/2018  . Right wrist pain 11/08/2017  . Asthma 10/14/2017  . Depression 10/14/2017  . GERD (gastroesophageal reflux disease) 10/14/2017  . Fibroid 09/09/2017  . Morbid obesity with BMI of 50.0-59.9, adult ([place]) 08/05/2016  . Rheumatoid arthritis ([place]) 08/05/2016  . Chronic bilateral low back pain without sciatica 05/13/2015  . Plantar fasciitis, bilateral 07/19/2012  . Metatarsalgia of both feet 07/19/2012  . Acute respiratory failure ([place]) 05/07/2012  . Hypertension   . CAP (community acquired pneumonia) 05/06/2012    Past Surgical History:  Procedure Laterality Date  . BACK SURGERY    . bladder stimulator    . CARPAL TUNNEL  RELEASE    . TONSILLECTOMY    . URETERAL STENT PLACEMENT  2008     OB History    Gravida  6   Para  4   Term  4   Preterm  0   AB  2   Living  4     SAB  2   TAB  0   Ectopic  0   Multiple  0   Live Births  4           Family History  Problem Relation Age of Onset  . Heart attack Mother   . Heart disease Father   . Heart attack Father   . Heart disease Sister     Social History   Tobacco Use  . Smoking status: Former Research scientist (life sciences)  . Smokeless  tobacco: Never Used  Vaping Use  . Vaping Use: Never used  Substance Use Topics  . Alcohol use: No  . Drug use: No    Home Medications Prior to Admission medications   Medication Sig Start Date End Date Taking? Authorizing Provider  albuterol (VENTOLIN HFA) 108 (90 Base) MCG/ACT inhaler Inhale 2 puffs into the lungs every 4 (four) hours as needed for wheezing or shortness of breath. 04/07/19   Noemi Chapel, MD  amoxicillin-clavulanate (AUGMENTIN) 875-125 MG tablet Take 1 tablet by mouth 2 (two) times daily. One po bid x 7 days 11/23/19   Sherwood Gambler, MD  atenolol-chlorthalidone (TENORETIC) 100-25 MG tablet Take by mouth. 10/14/17   [provider]  budesonide-formoterol (SYMBICORT) 160-4.5 MCG/ACT inhaler Inhale 2 puffs into the lungs 2 (two) times daily.    [provider]  buPROPion (WELLBUTRIN SR) 150 MG 12 hr tablet Take by mouth. 10/14/17   [provider]  cetirizine (ZYRTEC) 10 MG tablet TK 1 T PO DAILY 10/14/17   [provider]  cyclobenzaprine (FLEXERIL) 10 MG tablet Take 10 mg by mouth 3 (three) times daily as needed. Takes for muscle spasms    [provider]  diclofenac sodium (VOLTAREN) 1 % GEL Apply 2 g topically 4 (four) times daily.    [provider]  fluticasone (FLONASE) 50 MCG/ACT nasal spray 2 SPRAYS INTO EACH NOSTRIL DAILY 10/14/17   [provider]  furosemide (LASIX) 40 MG tablet Take 1 tablet (40 mg total) by mouth daily. 05/09/12   Verlee Monte, MD  hydrALAZINE (APRESOLINE) 25 MG tablet Take 100 mg by mouth daily.    [provider]  ipratropium (ATROVENT) 0.06 % nasal spray Place 2 sprays into the nose 4 (four) times daily. 03/04/13   Billy Fischer, MD  losartan (COZAAR) 100 MG tablet TK 1 T PO DAILY 10/14/17   [provider]  montelukast (SINGULAIR) 10 MG tablet Take 10 mg by mouth at bedtime.    [provider]  norethindrone (MICRONOR,CAMILA,ERRIN) 0.35 MG tablet  09/09/17    [provider]  OMEPRAZOLE PO Take 40 mg by mouth daily.     [provider]  oxymorphone (OPANA ER) 20 MG 12 hr tablet Take 20 mg by mouth every 12 (twelve) hours.    [provider]  potassium chloride SA (K-DUR,KLOR-CON) 20 MEQ tablet Take 1 tablet (20 mEq total) by mouth daily. 09/03/17   Drenda Freeze, MD  pregabalin (LYRICA) 75 MG capsule Take 75 mg by mouth 3 (three) times daily.    [provider]  rosuvastatin (CRESTOR) 20 MG tablet TK 1 T PO  Q NIGHT 10/14/17  [provider]  potassium chloride (K-DUR) 10 MEQ tablet Take 1 tablet (10 mEq total) by mouth daily. 10/11/18 01/06/19  Petrucelli, Samantha R, PA-C    Allergies    Clindamycin/lincomycin, Ibuprofen, Tomato, and Vicodin [hydrocodone-acetaminophen]  Review of Systems   Review of Systems  Constitutional: Positive for diaphoresis. Negative for fever.  HENT: Negative for sore throat.   Eyes: Negative for visual disturbance.  Respiratory: Positive for shortness of breath. Negative for cough.   Cardiovascular: Positive for chest pain.  Gastrointestinal: Negative for abdominal pain, nausea and vomiting.  Genitourinary: Negative for dysuria.  Musculoskeletal: Positive for back pain (chronic).  Skin: Negative for rash.  Neurological: Negative for headaches.    Physical Exam Updated Vital Signs BP 113/74   Pulse 84   Temp 98.3 F (36.8 C) (Oral)   Resp 20   Ht 5' (1.524 m)   Wt 127.9 kg   LMP 11/02/2019   SpO2 99%   BMI 55.07 kg/m   Physical Exam Vitals and nursing note reviewed.  Constitutional:      General: She is not in acute distress.    Appearance: She is well-developed. She is obese.  HENT:     Head: Normocephalic and atraumatic.  Eyes:     Conjunctiva/sclera: Conjunctivae normal.  Cardiovascular:     Rate and Rhythm: Normal rate and regular rhythm.     Heart sounds: Normal heart sounds. No murmur heard.   Pulmonary:     Effort: Pulmonary effort is  normal. No respiratory distress.     Breath sounds: Normal breath sounds.  Abdominal:     Palpations: Abdomen is soft.     Tenderness: There is no abdominal tenderness.  Musculoskeletal:     Cervical back: Neck supple.     Right lower leg: No tenderness. No edema.     Left lower leg: No tenderness. No edema.  Skin:    General: Skin is warm and dry.     Capillary Refill: Capillary refill takes less than 2 seconds.  Neurological:     General: No focal deficit present.     Mental Status: She is alert.     ED Results / Procedures / Treatments   Labs (all labs ordered are listed, but only abnormal results are displayed) Labs Reviewed  BASIC METABOLIC PANEL - Abnormal; Notable for the following components:      Result Value   Glucose, Bld 101 (*)    Creatinine, Ser 1.05 (*)    Calcium 8.6 (*)    All other components within normal limits  CBC WITH DIFFERENTIAL/PLATELET - Abnormal; Notable for the following components:   RBC 3.64 (*)    Hemoglobin 10.5 (*)    HCT 33.3 (*)    All other components within normal limits  BRAIN NATRIURETIC PEPTIDE  TROPONIN I (HIGH SENSITIVITY)    EKG EKG Interpretation  Date/Time:  Wednesday January 09 2020 07:33:21 EDT Ventricular Rate:  87 PR Interval:    QRS Duration: 95 QT Interval:  418 QTC Calculation: 503 R Axis:   48 Text Interpretation: Sinus rhythm Low voltage, precordial leads Borderline prolonged QT interval No significant change since prior 3/21 Confirmed by Aletta Edouard [phone]) on 01/09/2020 7:36:10 AM   Radiology No results found.  Procedures Procedures (including critical care time)  Medications Ordered in ED Medications  morphine 4 MG/ML injection 4 mg (4 mg Intravenous Given 01/09/20 0853)    ED Course  I have reviewed the triage vital signs and the nursing notes.  Pertinent labs & imaging results that were available during my care of the patient were reviewed by me and considered in my medical decision making  (see chart for details).  Clinical Course as of Jan 08 1899  Wed Jan 09, 2020  0842 Reevaluated patient-she said she had minimal change with nitroglycerin.   [MB]  [phone] Did not feel patient needed repeat chest x-ray.  Had a chest x-ray yesterday at [facility] and I was able to read the interpretation.  It was negative.   [MB]  [place] stress test 12/24/2016: 1. This was a 2-day protocol. The resting electrocardiogram demonstrated normal sinus rhythm, normal resting conduction and no resting arrhythmias. Low voltage. Stress EKG is non-diagnostic for ischemia as it a pharmacologic stress using Lexiscan. Stress symptoms included dyspnea, nausea and dizziness.  2. SPECT images demonstrate small perfusion abnormality of mild intensity in the apical myocardial wall(s) on the stress images. These defects are related to apical thinning. Gated SPECT images reveal normal myocardial thickening and wall motion. The left ventricular ejection fraction was calculated or visually estimated to be 53%. This is a low risk study.   [MB]  6073 Patient's work-up here has been unremarkable.  She had troponins done yesterday so I do not think she needs another troponin today other than the one we already drew.  Reviewed her results with her.  She saw Dr. Virgina Jock a few years ago.  I recommended that she follow-up with cardiology again.  She said she would rather follow-up with a cardiologist here so we will put in a referral for that.  Return instructions discussed.   [MB]    Clinical Course User Index [MB] Hayden Rasmussen, MD   MDM Rules/Calculators/A&P HEAR Score: 4                       This patient complains of left-sided chest pain diaphoresis shortness of breath radiation to left arm; this involves an extensive number of treatment Options and is a complaint that carries with it a high risk of complications and Morbidity. The differential includes ACS, pneumonia, PE, musculoskeletal, reflux,  vascular  I ordered, reviewed and interpreted labs, which included CBC with normal white count, stable hemoglobin, chemistries with mildly elevated creatinine, troponin negative, BNP not elevated reviewed also patient's prior lab work from [facility] yesterday which showed a negative D-dimer I ordered medication sublingual nitroglycerin without any change in her pain, IV morphine  Previous records obtained and reviewed in epic including emergency department visit yesterday to [facility] and prior cardiology work-up few years ago by Dr. Virgina Jock  After the interventions stated above, I reevaluated the patient and found patient still to be having some pain in her arm but no evidence of ischemia.  Negative D-dimer yesterday normal chest x-ray.  No evidence of heart failure.  Pain seems muscular in nature as it is reproducible.  Unable to take aspirin or anti-inflammatories due to allergies.  Recommended follow-up with PCP.  Also have placed a referral into cardiology.  Return instructions discussed.   Final Clinical Impression(s) / ED Diagnoses Final diagnoses:  Nonspecific chest pain    Rx / DC Orders ED Discharge Orders         Ordered    Ambulatory referral to Cardiology        01/09/20 0958           Hayden Rasmussen, MD 01/09/20 [phone]

## 2020-01-09 NOTE — ED Notes (Signed)
Pt on monitor 

## 2020-01-09 NOTE — Discharge Instructions (Signed)
You were seen in the emergency department for continued left-sided chest pain.  I reviewed the work-up that was done at [facility] yesterday and your testing today.  There is no evidence of blood clot or heart injury.  Please follow-up with your primary care doctor and we are also putting a referral in for cardiology.  Return to the emergency department for any worsening or concerning symptoms.

## 2020-01-09 NOTE — ED Triage Notes (Signed)
Was seen yesterday at [facility] for similar issues. reports persistent left chest pain radiating to arm . Shortness of breath. Hx HTN  Has pending admission at [facility], unable to stay due to child care.

## 2020-01-11 NOTE — Progress Notes (Signed)
Cardiology Office Note:    Date:  01/17/2020   ID:  Felicia Molina, DOB 09-08-1970, MRN [id number]  PCP:  Ivor Costa  [place] Cardiologist:  No primary care provider on file.  [facility] Electrophysiologist:  None   Referring MD: Hayden Rasmussen, MD   History of Present Illness:    Felicia Molina is a 49 y.o. female with a hx of HTN, HLD, fibromyalgia and obesity who was referred by Dr. Melina Copa for evaluation of chest pain.  Of note, the patient was recently seen in the [place] ER on 09/14 for episode of sharp, left-sided chest pain.  ECG without ischemic changes. Trop-I <0.01, CK-MB 2, D-dimer 285. Her pain subsided without intervention, however, given her risk factors, she was recommended for stress testing.Unfortantely, she had to leave prior to testing as she had her daughter with her and did not have childcare available. She re-presented to [facility] ER on 09/15 with recurrence of her left sided and central chest pain. ECG here demonstrated sinus rhythm with low voltage in the precordial leads, prolonged Qtc but no evidence of ischemia.  Labs notable for trop 3, BNP 25.4. She was given nitro for her chest pain, but it did not help. Per ED notes, pain was reproducible with palpation with low suspicion of ACS. Given negative work-up and reassuring examination in [facility] ER, the patient was discharged home with plans to follow-up in Cardiology clinic today.  Currently she is still having chest pain radiating from the left breast to the middle of the chest. Also notes woresening shortness of breath with exertion that is different than normal for her. Has some significant diaphoresis with the shortness of breath. Has to use inhaler and rest in order to feel better. The shortness of breath has been getting worse over the past several months.  Family history notable mother has coronary artery disease, father died of MI (unknown age), multiple maternal aunts with coronary disease.   BP  well controlled today with 110/60.  Past Medical History:  Diagnosis Date  . Asthma   . Atypical chest pain 07/28/2018  . Back pain, chronic   . Fibromyalgia   . Hypertension   . Morbid obesity ([place])   . Neurogenic bladder   . Pain management contract signed   . Palpitations 07/28/2018  . Panic attacks   . Reflux     Past Surgical History:  Procedure Laterality Date  . BACK SURGERY    . bladder stimulator    . CARPAL TUNNEL RELEASE    . TONSILLECTOMY    . URETERAL STENT PLACEMENT  2008    Current Medications: Current Meds  Medication Sig  . albuterol (VENTOLIN HFA) 108 (90 Base) MCG/ACT inhaler Inhale 2 puffs into the lungs every 4 (four) hours as needed for wheezing or shortness of breath.  Marland Kitchen atenolol-chlorthalidone (TENORETIC) 100-25 MG tablet Take 1 tablet by mouth daily.   . budesonide-formoterol (SYMBICORT) 160-4.5 MCG/ACT inhaler Inhale 2 puffs into the lungs 2 (two) times daily.  Marland Kitchen buPROPion (WELLBUTRIN SR) 150 MG 12 hr tablet Take 150 mg by mouth 2 (two) times daily.   . cetirizine (ZYRTEC) 10 MG tablet Take 10 mg by mouth daily.   . cyclobenzaprine (FLEXERIL) 10 MG tablet Take 10 mg by mouth 3 (three) times daily as needed. Takes for muscle spasms  . diclofenac sodium (VOLTAREN) 1 % GEL Apply 2 g topically 4 (four) times daily.  . fluticasone (FLONASE) 50 MCG/ACT nasal spray 2 SPRAYS INTO [facility]  NOSTRIL DAILY  . furosemide (LASIX) 40 MG tablet Take 1 tablet (40 mg total) by mouth daily. (Patient taking differently: Take 20 mg by mouth daily. )  . hydrALAZINE (APRESOLINE) 100 MG tablet Take 100 mg by mouth 2 (two) times daily.  Marland Kitchen ipratropium (ATROVENT) 0.06 % nasal spray Place 2 sprays into the nose 4 (four) times daily.  Marland Kitchen losartan (COZAAR) 100 MG tablet TK 1 T PO DAILY  . montelukast (SINGULAIR) 10 MG tablet Take 10 mg by mouth at bedtime.  Marland Kitchen omeprazole (PRILOSEC) 40 MG capsule Take 40 mg by mouth daily.  . potassium chloride SA (K-DUR,KLOR-CON) 20 MEQ tablet Take 1  tablet (20 mEq total) by mouth daily.  . pregabalin (LYRICA) 75 MG capsule Take 75 mg by mouth 2 (two) times daily.   . rosuvastatin (CRESTOR) 20 MG tablet Take 20 mg by mouth daily.      Allergies:   Clindamycin/lincomycin, Ibuprofen, Tomato, and Vicodin [hydrocodone-acetaminophen]   Social History   Socioeconomic History  . Marital status: Single    Spouse name: Not on file  . Number of children: 4  . Years of education: Not on file  . Highest education level: Not on file  Occupational History  . Not on file  Tobacco Use  . Smoking status: Former Games developer  . Smokeless tobacco: Never Used  Vaping Use  . Vaping Use: Never used  Substance and Sexual Activity  . Alcohol use: No  . Drug use: No  . Sexual activity: Not on file  Other Topics Concern  . Not on file  Social History Narrative  . Not on file   Social Determinants of Health   Financial Resource Strain:   . Difficulty of Paying Living Expenses: Not on file  Food Insecurity:   . Worried About Programme researcher, broadcasting/film/video in the Last Year: Not on file  . Ran Out of Food in the Last Year: Not on file  Transportation Needs:   . Lack of Transportation (Medical): Not on file  . Lack of Transportation (Non-Medical): Not on file  Physical Activity:   . Days of Exercise per Week: Not on file  . Minutes of Exercise per Session: Not on file  Stress:   . Feeling of Stress : Not on file  Social Connections:   . Frequency of Communication with Friends and Family: Not on file  . Frequency of Social Gatherings with Friends and Family: Not on file  . Attends Religious Services: Not on file  . Active Member of Clubs or Organizations: Not on file  . Attends Banker Meetings: Not on file  . Marital Status: Not on file     Family History: The patient's *family history includes Heart attack in her father and mother; Heart disease in her father and sister.  ROS:   Please see the history of present illness.    Reports some  migraine headaches and body aches. No fevers, chills, nausea or vomiting. No lightheadedness or dizziness. No syncope.   EKGs/Labs/Other Studies Reviewed:    The following studies were reviewed today: Labs 09/15 from [facility] ER Trop 3 BNP 25.4 HgB 10.5 Cr 1.05 (baseline 1.2)  Labs from [facility] ER 09/14: Trop <0.01 CK-MB 2 D-dimer 285  ECG [facility] 09/15: NSR, low voltage in precordial leads, prolonged Qt  12/24/2016: Lexiscan myoview stress test 12/24/2016: 1. This was a 2-day protocol. The resting electrocardiogram demonstrated normal sinus rhythm, normal resting conduction and no resting arrhythmias. Low voltage. Stress EKG is  non-diagnostic for ischemia as it a pharmacologic stress using Lexiscan. Stress symptoms included dyspnea, nausea and dizziness.  2. SPECT images demonstrate small perfusion abnormality of mild intensity in the apical myocardial wall(s) on the stress images. These defects are related to apical thinning. Gated SPECT images reveal normal myocardial thickening and wall motion. The left ventricular ejection fraction was calculated or visually estimated to be 53%. This is a low risk study.   EKG:  EKG is 01/08/20: NSR with low voltage. No ischemic changes. No block.  Recent Labs: 04/15/2019: ALT 26 01/09/2020: B Natriuretic Peptide 25.4; BUN 14; Creatinine, Ser 1.05; Hemoglobin 10.5; Platelets 347; Potassium 3.6; Sodium 137  Recent Lipid Panel No results found for: CHOL, TRIG, HDL, CHOLHDL, VLDL, LDLCALC, LDLDIRECT  Physical Exam:    VS:  BP 110/60   Pulse 97   Ht 5' (1.524 m)   Wt 285 lb (129.3 kg)   SpO2 97%   BMI 55.66 kg/m     Wt Readings from Last 3 Encounters:  01/17/20 285 lb (129.3 kg)  01/09/20 282 lb (127.9 kg)  11/23/19 (!) 286 lb (129.7 kg)     GEN: Obese female, comfortable HEENT: Normal NECK: No JVD; No carotid bruits CARDIAC: RRR, no murmurs, rubs, gallops RESPIRATORY:  Clear to auscultation without rales, wheezing or rhonchi  ABDOMEN:  Obese, soft, non-tender, non-distended MUSCULOSKELETAL:  Trace pedal edema; No deformity  SKIN: Warm and dry NEUROLOGIC:  Alert and oriented x 3 PSYCHIATRIC:  Normal affect   ASSESSMENT:    1. Dyspnea, unspecified type   2. Stable angina pectoris ([place])   3. Essential hypertension   4. Morbid obesity with BMI of 50.0-59.9, adult ([place])   5. Pure hypercholesterolemia    PLAN:    In order of problems listed above:  1. Dyspnea on Exertion: Worrisome symptoms of worsening dyspnea on exertion that has progressively worsened over the past several months. Has significant diaphoresis during these events. Work-up in ERs detailed above without active ischemia, however, last stress test 2018. No echo in system. -Will obtain coronary CTA. Unable to exercise due to SOB with exertion -Patient has asthma, however, tolerates atenolol at home so should be able to tolerate metoprolol during CTA -If not, can change to nuclear stress -Check TTE -Will repeat BMP today to assess renal function -Follow-up with Pulm as scheduled  2. Chest Pain: Atypical. Has some MSK component as painful when I press the left breast area, however, exertional DOE worrisome for anginal equivalent.  -Coronary CTA as above -Check TTE  3.  Hypertension: Well controlled today. Managed by PCP. -Cotinue atenolol-chlorthalidone -Continue hydralazine 100mg  BID -Continue losartan 100mg  daily  4. Hyperlipidemia -On crestor 20mg  daily -No recent labs in our system; will repeat lipid panel when returns for echo   5. Morbid Obesity: BMI 56.  -Extensive counseling regarding the importance of weight loss as likely contributing to the pain the joint pain and MSK pain she is having. She is working on diet. Exercise is limited due to SOB but she is trying to be more active. Will continue to encourage lifestyle changes. -Will check hemoglobin A1C during labs   Medication Adjustments/Labs and Tests Ordered: Current medicines are  reviewed at length with the patient today.  Concerns regarding medicines are outlined above.  Orders Placed This Encounter  Procedures  . CT CORONARY MORPH W/CTA COR W/SCORE W/CA W/CM &/OR WO/CM  . CT CORONARY FRACTIONAL FLOW RESERVE DATA PREP  . CT CORONARY FRACTIONAL FLOW RESERVE FLUID ANALYSIS  .  Lipid panel  . HgB A1c  . Basic metabolic panel  . ECHOCARDIOGRAM COMPLETE   Meds ordered this encounter  Medications  . metoprolol tartrate (LOPRESSOR) 100 MG tablet    Sig: Take 1 tablet (100 mg total) by mouth once for 1 dose. Take $RemoveBe'100mg'RROSwytRp$  tablet 2 hours prior to your cardiac CT scan.    Dispense:  1 tablet    Refill:  0    Patient Instructions  Medication Instructions:  Your physician recommends that you continue on your current medications as directed. Please refer to the Current Medication list given to you today.  Labwork:  You will have labs drawn on Sept 27- BMP, HgbA1c, Fasting Lipid Panel. Please do not have anything to eat before arriving. You may have unsweetened tea, black coffee and water.    Testing/Procedures: Your physician has requested that you have cardiac CT. Cardiac computed tomography (CT) is a painless test that uses an x-ray machine to take clear, detailed pictures of your heart. For further information please visit HugeFiesta.tn. Please follow instruction sheet as given.  Your physician has requested that you have an echocardiogram. Echocardiography is a painless test that uses sound waves to create images of your heart. It provides your doctor with information about the size and shape of your heart and how well your heart's chambers and valves are working. This procedure takes approximately one hour. There are no restrictions for this procedure.    Follow-Up: Your physician recommends that you schedule a follow-up appointment in:   Sept 27 at 9:30am. Please arrive at 9 am have your lab work done. Sept 27 for lab work- Fasting lipid panel, Basic  Metabolic Panel, and Hemoglobin A1C  October 25 at 9:40am with Dr. Johney Frame   Any Other Special Instructions Will Be Listed Below (If Applicable).  Your cardiac CT will be scheduled at one of the below locations:   [facility] [address] [place], [place] [address] [phone]  [place] [address] [place], [place] [address] [phone]  If scheduled at [facility], please arrive at the [facility] main entrance of [facility] 30 minutes prior to test start time. Proceed to the [facility] Radiology Department (first floor) to check-in and test prep.  If scheduled at [facility], please arrive 15 mins early for check-in and test prep.  Please follow these instructions carefully (unless otherwise directed):  On the Night Before the Test: . Be sure to Drink plenty of water. . Do not consume any caffeinated/decaffeinated beverages or chocolate 12 hours prior to your test. . Do not take any antihistamines 12 hours prior to your test.  On the Day of the Test: . Drink plenty of water. Do not drink any water within one hour of the test. . Do not eat any food 4 hours prior to the test. . You may take your regular medications prior to the test except your lasix, atenolol/chlorthalidone, and potassium supplement  . Take metoprolol (Lopressor) two hours prior to test, $Remove'100mg'lZjyZsp$  tablet.  Marland Kitchen FEMALES- please wear underwire-free bra if available     After the Test: . Drink plenty of water. . After receiving IV contrast, you may experience a mild flushed feeling. This is normal. . On occasion, you may experience a mild rash up to 24 hours after the test. This is not dangerous. If this occurs, you can take Benadryl 25 mg and increase your fluid intake. Marland Kitchen  If you experience trouble breathing, this can be serious. If it is severe call 911 IMMEDIATELY. If it is mild, please call our  office.   Once we have confirmed authorization from your insurance company, we will call you to set up a date and time for your test. Based on how quickly your insurance processes prior authorizations requests, please allow up to 4 weeks to be contacted for scheduling your Cardiac CT appointment. Be advised that routine Cardiac CT appointments could be scheduled as many as 8 weeks after your provider has ordered it.  For non-scheduling related questions, please contact the cardiac imaging nurse navigator should you have any questions/concerns: Marchia Bond, Cardiac Imaging Nurse Navigator Burley Saver, Interim Cardiac Imaging Nurse [place] and Vascular Services Direct Office Dial: [phone]   For scheduling needs, including cancellations and rescheduling, please call Vivien Rota at [phone], option 3.     If you need a refill on your cardiac medications before your next appointment, please call your pharmacy.      Signed, Freada Bergeron, MD  01/17/2020 12:01 PM    [place]

## 2020-01-17 ENCOUNTER — Other Ambulatory Visit: Payer: Self-pay | Admitting: Physician Assistant

## 2020-01-17 ENCOUNTER — Ambulatory Visit (INDEPENDENT_AMBULATORY_CARE_PROVIDER_SITE_OTHER): Payer: Medicaid Other | Admitting: Cardiology

## 2020-01-17 ENCOUNTER — Encounter: Payer: Self-pay | Admitting: Cardiology

## 2020-01-17 ENCOUNTER — Other Ambulatory Visit: Payer: Self-pay

## 2020-01-17 VITALS — BP 110/60 | HR 97 | Ht 60.0 in | Wt 285.0 lb

## 2020-01-17 DIAGNOSIS — R0600 Dyspnea, unspecified: Secondary | ICD-10-CM | POA: Diagnosis not present

## 2020-01-17 DIAGNOSIS — I208 Other forms of angina pectoris: Secondary | ICD-10-CM | POA: Diagnosis not present

## 2020-01-17 DIAGNOSIS — E7800 Pure hypercholesterolemia, unspecified: Secondary | ICD-10-CM

## 2020-01-17 DIAGNOSIS — I10 Essential (primary) hypertension: Secondary | ICD-10-CM | POA: Diagnosis not present

## 2020-01-17 DIAGNOSIS — Z6841 Body Mass Index (BMI) 40.0 and over, adult: Secondary | ICD-10-CM

## 2020-01-17 MED ORDER — METOPROLOL TARTRATE 100 MG PO TABS: 100.0000 mg | ORAL_TABLET | Freq: Once | ORAL | 0 refills | Status: DC

## 2020-01-17 NOTE — Patient Instructions (Addendum)
Medication Instructions:  Your physician recommends that you continue on your current medications as directed. Please refer to the Current Medication list given to you today.  Labwork:  You will have labs drawn on Sept 27- BMP, HgbA1c, Fasting Lipid Panel. Please do not have anything to eat before arriving. You may have unsweetened tea, black coffee and water.    Testing/Procedures: Your physician has requested that you have cardiac CT. Cardiac computed tomography (CT) is a painless test that uses an x-ray machine to take clear, detailed pictures of your heart. For further information please visit HugeFiesta.tn. Please follow instruction sheet as given.  Your physician has requested that you have an echocardiogram. Echocardiography is a painless test that uses sound waves to create images of your heart. It provides your doctor with information about the size and shape of your heart and how well your heart's chambers and valves are working. This procedure takes approximately one hour. There are no restrictions for this procedure.    Follow-Up: Your physician recommends that you schedule a follow-up appointment in:   Sept 27 at 9:30am. Please arrive at 9 am have your lab work done. Sept 27 for lab work- Fasting lipid panel, Basic Metabolic Panel, and Hemoglobin A1C  October 25 at 9:40am with Dr. Johney Frame   Any Other Special Instructions Will Be Listed Below (If Applicable).  Your cardiac CT will be scheduled at one of the below locations:   [facility] [address] [place], [place] [address] [phone]  [place] [address] [place], [place] [address] [phone]  If scheduled at [facility], please arrive at the [facility] main entrance of [facility] 30 minutes prior to test start time. Proceed to the [facility] Radiology Department (first floor) to check-in and test prep.  If  scheduled at [facility], please arrive 15 mins early for check-in and test prep.  Please follow these instructions carefully (unless otherwise directed):  On the Night Before the Test: . Be sure to Drink plenty of water. . Do not consume any caffeinated/decaffeinated beverages or chocolate 12 hours prior to your test. . Do not take any antihistamines 12 hours prior to your test.  On the Day of the Test: . Drink plenty of water. Do not drink any water within one hour of the test. . Do not eat any food 4 hours prior to the test. . You may take your regular medications prior to the test except your lasix, atenolol/chlorthalidone, and potassium supplement  . Take metoprolol (Lopressor) two hours prior to test, 137m tablet.  .Marland KitchenFEMALES- please wear underwire-free bra if available     After the Test: . Drink plenty of water. . After receiving IV contrast, you may experience a mild flushed feeling. This is normal. . On occasion, you may experience a mild rash up to 24 hours after the test. This is not dangerous. If this occurs, you can take Benadryl 25 mg and increase your fluid intake. . If you experience trouble breathing, this can be serious. If it is severe call 911 IMMEDIATELY. If it is mild, please call our office.   Once we have confirmed authorization from your insurance company, we will call you to set up a date and time for your test. Based on how quickly your insurance processes prior authorizations requests, please allow up to 4 weeks to be contacted for scheduling your Cardiac CT appointment. Be advised that  routine Cardiac CT appointments could be scheduled as many as 8 weeks after your provider has ordered it.  For non-scheduling related questions, please contact the cardiac imaging nurse navigator should you have any questions/concerns: Marchia Bond, Cardiac Imaging Nurse Navigator Burley Saver, Interim Cardiac Imaging Nurse [place] and  Vascular Services Direct Office Dial: [phone]   For scheduling needs, including cancellations and rescheduling, please call Vivien Rota at [phone], option 3.     If you need a refill on your cardiac medications before your next appointment, please call your pharmacy.

## 2020-01-18 ENCOUNTER — Other Ambulatory Visit: Payer: Self-pay

## 2020-01-18 MED ORDER — METOPROLOL TARTRATE 100 MG PO TABS: ORAL_TABLET | ORAL | 0 refills | Status: DC

## 2020-01-21 ENCOUNTER — Other Ambulatory Visit: Payer: Medicaid Other | Admitting: *Deleted

## 2020-01-21 ENCOUNTER — Ambulatory Visit (HOSPITAL_COMMUNITY): Payer: Medicaid Other | Attending: Internal Medicine

## 2020-01-21 ENCOUNTER — Other Ambulatory Visit: Payer: Self-pay

## 2020-01-21 DIAGNOSIS — R0600 Dyspnea, unspecified: Secondary | ICD-10-CM

## 2020-01-21 DIAGNOSIS — I208 Other forms of angina pectoris: Secondary | ICD-10-CM

## 2020-01-21 LAB — ECHOCARDIOGRAM COMPLETE
Area-P 1/2: 3.54 cm2
S' Lateral: 3.9 cm

## 2020-01-22 ENCOUNTER — Other Ambulatory Visit: Payer: Self-pay | Admitting: *Deleted

## 2020-01-22 DIAGNOSIS — E7800 Pure hypercholesterolemia, unspecified: Secondary | ICD-10-CM

## 2020-01-22 LAB — BASIC METABOLIC PANEL
BUN/Creatinine Ratio: 9 (ref 9–23)
BUN: 10 mg/dL (ref 6–24)
CO2: 26 mmol/L (ref 20–29)
Calcium: 9.7 mg/dL (ref 8.7–10.2)
Chloride: 103 mmol/L (ref 96–106)
Creatinine, Ser: 1.15 mg/dL — ABNORMAL HIGH (ref 0.57–1.00)
GFR calc Af Amer: 65 mL/min/{1.73_m2} (ref >59)
GFR calc non Af Amer: 56 mL/min/{1.73_m2} — ABNORMAL LOW (ref >59)
Glucose: 78 mg/dL (ref 65–99)
Potassium: 4.3 mmol/L (ref 3.5–5.2)
Sodium: 143 mmol/L (ref 134–144)

## 2020-01-22 LAB — HEMOGLOBIN A1C
Est. average glucose Bld gHb Est-mCnc: 120 mg/dL
Hgb A1c MFr Bld: 5.8 % — ABNORMAL HIGH (ref 4.8–5.6)

## 2020-01-23 ENCOUNTER — Other Ambulatory Visit: Payer: Medicaid Other

## 2020-01-30 ENCOUNTER — Telehealth: Payer: Self-pay

## 2020-01-30 ENCOUNTER — Telehealth (HOSPITAL_COMMUNITY): Payer: Self-pay | Admitting: *Deleted

## 2020-01-30 NOTE — Telephone Encounter (Signed)
Call placed to patient reference referral to PREP from Dr Johney Frame. Explained program. Interested and can do a daytime class.  Planning Nov classes now. Will call back with date and times of classes. Will schedule intake assessment at that time.  Texted address of [employer] so pt will have that.

## 2020-01-30 NOTE — Telephone Encounter (Signed)

## 2020-01-31 ENCOUNTER — Ambulatory Visit (HOSPITAL_COMMUNITY)
Admission: RE | Admit: 2020-01-31 | Discharge: 2020-01-31 | Disposition: A | Discharge status: Home / Self Care | Payer: Medicaid Other | Source: Ambulatory Visit | Attending: Cardiology | Admitting: Cardiology

## 2020-01-31 ENCOUNTER — Other Ambulatory Visit: Payer: Self-pay

## 2020-01-31 DIAGNOSIS — I208 Other forms of angina pectoris: Secondary | ICD-10-CM | POA: Diagnosis present

## 2020-01-31 IMAGING — DX CHEST - 2 VIEW
2 series · 2 of 2 positions shown · non-contrast
Comparison: 06/11/2018

CLINICAL DATA: Chest pain radiating the left arm.

EXAM:
CHEST - 2 VIEW

[chest pa]
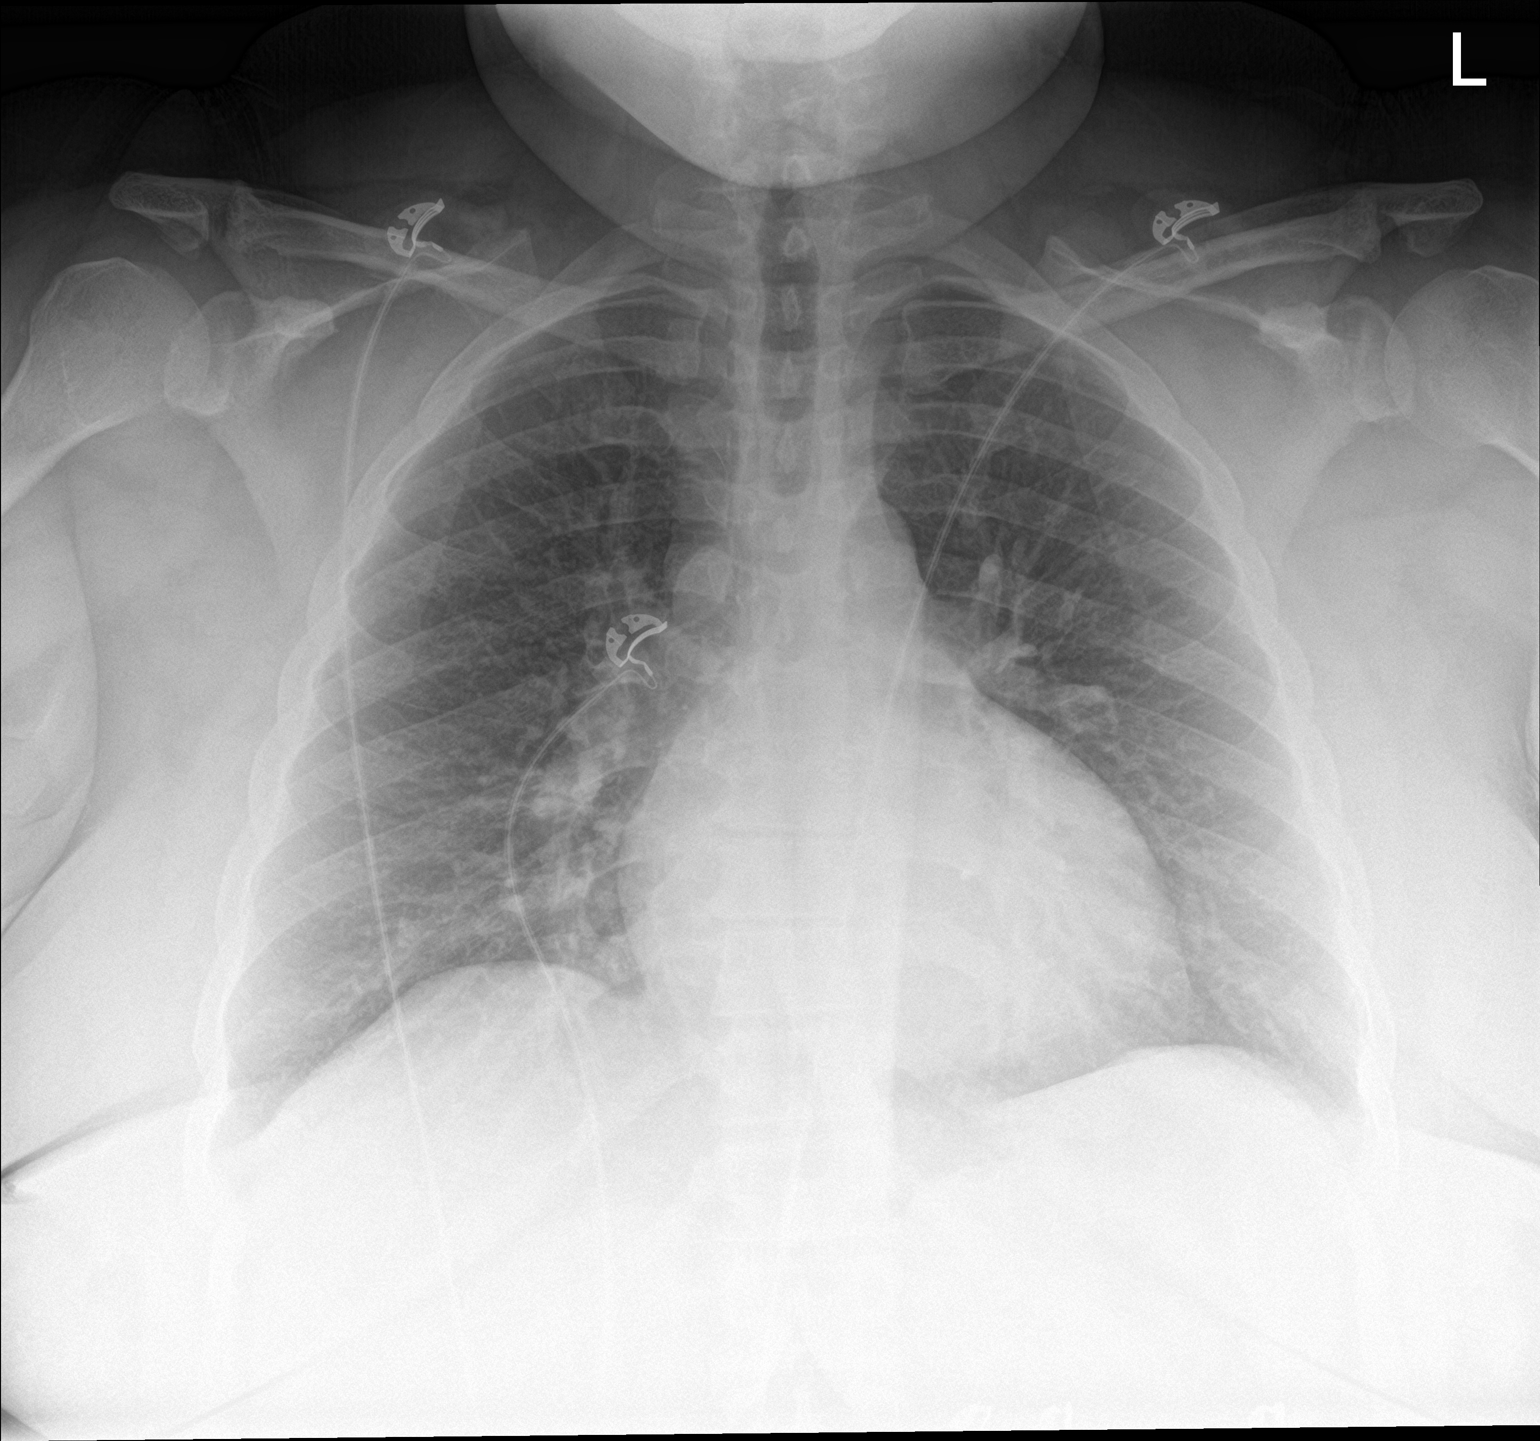

[chest lat]
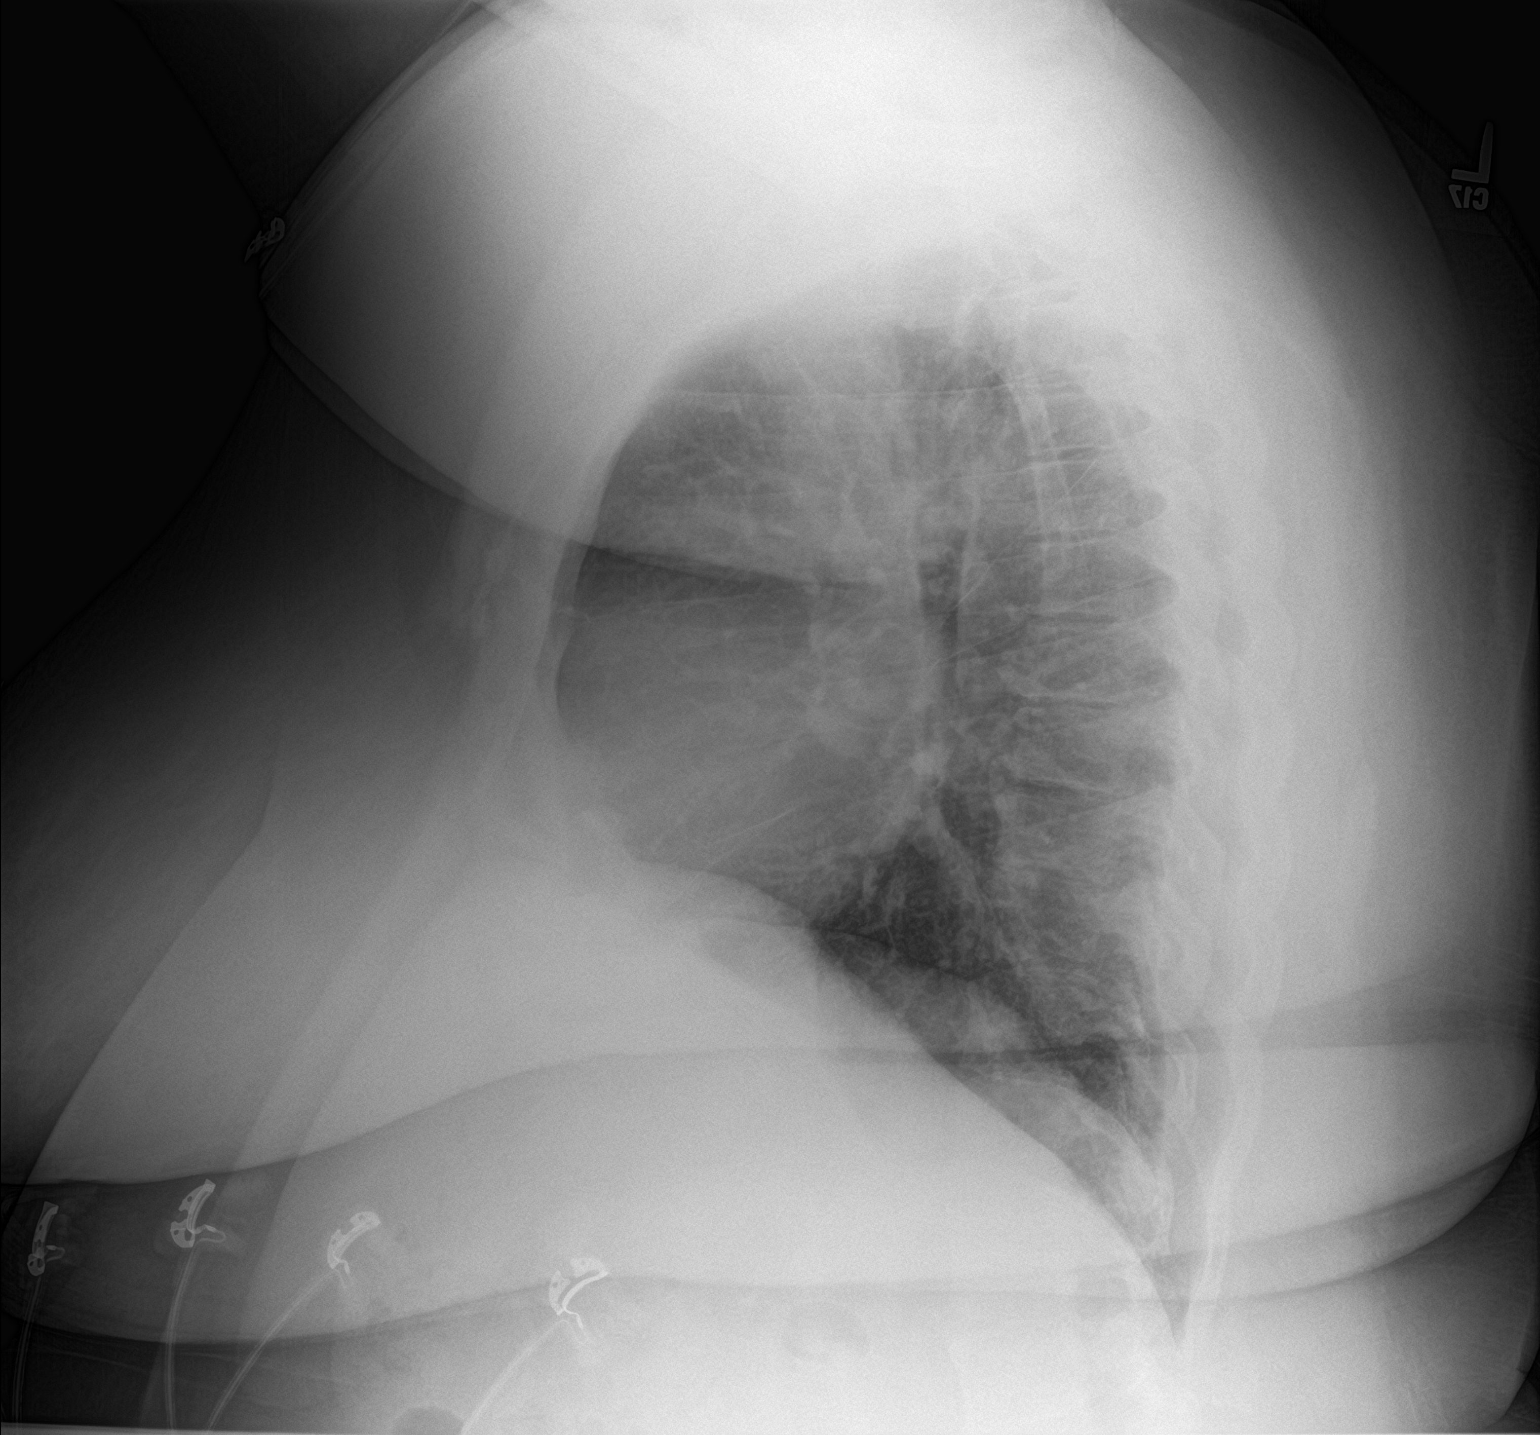

[2 of 2 positions shown; findings below may reference images not displayed]

FINDINGS: Heart size is stable. No pneumothorax. No large pleural effusion. No
infiltrate. No acute osseous abnormality.
IMPRESSION: No active cardiopulmonary disease.

## 2020-01-31 MED ORDER — NITROGLYCERIN 0.4 MG SL SUBL
SUBLINGUAL_TABLET | SUBLINGUAL | Status: AC
  Filled 2020-01-31: qty 2

## 2020-01-31 MED ORDER — NITROGLYCERIN 0.4 MG SL SUBL
0.8000 mg | SUBLINGUAL_TABLET | Freq: Once | SUBLINGUAL | Status: AC
  Administered 2020-01-31: 0.8 mg via SUBLINGUAL

## 2020-01-31 MED ORDER — METOPROLOL TARTRATE 5 MG/5ML IV SOLN: 5.0000 mg | INTRAVENOUS | Status: DC | PRN

## 2020-01-31 MED ORDER — IOHEXOL 350 MG/ML SOLN
80.0000 mL | Freq: Once | INTRAVENOUS | Status: AC | PRN
  Administered 2020-01-31: 80 mL via INTRAVENOUS

## 2020-02-04 ENCOUNTER — Telehealth: Payer: Self-pay | Admitting: Cardiology

## 2020-02-04 NOTE — Telephone Encounter (Signed)
Follow Up:      Pt is returning Pat's call from 02-01-20, concerning her CT results.

## 2020-02-04 NOTE — Telephone Encounter (Signed)
Left the pt a message to call the office back. 

## 2020-02-07 NOTE — Telephone Encounter (Signed)
Reviewed results with patient who verbalized understanding.   She will follow-up with Pulmonary and PCP for continued symptoms. She was grateful for call.

## 2020-02-07 NOTE — Telephone Encounter (Signed)
-----   Message from Freada Bergeron, MD sent at 02/01/2020 12:26 PM EDT ----- Coronary CTA looked good without evidence of any blockages in her arteries. Thankfully, he chest pain and SOB do not appear to be due to cardiac issues. Would recommend continued follow-up with her pulmonary doctors for her asthma and her primary care for the left sided breast pain.

## 2020-02-17 NOTE — Progress Notes (Deleted)
Cardiology Office Note:    Date:  02/17/2020   ID:  Felicia Molina, DOB 1971-04-02, MRN [id number]  PCP:  Ivor Costa  [place] Cardiologist:  No primary care provider on file.  [facility] Electrophysiologist:  None   Referring MD: Felicia Brand, PA-C    History of Present Illness:    Felicia Molina is a 49 y.o. female with a hx of HTN, HLD, morbid obesity and fibromyalgia who presents for follow-up for her chest pain.  During her last visit with me on 01/17/20, the patient was experiencing sharp, left sided atypical chest pain. Had gone to [facility] ER where trops were negative, ECG without ischemic changes. She was supposed to get stress testing but she had to leave as she had her child with her and was unable to find childcare for the overnight stay. She therefore was seen in Cardiology clinic for further evaluation.  TTE 09/27 with normal EF, mild-to-moderate MR. CTA coronaries with no obstructive disease. Calcium score 0.  Labs 01/21/20: Na 143, K 4.3, Cr 1.15, A1C 5.8  Past Medical History:  Diagnosis Date  . Asthma   . Atypical chest pain 07/28/2018  . Back pain, chronic   . Fibromyalgia   . Hypertension   . Morbid obesity ([place])   . Neurogenic bladder   . Pain management contract signed   . Palpitations 07/28/2018  . Panic attacks   . Reflux     Past Surgical History:  Procedure Laterality Date  . BACK SURGERY    . bladder stimulator    . CARPAL TUNNEL RELEASE    . TONSILLECTOMY    . URETERAL STENT PLACEMENT  2008    Current Medications: No outpatient medications have been marked as taking for the 02/18/20 encounter (Appointment) with Freada Bergeron, MD.     Allergies:   Clindamycin/lincomycin, Ibuprofen, Tomato, and Vicodin [hydrocodone-acetaminophen]   Social History   Socioeconomic History  . Marital status: Single    Spouse name: Not on file  . Number of children: 4  . Years of education: Not on file  . Highest education  level: Not on file  Occupational History  . Not on file  Tobacco Use  . Smoking status: Former Research scientist (life sciences)  . Smokeless tobacco: Never Used  Vaping Use  . Vaping Use: Never used  Substance and Sexual Activity  . Alcohol use: No  . Drug use: No  . Sexual activity: Not on file  Other Topics Concern  . Not on file  Social History Narrative  . Not on file   Social Determinants of Health   Financial Resource Strain:   . Difficulty of Paying Living Expenses: Not on file  Food Insecurity:   . Worried About Charity fundraiser in the Last Year: Not on file  . Ran Out of Food in the Last Year: Not on file  Transportation Needs:   . Lack of Transportation (Medical): Not on file  . Lack of Transportation (Non-Medical): Not on file  Physical Activity:   . Days of Exercise per Week: Not on file  . Minutes of Exercise per Session: Not on file  Stress:   . Feeling of Stress : Not on file  Social Connections:   . Frequency of Communication with Friends and Family: Not on file  . Frequency of Social Gatherings with Friends and Family: Not on file  . Attends Religious Services: Not on file  . Active Member of Clubs or Organizations: Not on file  .  Attends Archivist Meetings: Not on file  . Marital Status: Not on file     Family History: The patient's ***family history includes Heart attack in her father and mother; Heart disease in her father and sister.  ROS:   Please see the history of present illness.    *** All other systems reviewed and are negative.  EKGs/Labs/Other Studies Reviewed:    The following studies were reviewed today: TTE 2020-02-08: 1. Poor acoustic windows limit study.  2. Global longitudinal strain is -19.2%. Left ventricular ejection  fraction, by estimation, is 55 to 60%. The left ventricle has normal  function. The left ventricle has no regional wall motion abnormalities.  The left ventricular internal cavity size was  mildly dilated. Left  ventricular diastolic parameters were normal.  3. Right ventricular systolic function is normal. The right ventricular  size is normal. There is normal pulmonary artery systolic pressure.  4. MR jet is eccentric, directed posterior into LA. Mild to moderate  mitral valve regurgitation.  5. Mild aortic valve sclerosis is present, with no evidence of aortic  valve stenosis.  6. The inferior vena cava is normal in size with greater than 50%  respiratory variability, suggesting right atrial pressure of 3 mmHg.  CTA 01/31/20: Coronary Arteries:  Normal coronary origin.  Right dominance.  Left main: The left main is a large caliber vessel with a normal take off from the left coronary cusp that bifurcates to form a left anterior descending artery and a left circumflex artery. There is no plaque or stenosis.  Left anterior descending artery: The LAD is patent without evidence of plaque or stenosis. The LAD gives off 3 patent diagonal branches. There is limited evaluation of the distal segments due to obesity artifact but these appear normal.  Left circumflex artery: The LCX is non-dominant and patent with no evidence of plaque or stenosis. Limited evaluation of the distal LCX due to obesity artifact.  Right coronary artery: The RCA is dominant with normal take off from the right coronary cusp. There is no evidence of plaque or stenosis. The RCA terminates as a PDA and right posterolateral branch without evidence of plaque or stenosis. Again, the distal segments appear normal but there is obesity artifact.  Right Atrium: Right atrial size is within normal limits.  Right Ventricle: The right ventricular cavity is within normal limits.  Left Atrium: Left atrial size is normal in size with no left atrial appendage filling defect.  Left Ventricle: The ventricular cavity size is within normal limits. There are no stigmata of prior infarction. There is no abnormal filling  defect.  Pulmonary arteries: Normal in size without proximal filling defect.  Pulmonary veins: Normal pulmonary venous drainage.  Pericardium: Normal thickness with no significant effusion or calcium present.  Cardiac valves: The aortic valve is trileaflet without significant calcification. The mitral valve is normal structure without significant calcification.  Aorta: Normal caliber with no significant disease.  Extra-cardiac findings: See attached radiology report for non-cardiac structures.  IMPRESSION: 1. Coronary calcium score of 0.  2. Normal coronary origin with right dominance.  3. There is severe signal to noise artifact related to obesity (BMI 55) but the coronary arteries appear to be normal without evidence of plaque or stenosis.  4. Limited evaluation of the distal coronary segments.  RECOMMENDATIONS: 1. No evidence of CAD (0%). Consider non-atherosclerotic causes of chest pain.  12/24/2016: Lexiscan myoview stress test 12/24/2016: 1. This was a 2-day protocol. The resting electrocardiogram demonstrated normal sinus  rhythm, normal resting conduction and no resting arrhythmias. Low voltage. Stress EKG is non-diagnostic for ischemia as it a pharmacologic stress using Lexiscan. Stress symptoms included dyspnea, nausea and dizziness.  2. SPECT images demonstrate small perfusion abnormality of mild intensity in the apical myocardial wall(s) on the stress images. These defects are related to apical thinning. Gated SPECT images reveal normal myocardial thickening and wall motion. The left ventricular ejection fraction was calculated or visually estimated to be 53%. This is a low risk study.    EKG:  EKG is *** ordered today.  The ekg ordered today demonstrates ***  Recent Labs: 04/15/2019: ALT 26 01/09/2020: B Natriuretic Peptide 25.4; Hemoglobin 10.5; Platelets 347 01/21/2020: BUN 10; Creatinine, Ser 1.15; Potassium 4.3; Sodium 143  Recent Lipid  Panel No results found for: CHOL, TRIG, HDL, CHOLHDL, VLDL, LDLCALC, LDLDIRECT   Risk Assessment/Calculations:   {Does this patient have ATRIAL FIBRILLATION?[phone]}   Physical Exam:    VS:  There were no vitals taken for this visit.    Wt Readings from Last 3 Encounters:  01/17/20 285 lb (129.3 kg)  01/09/20 282 lb (127.9 kg)  11/23/19 (!) 286 lb (129.7 kg)     GEN: *** Well nourished, well developed in no acute distress HEENT: Normal NECK: No JVD; No carotid bruits LYMPHATICS: No lymphadenopathy CARDIAC: ***RRR, no murmurs, rubs, gallops RESPIRATORY:  Clear to auscultation without rales, wheezing or rhonchi  ABDOMEN: Soft, non-tender, non-distended MUSCULOSKELETAL:  No edema; No deformity  SKIN: Warm and dry NEUROLOGIC:  Alert and oriented x 3 PSYCHIATRIC:  Normal affect   ASSESSMENT:    No diagnosis found. PLAN:    In order of problems listed above:  1. Dyspnea on Exertion: CTA negative for CAD and calcium score 0. TTE with normal BiV function. Likely not cardiac etiology but may be related to underlying asthma and deconditioning. -Follow-up with pulm as scheduled -Counseled about weight loss as may be contributing to symptoms  2. Atypical Chest Pain: CTA normal. TTE with normal BiV function. -Continue symptomatic management  3.  Hypertension: Well controlled. Managed by PCP -Cotinue atenolol-chlorthalidone -Continue hydralazine 100mg  BID -Continue losartan 100mg  daily  4. Hyperlipidemia -Continue crestor 20mg  -Needs lipid panel  5. Morbid Obesity: Prediabetes (A1C 5.8) BMI 56. -Counseled about diet and exercise as detailed below.   Exercise recommendations: Goal of exercising for at least 30 minutes a day, at least 5 times per week.  Please exercise to a moderate exertion.  This means that while exercising it is difficult to speak in full sentences, however you are not so short of breath that you feel you must stop, and not so comfortable that  you can carry on a full conversation.  Exertion level should be approximately a 5/10, if 10 is the most exertion you can perform.  Diet recommendations: Recommend a heart healthy diet such as the Mediterranean diet.  This diet consists of plant based foods, healthy fats, lean meats, olive oil.  It suggests limiting the intake of simple carbohydrates such as white breads, pastries, and pastas.  It also limits the amount of red meat, wine, and dairy products such as cheese that one should consume on a daily basis.   Medication Adjustments/Labs and Tests Ordered: Current medicines are reviewed at length with the patient today.  Concerns regarding medicines are outlined above.  No orders of the defined types were placed in this encounter.  No orders of the defined types were placed in this encounter.   There are no Patient  Instructions on file for this visit.   Signed, Freada Bergeron, MD  02/17/2020 2:14 PM    [facility]

## 2020-02-18 ENCOUNTER — Ambulatory Visit: Payer: Medicaid Other | Admitting: Cardiology

## 2020-02-21 ENCOUNTER — Encounter: Payer: Self-pay | Admitting: Physician Assistant

## 2020-02-21 NOTE — Progress Notes (Deleted)
Cardiology Office Note    Date:  02/21/2020   ID:  Felicia Molina, DOB 1971-03-07, MRN [id number]  PCP:  Leone Brand, PA-C  Cardiologist:  Freada Bergeron, MD  Electrophysiologist:  None   Chief Complaint: discuss cardiac testing  History of Present Illness:   Felicia Molina is a 49 y.o. female with history of HTN, HLD, fibromyalgia, prolonged QT interval on prior EKG, mild-moderate mitral regurgitation, chronic appearing anemia/mildly elevated creatinine and obesity who was recently seen by Dr. Johney Frame for chest pain.  She had had an ER visit and ruled out for MI.  See her excellent note for details.  Family history was notable for coronary artery disease.  She underwent echocardiogram 01/21/20 with poor acoustic windows limiting the study, EF 55 to 60%, mildly dilated left ventricle, mild to moderate mitral regurgitation.  Coronary CT scan 01/31/2020 showed no evidence of CAD and unremarkable radiology over read.  QTc handcalculated at 470ms  Other issues anemia, kidney, lipids -> pcp  Noncardiac chest pain Mitral regurgitation Essential HTN Anemia unspecified  Labwork independently reviewed: 12/2019 Cr 1.15, K 4.2, A1c 5.8, BNP wnl, Hgb 10.5 2020  Normal LFTs   Past Medical History:  Diagnosis Date  . Asthma   . Atypical chest pain 07/28/2018  . Back pain, chronic   . Fibromyalgia   . Hypertension   . Morbid obesity ([place])   . Neurogenic bladder   . Pain management contract signed   . Palpitations 07/28/2018  . Panic attacks   . Reflux     Past Surgical History:  Procedure Laterality Date  . BACK SURGERY    . bladder stimulator    . CARPAL TUNNEL RELEASE    . TONSILLECTOMY    . URETERAL STENT PLACEMENT  2008    Current Medications: No outpatient medications have been marked as taking for the 02/22/20 encounter (Appointment) with Charlie Pitter, PA-C.   ***   Allergies:   Clindamycin/lincomycin, Ibuprofen, Tomato, and Vicodin  [hydrocodone-acetaminophen]   Social History   Socioeconomic History  . Marital status: Single    Spouse name: Not on file  . Number of children: 4  . Years of education: Not on file  . Highest education level: Not on file  Occupational History  . Not on file  Tobacco Use  . Smoking status: Former Research scientist (life sciences)  . Smokeless tobacco: Never Used  Vaping Use  . Vaping Use: Never used  Substance and Sexual Activity  . Alcohol use: No  . Drug use: No  . Sexual activity: Not on file  Other Topics Concern  . Not on file  Social History Narrative  . Not on file   Social Determinants of Health   Financial Resource Strain:   . Difficulty of Paying Living Expenses: Not on file  Food Insecurity:   . Worried About Charity fundraiser in the Last Year: Not on file  . Ran Out of Food in the Last Year: Not on file  Transportation Needs:   . Lack of Transportation (Medical): Not on file  . Lack of Transportation (Non-Medical): Not on file  Physical Activity:   . Days of Exercise per Week: Not on file  . Minutes of Exercise per Session: Not on file  Stress:   . Feeling of Stress : Not on file  Social Connections:   . Frequency of Communication with Friends and Family: Not on file  . Frequency of Social Gatherings with Friends and Family: Not on file  .  Attends Religious Services: Not on file  . Active Member of Clubs or Organizations: Not on file  . Attends Archivist Meetings: Not on file  . Marital Status: Not on file     Family History:  The patient's ***family history includes Heart attack in her father and mother; Heart disease in her father and sister.  ROS:   Please see the history of present illness. Otherwise, review of systems is positive for ***.  All other systems are reviewed and otherwise negative.    EKGs/Labs/Other Studies Reviewed:    Studies reviewed are outlined and summarized above. Reports included below if pertinent.  2D echo 01/21/20 IMPRESSIONS      1. Poor acoustic windows limit study.  2. Global longitudinal strain is -19.2%. Left ventricular ejection  fraction, by estimation, is 55 to 60%. The left ventricle has normal  function. The left ventricle has no regional wall motion abnormalities.  The left ventricular internal cavity size was  mildly dilated. Left ventricular diastolic parameters were normal.  3. Right ventricular systolic function is normal. The right ventricular  size is normal. There is normal pulmonary artery systolic pressure.  4. MR jet is eccentric, directed posterior into LA. Mild to moderate  mitral valve regurgitation.  5. Mild aortic valve sclerosis is present, with no evidence of aortic  valve stenosis.  6. The inferior vena cava is normal in size with greater than 50%  respiratory variability, suggesting right atrial pressure of 3 mmHg.   Cor Ct 01/2020 ADDENDUM REPORT: 01/31/2020 18:21  CLINICAL DATA:  Chest pain  EXAM: Cardiac/Coronary CTA  TECHNIQUE: The patient was scanned on a [employer]. A 100 kV prospective scan was triggered in the descending thoracic aorta at 111 HU's. Axial non-contrast 3 mm slices were carried out through the heart. The data set was analyzed on a dedicated work station and scored using the [place]. Gantry rotation speed was 250 msecs and collimation was .6 mm. No beta blockade and 0.8 mg of sl NTG was given. The 3D data set was reconstructed in 5% intervals of the 35-75 % of the R-R cycle. Diastolic phases were analyzed on a dedicated work station using MPR, MIP and VRT modes. The patient received 80 cc of contrast.  FINDINGS: Image quality: Average.  Noise artifact is: Moderate signal to noise artifact is present due to obesity (BMI 55).  Coronary Arteries:  Normal coronary origin.  Right dominance.  Left main: The left main is a large caliber vessel with a normal take off from the left coronary cusp that bifurcates to form  a left anterior descending artery and a left circumflex artery. There is no plaque or stenosis.  Left anterior descending artery: The LAD is patent without evidence of plaque or stenosis. The LAD gives off 3 patent diagonal branches. There is limited evaluation of the distal segments due to obesity artifact but these appear normal.  Left circumflex artery: The LCX is non-dominant and patent with no evidence of plaque or stenosis. Limited evaluation of the distal LCX due to obesity artifact.  Right coronary artery: The RCA is dominant with normal take off from the right coronary cusp. There is no evidence of plaque or stenosis. The RCA terminates as a PDA and right posterolateral branch without evidence of plaque or stenosis. Again, the distal segments appear normal but there is obesity artifact.  Right Atrium: Right atrial size is within normal limits.  Right Ventricle: The right ventricular cavity is within normal limits.  Left Atrium: Left atrial size is normal in size with no left atrial appendage filling defect.  Left Ventricle: The ventricular cavity size is within normal limits. There are no stigmata of prior infarction. There is no abnormal filling defect.  Pulmonary arteries: Normal in size without proximal filling defect.  Pulmonary veins: Normal pulmonary venous drainage.  Pericardium: Normal thickness with no significant effusion or calcium present.  Cardiac valves: The aortic valve is trileaflet without significant calcification. The mitral valve is normal structure without significant calcification.  Aorta: Normal caliber with no significant disease.  Extra-cardiac findings: See attached radiology report for non-cardiac structures.  IMPRESSION: 1. Coronary calcium score of 0.  2. Normal coronary origin with right dominance.  3. There is severe signal to noise artifact related to obesity (BMI 55) but the coronary arteries appear to be  normal without evidence of plaque or stenosis.  4. Limited evaluation of the distal coronary segments.  RECOMMENDATIONS: 1. No evidence of CAD (0%). Consider non-atherosclerotic causes of chest pain.  Eleonore Chiquito, MD    EKG:  EKG is ordered today, personally reviewed, demonstrating ***  Recent Labs: 04/15/2019: ALT 26 01/09/2020: B Natriuretic Peptide 25.4; Hemoglobin 10.5; Platelets 347 01/21/2020: BUN 10; Creatinine, Ser 1.15; Potassium 4.3; Sodium 143  Recent Lipid Panel No results found for: CHOL, TRIG, HDL, CHOLHDL, VLDL, LDLCALC, LDLDIRECT  PHYSICAL EXAM:    VS:  There were no vitals taken for this visit.  BMI: There is no height or weight on file to calculate BMI.  GEN: Well nourished, well developed, in no acute distress HEENT: normocephalic, atraumatic Neck: no JVD, carotid bruits, or masses Cardiac: ***RRR; no murmurs, rubs, or gallops, no edema  Respiratory:  clear to auscultation bilaterally, normal work of breathing GI: soft, nontender, nondistended, + BS MS: no deformity or atrophy Skin: warm and dry, no rash Neuro:  Alert and Oriented x 3, Strength and sensation are intact, follows commands Psych: euthymic mood, full affect  Wt Readings from Last 3 Encounters:  01/17/20 285 lb (129.3 kg)  01/09/20 282 lb (127.9 kg)  11/23/19 (!) 286 lb (129.7 kg)     ASSESSMENT & PLAN:   1. ***  Disposition: F/u with ***   Medication Adjustments/Labs and Tests Ordered: Current medicines are reviewed at length with the patient today.  Concerns regarding medicines are outlined above. Medication changes, Labs and Tests ordered today are summarized above and listed in the Patient Instructions accessible in Encounters.   Signed, Charlie Pitter, PA-C  02/21/2020 5:57 PM    [facility] [place], [place], [place]  [address] Phone: [phone]; Fax: [phone]

## 2020-02-22 ENCOUNTER — Ambulatory Visit: Payer: Medicaid Other | Admitting: Physician Assistant

## 2020-03-17 NOTE — Progress Notes (Deleted)
Cardiology Office Note:    Date:  03/17/2020   ID:  Felicia Molina, DOB 1970/09/22, MRN [id number]  PCP:  Leone Brand, PA-C  [facility] Cardiologist:  Freada Bergeron, MD  [facility] Electrophysiologist:  None   Referring MD: Leone Brand, PA-C    History of Present Illness:    Felicia Molina is a 49 y.o. female with a hx of HTN, HLD, morbid obesity and fibromyalgia who presents for follow-up for her chest pain.  During her last visit with me on 01/17/20, the patient was experiencing sharp, left sided atypical chest pain. Had gone to [facility] ER where trops were negative, ECG without ischemic changes. She was supposed to get stress testing but she had to leave as she had her child with her and was unable to find childcare for the overnight stay. She therefore was seen in Cardiology clinic for further evaluation.  TTE 09/27 with normal EF, mild-to-moderate MR. CTA coronaries with no obstructive disease. Calcium score 0.  Labs 01/21/20: Na 143, K 4.3, Cr 1.15, A1C 5.8  Past Medical History:  Diagnosis Date  . Anemia   . Asthma   . Atypical chest pain 07/28/2018  . Back pain, chronic   . Fibromyalgia   . Hypertension   . Mitral regurgitation   . Morbid obesity ([place])   . Neurogenic bladder   . Pain management contract signed   . Palpitations 07/28/2018  . Panic attacks   . Prolonged Q-T interval on ECG   . Reflux     Past Surgical History:  Procedure Laterality Date  . BACK SURGERY    . bladder stimulator    . CARPAL TUNNEL RELEASE    . TONSILLECTOMY    . URETERAL STENT PLACEMENT  2008    Current Medications: No outpatient medications have been marked as taking for the 03/19/20 encounter (Appointment) with Freada Bergeron, MD.     Allergies:   Clindamycin/lincomycin, Ibuprofen, Tomato, and Vicodin [hydrocodone-acetaminophen]   Social History   Socioeconomic History  . Marital status: Single    Spouse name: Not on file  . Number of  children: 4  . Years of education: Not on file  . Highest education level: Not on file  Occupational History  . Not on file  Tobacco Use  . Smoking status: Former Research scientist (life sciences)  . Smokeless tobacco: Never Used  Vaping Use  . Vaping Use: Never used  Substance and Sexual Activity  . Alcohol use: No  . Drug use: No  . Sexual activity: Not on file  Other Topics Concern  . Not on file  Social History Narrative  . Not on file   Social Determinants of Health   Financial Resource Strain:   . Difficulty of Paying Living Expenses: Not on file  Food Insecurity:   . Worried About Charity fundraiser in the Last Year: Not on file  . Ran Out of Food in the Last Year: Not on file  Transportation Needs:   . Lack of Transportation (Medical): Not on file  . Lack of Transportation (Non-Medical): Not on file  Physical Activity:   . Days of Exercise per Week: Not on file  . Minutes of Exercise per Session: Not on file  Stress:   . Feeling of Stress : Not on file  Social Connections:   . Frequency of Communication with Friends and Family: Not on file  . Frequency of Social Gatherings with Friends and Family: Not on file  . Attends Religious  Services: Not on file  . Active Member of Clubs or Organizations: Not on file  . Attends Archivist Meetings: Not on file  . Marital Status: Not on file     Family History: The patient's ***family history includes Heart attack in her father and mother; Heart disease in her father and sister.  ROS:   Please see the history of present illness.    *** All other systems reviewed and are negative.  EKGs/Labs/Other Studies Reviewed:    The following studies were reviewed today: The following studies were reviewed today: TTE 12-Feb-2020: 1. Poor acoustic windows limit study.  2. Global longitudinal strain is -19.2%. Left ventricular ejection  fraction, by estimation, is 55 to 60%. The left ventricle has normal  function. The left ventricle has no  regional wall motion abnormalities.  The left ventricular internal cavity size was  mildly dilated. Left ventricular diastolic parameters were normal.  3. Right ventricular systolic function is normal. The right ventricular  size is normal. There is normal pulmonary artery systolic pressure.  4. MR jet is eccentric, directed posterior into LA. Mild to moderate  mitral valve regurgitation.  5. Mild aortic valve sclerosis is present, with no evidence of aortic  valve stenosis.  6. The inferior vena cava is normal in size with greater than 50%  respiratory variability, suggesting right atrial pressure of 3 mmHg.  CTA 01/31/20: Coronary Arteries: Normal coronary origin. Right dominance.  Left main: The left main is a large caliber vessel with a normal take off from the left coronary cusp that bifurcates to form a left anterior descending artery and a left circumflex artery. There is no plaque or stenosis.  Left anterior descending artery: The LAD is patent without evidence of plaque or stenosis. The LAD gives off 3 patent diagonal branches. There is limited evaluation of the distal segments due to obesity artifact but these appear normal.  Left circumflex artery: The LCX is non-dominant and patent with no evidence of plaque or stenosis. Limited evaluation of the distal LCX due to obesity artifact.  Right coronary artery: The RCA is dominant with normal take off from the right coronary cusp. There is no evidence of plaque or stenosis. The RCA terminates as a PDA and right posterolateral branch without evidence of plaque or stenosis. Again, the distal segments appear normal but there is obesity artifact.  Right Atrium: Right atrial size is within normal limits.  Right Ventricle: The right ventricular cavity is within normal limits.  Left Atrium: Left atrial size is normal in size with no left atrial appendage filling defect.  Left Ventricle: The ventricular cavity  size is within normal limits. There are no stigmata of prior infarction. There is no abnormal filling defect.  Pulmonary arteries: Normal in size without proximal filling defect.  Pulmonary veins: Normal pulmonary venous drainage.  Pericardium: Normal thickness with no significant effusion or calcium present.  Cardiac valves: The aortic valve is trileaflet without significant calcification. The mitral valve is normal structure without significant calcification.  Aorta: Normal caliber with no significant disease.  Extra-cardiac findings: See attached radiology report for non-cardiac structures.  IMPRESSION: 1. Coronary calcium score of 0.  2. Normal coronary origin with right dominance.  3. There is severe signal to noise artifact related to obesity (BMI 55) but the coronary arteries appear to be normal without evidence of plaque or stenosis.  4. Limited evaluation of the distal coronary segments.  RECOMMENDATIONS: 1. No evidence of CAD (0%). Consider non-atherosclerotic causes of  chest pain.  12/24/2016: Lexiscan myoview stress test 12/24/2016: 1. This was a 2-day protocol. The resting electrocardiogram demonstrated normal sinus rhythm, normal resting conduction and no resting arrhythmias. Low voltage. Stress EKG is non-diagnostic for ischemia as it a pharmacologic stress using Lexiscan. Stress symptoms included dyspnea, nausea and dizziness.  2. SPECT images demonstrate small perfusion abnormality of mild intensity in the apical myocardial wall(s) on the stress images. These defects are related to apical thinning. Gated SPECT images reveal normal myocardial thickening and wall motion. The left ventricular ejection fraction was calculated or visually estimated to be 53%. This is a low risk study.   EKG:  EKG is *** ordered today.  The ekg ordered today demonstrates ***  Recent Labs: 04/15/2019: ALT 26 01/09/2020: B Natriuretic Peptide 25.4; Hemoglobin  10.5; Platelets 347 01/21/2020: BUN 10; Creatinine, Ser 1.15; Potassium 4.3; Sodium 143  Recent Lipid Panel No results found for: CHOL, TRIG, HDL, CHOLHDL, VLDL, LDLCALC, LDLDIRECT   Risk Assessment/Calculations:   {Does this patient have ATRIAL FIBRILLATION?[phone]}   Physical Exam:    VS:  There were no vitals taken for this visit.    Wt Readings from Last 3 Encounters:  01/17/20 285 lb (129.3 kg)  01/09/20 282 lb (127.9 kg)  11/23/19 (!) 286 lb (129.7 kg)     GEN: *** Well nourished, well developed in no acute distress HEENT: Normal NECK: No JVD; No carotid bruits LYMPHATICS: No lymphadenopathy CARDIAC: ***RRR, no murmurs, rubs, gallops RESPIRATORY:  Clear to auscultation without rales, wheezing or rhonchi  ABDOMEN: Soft, non-tender, non-distended MUSCULOSKELETAL:  No edema; No deformity  SKIN: Warm and dry NEUROLOGIC:  Alert and oriented x 3 PSYCHIATRIC:  Normal affect   ASSESSMENT:    No diagnosis found. PLAN:    In order of problems listed above:  1. Dyspnea on Exertion: CTA negative for CAD and calcium score 0. TTE with normal BiV function. Likely not cardiac etiology but may be related to underlying asthma and deconditioning. -Follow-up with pulm as scheduled -Counseled about weight loss as may be contributing to symptoms  2. Atypical Chest Pain: CTA normal. TTE with normal BiV function. -Continue symptomatic management  3. Hypertension: Well controlled. Managed by PCP -Cotinue atenolol-chlorthalidone -Continue hydralazine 100mg  BID -Continue losartan 100mg  daily  4. Hyperlipidemia -Continue crestor 20mg  -Needs lipid panel  5.Morbid Obesity: Prediabetes (A1C 5.8) BMI 56. -Counseled about diet and exercise as detailed below.   Exercise recommendations: Goal of exercising for at least 30 minutes a day, at least 5 times per week.  Please exercise to a moderate exertion.  This means that while exercising it is difficult to speak in full  sentences, however you are not so short of breath that you feel you must stop, and not so comfortable that you can carry on a full conversation.  Exertion level should be approximately a 5/10, if 10 is the most exertion you can perform.  Diet recommendations: Recommend a heart healthy diet such as the Mediterranean diet.  This diet consists of plant based foods, healthy fats, lean meats, olive oil.  It suggests limiting the intake of simple carbohydrates such as white breads, pastries, and pastas.  It also limits the amount of red meat, wine, and dairy products such as cheese that one should consume on a daily basis.    Shared Decision Making/Informed Consent   {Are you ordering a CV Procedure (e.g. stress test, cath, DCCV, TEE, etc)?   Press F2        :[id number]}  Medication Adjustments/Labs and Tests Ordered: Current medicines are reviewed at length with the patient today.  Concerns regarding medicines are outlined above.  No orders of the defined types were placed in this encounter.  No orders of the defined types were placed in this encounter.   There are no Patient Instructions on file for this visit.   Signed, Freada Bergeron, MD  03/17/2020 8:11 AM    [place]

## 2020-03-19 ENCOUNTER — Ambulatory Visit: Payer: Medicaid Other | Admitting: Cardiology

## 2020-03-27 ENCOUNTER — Other Ambulatory Visit: Payer: Self-pay | Admitting: Anesthesiology

## 2020-03-27 ENCOUNTER — Other Ambulatory Visit: Payer: Self-pay

## 2020-03-27 ENCOUNTER — Ambulatory Visit
Admission: RE | Admit: 2020-03-27 | Discharge: 2020-03-27 | Disposition: A | Discharge status: Home / Self Care | Payer: Medicaid Other | Source: Ambulatory Visit | Attending: Anesthesiology | Admitting: Anesthesiology

## 2020-03-27 DIAGNOSIS — M170 Bilateral primary osteoarthritis of knee: Secondary | ICD-10-CM

## 2020-04-06 NOTE — Progress Notes (Deleted)
Cardiology Office Note:    Date:  04/06/2020   ID:  Felicia Molina, DOB 16-Jan-1971, MRN [id number]  PCP:  Leone Brand, PA-C  [facility] Cardiologist:  Freada Bergeron, MD  [facility] Electrophysiologist:  None   Referring MD: Leone Brand, PA-C     History of Present Illness:    Felicia Molina is a 49 y.o. female with a hx of HTN, HLD, morbid obesity and fibromyalgia who presents for follow-up for her chest pain.  During her last visit with me on 01/17/20, the patient was experiencing sharp, left sided atypical chest pain. Had gone to [facility] ER where trops were negative, ECG without ischemic changes. She was supposed to get stress testing but she had to leave as she had her child with her and was unable to find childcare for the overnight stay. She therefore was seen in Cardiology clinic for further evaluation.  TTE Feb 18, 2023 with normal EF, mild-to-moderate MR. CTA coronaries with no obstructive disease. Calcium score 0.  Labs 2020/02/18: Na 143, K 4.3, Cr 1.15, A1C 5.8  Past Medical History:  Diagnosis Date  . Anemia   . Asthma   . Atypical chest pain 07/28/2018  . Back pain, chronic   . Fibromyalgia   . Hypertension   . Mitral regurgitation   . Morbid obesity ([place])   . Neurogenic bladder   . Pain management contract signed   . Palpitations 07/28/2018  . Panic attacks   . Prolonged Q-T interval on ECG   . Reflux     Past Surgical History:  Procedure Laterality Date  . BACK SURGERY    . bladder stimulator    . CARPAL TUNNEL RELEASE    . TONSILLECTOMY    . URETERAL STENT PLACEMENT  2008    Current Medications: No outpatient medications have been marked as taking for the 04/08/20 encounter (Appointment) with Freada Bergeron, MD.     Allergies:   Clindamycin/lincomycin, Ibuprofen, Tomato, and Vicodin [hydrocodone-acetaminophen]   Social History   Socioeconomic History  . Marital status: Single    Spouse name: Not on file  . Number of  children: 4  . Years of education: Not on file  . Highest education level: Not on file  Occupational History  . Not on file  Tobacco Use  . Smoking status: Former Research scientist (life sciences)  . Smokeless tobacco: Never Used  Vaping Use  . Vaping Use: Never used  Substance and Sexual Activity  . Alcohol use: No  . Drug use: No  . Sexual activity: Not on file  Other Topics Concern  . Not on file  Social History Narrative  . Not on file   Social Determinants of Health   Financial Resource Strain: Not on file  Food Insecurity: Not on file  Transportation Needs: Not on file  Physical Activity: Not on file  Stress: Not on file  Social Connections: Not on file     Family History: The patient's ***family history includes Heart attack in her father and mother; Heart disease in her father and sister.  ROS:   Please see the history of present illness.    *** All other systems reviewed and are negative.  EKGs/Labs/Other Studies Reviewed:    The following studies were reviewed today: TTE 2020-02-18: 1. Poor acoustic windows limit study.  2. Global longitudinal strain is -19.2%. Left ventricular ejection  fraction, by estimation, is 55 to 60%. The left ventricle has normal  function. The left ventricle has no regional wall motion  abnormalities.  The left ventricular internal cavity size was  mildly dilated. Left ventricular diastolic parameters were normal.  3. Right ventricular systolic function is normal. The right ventricular  size is normal. There is normal pulmonary artery systolic pressure.  4. MR jet is eccentric, directed posterior into LA. Mild to moderate  mitral valve regurgitation.  5. Mild aortic valve sclerosis is present, with no evidence of aortic  valve stenosis.  6. The inferior vena cava is normal in size with greater than 50%  respiratory variability, suggesting right atrial pressure of 3 mmHg.  CTA 01/31/20: Coronary Arteries: Normal coronary origin. Right  dominance.  Left main: The left main is a large caliber vessel with a normal take off from the left coronary cusp that bifurcates to form a left anterior descending artery and a left circumflex artery. There is no plaque or stenosis.  Left anterior descending artery: The LAD is patent without evidence of plaque or stenosis. The LAD gives off 3 patent diagonal branches. There is limited evaluation of the distal segments due to obesity artifact but these appear normal.  Left circumflex artery: The LCX is non-dominant and patent with no evidence of plaque or stenosis. Limited evaluation of the distal LCX due to obesity artifact.  Right coronary artery: The RCA is dominant with normal take off from the right coronary cusp. There is no evidence of plaque or stenosis. The RCA terminates as a PDA and right posterolateral branch without evidence of plaque or stenosis. Again, the distal segments appear normal but there is obesity artifact.  Right Atrium: Right atrial size is within normal limits.  Right Ventricle: The right ventricular cavity is within normal limits.  Left Atrium: Left atrial size is normal in size with no left atrial appendage filling defect.  Left Ventricle: The ventricular cavity size is within normal limits. There are no stigmata of prior infarction. There is no abnormal filling defect.  Pulmonary arteries: Normal in size without proximal filling defect.  Pulmonary veins: Normal pulmonary venous drainage.  Pericardium: Normal thickness with no significant effusion or calcium present.  Cardiac valves: The aortic valve is trileaflet without significant calcification. The mitral valve is normal structure without significant calcification.  Aorta: Normal caliber with no significant disease.  Extra-cardiac findings: See attached radiology report for non-cardiac structures.  IMPRESSION: 1. Coronary calcium score of 0.  2. Normal coronary  origin with right dominance.  3. There is severe signal to noise artifact related to obesity (BMI 55) but the coronary arteries appear to be normal without evidence of plaque or stenosis.  4. Limited evaluation of the distal coronary segments.  RECOMMENDATIONS: 1. No evidence of CAD (0%). Consider non-atherosclerotic causes of chest pain.  12/24/2016: Lexiscan myoview stress test 12/24/2016: 1. This was a 2-day protocol. The resting electrocardiogram demonstrated normal sinus rhythm, normal resting conduction and no resting arrhythmias. Low voltage. Stress EKG is non-diagnostic for ischemia as it a pharmacologic stress using Lexiscan. Stress symptoms included dyspnea, nausea and dizziness.  2. SPECT images demonstrate small perfusion abnormality of mild intensity in the apical myocardial wall(s) on the stress images. These defects are related to apical thinning. Gated SPECT images reveal normal myocardial thickening and wall motion. The left ventricular ejection fraction was calculated or visually estimated to be 53%. This is a low risk study.   EKG:  EKG is *** ordered today.  The ekg ordered today demonstrates ***  Recent Labs: 04/15/2019: ALT 26 01/09/2020: B Natriuretic Peptide 25.4; Hemoglobin 10.5; Platelets 347 01/21/2020:  BUN 10; Creatinine, Ser 1.15; Potassium 4.3; Sodium 143  Recent Lipid Panel No results found for: CHOL, TRIG, HDL, CHOLHDL, VLDL, LDLCALC, LDLDIRECT   Risk Assessment/Calculations:   {Does this patient have ATRIAL FIBRILLATION?[phone]}   Physical Exam:    VS:  There were no vitals taken for this visit.    Wt Readings from Last 3 Encounters:  01/17/20 285 lb (129.3 kg)  01/09/20 282 lb (127.9 kg)  11/23/19 (!) 286 lb (129.7 kg)     GEN: *** Well nourished, well developed in no acute distress HEENT: Normal NECK: No JVD; No carotid bruits LYMPHATICS: No lymphadenopathy CARDIAC: ***RRR, no murmurs, rubs, gallops RESPIRATORY:  Clear  to auscultation without rales, wheezing or rhonchi  ABDOMEN: Soft, non-tender, non-distended MUSCULOSKELETAL:  No edema; No deformity  SKIN: Warm and dry NEUROLOGIC:  Alert and oriented x 3 PSYCHIATRIC:  Normal affect   ASSESSMENT:    No diagnosis found. PLAN:    In order of problems listed above:  1. Dyspnea on Exertion: CTA negative for CAD and calcium score 0. TTE with normal BiV function. Likely not cardiac etiology but may be related to underlying asthma and deconditioning. -Follow-up with pulm as scheduled -Counseled about weight loss as may be contributing to symptoms  2.AtypicalChest Pain: CTA normal. TTE with normal BiV function. -Continue symptomatic management  3. Hypertension: Well controlled. Managed by PCP -Cotinue atenolol-chlorthalidone -Continue hydralazine 100mg  BID -Continue losartan 100mg  daily  4. Hyperlipidemia -Continue crestor 20mg  -Needs lipid panel  5.Morbid Obesity: Prediabetes (A1C 5.8) BMI 56. -Counseled about diet and exercise as detailed below.  Exercise recommendations: Goal of exercising for at least 30 minutes a day, at least 5 times per week. Please exercise to a moderate exertion. This means that while exercising it is difficult to speak in full sentences, however you are not so short of breath that you feel you must stop, and not so comfortable that you can carry on a full conversation. Exertion level should be approximately a 5/10, if 10 is the most exertion you can perform.  Diet recommendations: Recommend a heart healthy diet such as the Mediterranean diet. This diet consists of plant based foods, healthy fats, lean meats, olive oil. It suggests limiting the intake of simple carbohydrates such as white breads, pastries, and pastas. It also limits the amount of red meat, wine, and dairy products such as cheese that one should consume on a daily basis.     {Are you ordering a CV Procedure (e.g. stress test, cath,  DCCV, TEE, etc)?   Press F2        :[id number]}    Medication Adjustments/Labs and Tests Ordered: Current medicines are reviewed at length with the patient today.  Concerns regarding medicines are outlined above.  No orders of the defined types were placed in this encounter.  No orders of the defined types were placed in this encounter.   There are no Patient Instructions on file for this visit.   Signed, Freada Bergeron, MD  04/06/2020 4:23 PM    [place]

## 2020-04-08 ENCOUNTER — Ambulatory Visit: Payer: Medicaid Other | Admitting: Cardiology

## 2020-04-14 ENCOUNTER — Telehealth: Payer: Self-pay

## 2020-04-14 NOTE — Telephone Encounter (Signed)
Able to start with next PREP class Jan 3rd M/W 10a-115a at [place] scheduled for 12/30 at [place] at [facility]

## 2020-04-22 ENCOUNTER — Telehealth: Payer: Self-pay | Admitting: Cardiology

## 2020-04-22 NOTE — Telephone Encounter (Signed)
Spoke with the patient who states that she has been having chest pain for the past 30 minutes. She states that it is located on the left side of her breast and goes under her armpit. She has a knot under her armpit as well. She states that she has a headache. She denies SOB, dizziness, nausea, vomiting. She states that she thought it was heartburn but took medications and pain still not relieved. She has also taken an oxycodone with no relief in pain. She state that it is a sharp pain. She states that it does hurt to push on it.  Advised patient that her CT scan looked good with no CAD. Dr. Shari Prows did not think that her pain was cardiac related. Patient verbalized understanding. She states that she is going to go to the ER to be sure and because she is in so much pain.

## 2020-04-22 NOTE — Telephone Encounter (Signed)
Pt c/o of Chest Pain: STAT if CP now or developed within 24 hours  1. Are you having CP right now? Yes   2. Are you experiencing any other symptoms (ex. SOB, nausea, vomiting, sweating)? Patient states she also has a knot under her armpit- no additional symptoms.  3. How long have you been experiencing CP? About 30 minutes, per patient  4. Is your CP continuous or coming and going? Continuous   5. Have you taken Nitroglycerin? No  ?

## 2020-04-30 ENCOUNTER — Other Ambulatory Visit: Payer: Self-pay

## 2020-04-30 ENCOUNTER — Emergency Department (HOSPITAL_BASED_OUTPATIENT_CLINIC_OR_DEPARTMENT_OTHER)
Admission: EM | Admit: 2020-04-30 | Discharge: 2020-04-30 | Disposition: A | Discharge status: Home / Self Care | Payer: Medicaid Other | Attending: Emergency Medicine | Admitting: Emergency Medicine

## 2020-04-30 ENCOUNTER — Encounter (HOSPITAL_BASED_OUTPATIENT_CLINIC_OR_DEPARTMENT_OTHER): Payer: Self-pay

## 2020-04-30 DIAGNOSIS — J45909 Unspecified asthma, uncomplicated: Secondary | ICD-10-CM | POA: Diagnosis not present

## 2020-04-30 DIAGNOSIS — I10 Essential (primary) hypertension: Secondary | ICD-10-CM | POA: Diagnosis not present

## 2020-04-30 DIAGNOSIS — Z7951 Long term (current) use of inhaled steroids: Secondary | ICD-10-CM | POA: Insufficient documentation

## 2020-04-30 DIAGNOSIS — Z79899 Other long term (current) drug therapy: Secondary | ICD-10-CM | POA: Diagnosis not present

## 2020-04-30 DIAGNOSIS — R058 Other specified cough: Secondary | ICD-10-CM

## 2020-04-30 DIAGNOSIS — R059 Cough, unspecified: Secondary | ICD-10-CM | POA: Diagnosis present

## 2020-04-30 DIAGNOSIS — Z87891 Personal history of nicotine dependence: Secondary | ICD-10-CM | POA: Insufficient documentation

## 2020-04-30 DIAGNOSIS — U071 COVID-19: Secondary | ICD-10-CM | POA: Diagnosis not present

## 2020-04-30 DIAGNOSIS — Z20822 Contact with and (suspected) exposure to covid-19: Secondary | ICD-10-CM

## 2020-04-30 LAB — RESP PANEL BY RT-PCR (FLU A&B, COVID) ARPGX2
Influenza A by PCR: NEGATIVE
Influenza B by PCR: NEGATIVE
SARS Coronavirus 2 by RT PCR: POSITIVE — AB

## 2020-04-30 MED ORDER — ONDANSETRON HCL 4 MG PO TABS: 4.0000 mg | ORAL_TABLET | Freq: Three times a day (TID) | ORAL | 0 refills | Status: AC | PRN

## 2020-04-30 NOTE — ED Triage Notes (Addendum)
Pt reports headache, chills and cough. Pt also reports chest congestion and pain in chest when she coughs. Pt reports her daughter is covid positive and they were all at a funeral 3 days ago.

## 2020-04-30 NOTE — ED Provider Notes (Signed)
[facility] EMERGENCY DEPARTMENT Provider Note   CSN: [id number] Arrival date & time: 04/30/20  3474     History No chief complaint on file.   Felicia Molina is a 50 y.o. female.  HPI    Patient with significant medical history of asthma, hypertension, obesity, presents to the emergency department with chief complaint of URI-like symptoms.  Patient states symptoms started on Friday and have progressively gotten worse.  She endorses headaches, fevers, chills, nasal congestion, productive cough, nausea and vomiting but denies chest pain or shortness of breath.  She endorses that her daughter tested positive for COVID-19 and she has been in close contact with her, she is not vaccine against COVID-19 and is not immunocompromised.  She denies any alleviating factors.  Patient denies chest pain, shortness of breath, urinary symptoms, diarrhea, constipation, pedal edema.  Past Medical History:  Diagnosis Date  . Anemia   . Asthma   . Atypical chest pain 07/28/2018  . Back pain, chronic   . Fibromyalgia   . Hypertension   . Mitral regurgitation   . Morbid obesity (HCC)   . Neurogenic bladder   . Pain management contract signed   . Palpitations 07/28/2018  . Panic attacks   . Prolonged Q-T interval on ECG   . Reflux     Patient Active Problem List   Diagnosis Date Noted  . Atypical chest pain 07/28/2018  . Palpitations 07/28/2018  . Right wrist pain 11/08/2017  . Asthma 10/14/2017  . Depression 10/14/2017  . GERD (gastroesophageal reflux disease) 10/14/2017  . Fibroid 09/09/2017  . Morbid obesity with BMI of 50.0-59.9, adult (HCC) 08/05/2016  . Rheumatoid arthritis (HCC) 08/05/2016  . Chronic bilateral low back pain without sciatica 05/13/2015  . Plantar fasciitis, bilateral 07/19/2012  . Metatarsalgia of both feet 07/19/2012  . Acute respiratory failure (HCC) 05/07/2012  . Hypertension   . CAP (community acquired pneumonia) 05/06/2012    Past Surgical History:   Procedure Laterality Date  . BACK SURGERY    . bladder stimulator    . CARPAL TUNNEL RELEASE    . TONSILLECTOMY    . URETERAL STENT PLACEMENT  2008     OB History    Gravida  6   Para  4   Term  4   Preterm  0   AB  2   Living  4     SAB  2   IAB  0   Ectopic  0   Multiple  0   Live Births  4           Family History  Problem Relation Age of Onset  . Heart attack Mother   . Heart disease Father   . Heart attack Father   . Heart disease Sister     Social History   Tobacco Use  . Smoking status: Former Games developer  . Smokeless tobacco: Never Used  Vaping Use  . Vaping Use: Never used  Substance Use Topics  . Alcohol use: No  . Drug use: No    Home Medications Prior to Admission medications   Medication Sig Start Date End Date Taking? Authorizing Provider  ondansetron (ZOFRAN) 4 MG tablet Take 1 tablet (4 mg total) by mouth every 8 (eight) hours as needed for nausea or vomiting. 04/30/20  Yes Carroll Sage, PA-C  albuterol (VENTOLIN HFA) 108 (90 Base) MCG/ACT inhaler Inhale 2 puffs into the lungs every 4 (four) hours as needed for wheezing or shortness of breath. 04/07/19  Noemi Chapel, MD  atenolol-chlorthalidone (TENORETIC) 100-25 MG tablet Take 1 tablet by mouth daily.  10/14/17   [provider]  budesonide-formoterol (SYMBICORT) 160-4.5 MCG/ACT inhaler Inhale 2 puffs into the lungs 2 (two) times daily.    [provider]  buPROPion (WELLBUTRIN SR) 150 MG 12 hr tablet Take 150 mg by mouth 2 (two) times daily.  10/14/17   [provider]  cetirizine (ZYRTEC) 10 MG tablet Take 10 mg by mouth daily.  10/14/17   [provider]  cyclobenzaprine (FLEXERIL) 10 MG tablet Take 10 mg by mouth 3 (three) times daily as needed. Takes for muscle spasms    [provider]  diclofenac sodium (VOLTAREN) 1 % GEL Apply 2 g topically 4 (four) times daily.    [provider]  fluticasone (FLONASE) 50 MCG/ACT nasal  spray 2 SPRAYS INTO EACH NOSTRIL DAILY 10/14/17   [provider]  furosemide (LASIX) 40 MG tablet Take 1 tablet (40 mg total) by mouth daily. Patient taking differently: Take 20 mg by mouth daily.  05/09/12   Verlee Monte, MD  hydrALAZINE (APRESOLINE) 100 MG tablet Take 100 mg by mouth 2 (two) times daily. 12/08/19   [provider]  ipratropium (ATROVENT) 0.06 % nasal spray Place 2 sprays into the nose 4 (four) times daily. 03/04/13   Billy Fischer, MD  losartan (COZAAR) 100 MG tablet TK 1 T PO DAILY 10/14/17   [provider]  metoprolol tartrate (LOPRESSOR) 100 MG tablet Take 100mg  tablet 2 hours prior to your cardiac CT scan. 01/18/20   Freada Bergeron, MD  montelukast (SINGULAIR) 10 MG tablet Take 10 mg by mouth at bedtime.    [provider]  omeprazole (PRILOSEC) 40 MG capsule Take 40 mg by mouth daily. 01/03/20   [provider]  potassium chloride SA (K-DUR,KLOR-CON) 20 MEQ tablet Take 1 tablet (20 mEq total) by mouth daily. 09/03/17   Drenda Freeze, MD  pregabalin (LYRICA) 75 MG capsule Take 75 mg by mouth 2 (two) times daily.     [provider]  rosuvastatin (CRESTOR) 20 MG tablet Take 20 mg by mouth daily.  10/14/17   [provider]  potassium chloride (K-DUR) 10 MEQ tablet Take 1 tablet (10 mEq total) by mouth daily. 10/11/18 01/06/19  Petrucelli, Samantha R, PA-C    Allergies    Clindamycin/lincomycin, Ibuprofen, Tomato, and Vicodin [hydrocodone-acetaminophen]  Review of Systems   Review of Systems  Constitutional: Positive for chills and fever.  HENT: Positive for congestion. Negative for sore throat.   Respiratory: Positive for cough. Negative for shortness of breath.   Cardiovascular: Negative for chest pain.  Gastrointestinal: Positive for nausea and vomiting. Negative for abdominal pain and diarrhea.  Genitourinary: Negative for enuresis.  Musculoskeletal: Negative for back pain.  Skin: Negative for rash.   Neurological: Negative for headaches.  Hematological: Does not bruise/bleed easily.    Physical Exam Updated Vital Signs BP 131/86   Pulse 82   Temp 98.7 F (37.1 C) (Oral)   Resp 18   Ht 5' (1.524 m)   Wt 127 kg   SpO2 96%   BMI 54.68 kg/m   Physical Exam Vitals and nursing note reviewed.  Constitutional:      General: She is not in acute distress.    Appearance: She is not ill-appearing.  HENT:     Head: Normocephalic and atraumatic.     Nose: Congestion present.  Eyes:     Conjunctiva/sclera: Conjunctivae normal.  Cardiovascular:     Rate and Rhythm: Normal rate.  Pulmonary:     Effort: Pulmonary effort is normal. No respiratory distress.     Breath sounds: No wheezing, rhonchi or rales.  Musculoskeletal:     Comments: Patient is moving all 4 extremities at difficulty.  Skin:    General: Skin is warm and dry.  Neurological:     Mental Status: She is alert.     Comments: Patient having no difficulty with word finding.  Psychiatric:        Mood and Affect: Mood normal.     ED Results / Procedures / Treatments   Labs (all labs ordered are listed, but only abnormal results are displayed) Labs Reviewed  RESP PANEL BY RT-PCR (FLU A&B, COVID) ARPGX2 - Abnormal; Notable for the following components:      Result Value   SARS Coronavirus 2 by RT PCR POSITIVE (*)    All other components within normal limits    EKG EKG Interpretation  Date/Time:  Wednesday April 30 2020 08:17:30 EST Ventricular Rate:  75 PR Interval:  130 QRS Duration: 80 QT Interval:  424 QTC Calculation: 473 R Axis:   10 Text Interpretation: Normal sinus rhythm Low voltage QRS Borderline ECG No STEMI Confirmed by Nanda Quinton [phone]) on 04/30/2020 8:46:06 AM   Radiology No results found.  Procedures Procedures (including critical care time)  Medications Ordered in ED Medications - No data to display  ED Course  I have reviewed the triage vital signs and the nursing  notes.  Pertinent labs & imaging results that were available during my care of the patient were reviewed by me and considered in my medical decision making (see chart for details).    MDM Rules/Calculators/A&P                          Patient presents with URI-like symptoms.  She is alert, does not appear acute distress, vital signs reassuring.  Will obtain respiratory panel for further evaluation.  Respiratory panel positive for COVID-19.  Low suspicion for systemic infection as patient is nontoxic-appearing, vital signs reassuring, no obvious source infection noted on exam.  Low suspicion for pneumonia as lung sounds are clear bilaterally will defer x-ray at this time.  I have low suspicion for PE as patient denies pleuritic chest pain, shortness of breath, patient is PERC. low suspicion for strep throat as patient denies sore throat. low suspicion patient would need  hospitalized due to Covid as vital signs reassuring, patient is not in respiratory distress.  since patient has a elevated BMI with hypertension will contact infusion clinic.  Will recommend over-the-counter pain medications and have her follow-up with post Covid care.  Vital signs have remained stable, no indication for hospital admission. Patient given at home care as well strict return precautions.  Patient verbalized that they understood agreed to said plan.   Final Clinical Impression(s) / ED Diagnoses Final diagnoses:  Cough with exposure to COVID-19 virus    Rx / DC Orders ED Discharge Orders         Ordered    ondansetron (ZOFRAN) 4 MG tablet  Every 8 hours PRN        04/30/20 1030           Aron Baba 04/30/20 1613    Margette Fast, MD 05/02/20 [phone]

## 2020-04-30 NOTE — Discharge Instructions (Signed)
You have been seen here for URI like symptoms.  I recommend taking Tylenol for fever control and ibuprofen for pain control please follow dosing on the back of bottle.  I have also written you a prescription for Zofran please use as needed for nausea.  I recommend staying hydrated and if you do not an appetite, I recommend soups as this will provide you with fluids and calories.  Your Covid test is pending I recommend self quarantine until you get your results back on MyChart.  If you are Covid positive you must self quarantine for 5 days starting on symptom onset.  If symptoms improve and you are feeling better you may return back to work/school.  If at the end of 5 days you continue to have symptoms you must self quarantine for additional 5 days.     I would like you to contact "post Covid care" if you are Covid positive as they will provide you with information how to manage your Covid symptoms.  If the children need to have a Covid test performed and they have mild symptoms they can be swabbed at a pharmacy like [facility] and/or [facility]   Come back to the emergency department if you develop chest pain, shortness of breath, severe abdominal pain, uncontrolled nausea, vomiting, diarrhea.

## 2020-05-01 ENCOUNTER — Telehealth: Payer: Self-pay | Admitting: Nurse Practitioner

## 2020-05-01 NOTE — Telephone Encounter (Signed)
Pt called back states she is positive she was told to call [facility] to schedule infusions. Sent email to infusion center for screening for eligibility for treatments.

## 2020-05-01 NOTE — Telephone Encounter (Signed)
Pt called request assistance with logging in to MyChart to see COVID test results. Pt advised if symptoms do not improve or become worse after 5 day quarentine to call [facility] to be seen.

## 2020-05-08 ENCOUNTER — Ambulatory Visit: Payer: Medicaid Other | Admitting: Cardiology

## 2020-06-27 ENCOUNTER — Telehealth: Payer: Self-pay

## 2020-06-27 NOTE — Telephone Encounter (Signed)
Attempted to reach patient about next PREP start which is March 14th Attempted to reach on [phone], message redirected me to [phone] Mail box full Texted message to ask her to call me reference PREP class

## 2020-07-22 NOTE — Progress Notes (Deleted)
Cardiology Office Note:    Date:  07/22/2020   ID:  Marigny Borre, DOB 12-10-1970, MRN [id number]  PCP:  Welch, Victoria C, [place]  Cardiologist:  Freada Bergeron, MD  Advanced Practice Provider:  No care team member to display Electrophysiologist:  None    Referring MD: Leone Brand, PA-C     History of Present Illness:    Holden Woloszyn is a 50 y.o. female with a hx of HTN, HLD, fibromyalgia and obesity who returns to clinic for follow-up.  The patient was recently seen in the [place] ER on 01/08/20 for episode of sharp, left-sided chest pain.  ECG without ischemic changes. Trop-I <0.01, CK-MB 2, D-dimer 285. Her pain subsided without intervention, however, given her risk factors, she was recommended for stress testing.Unfortantely, she had to leave prior to testing as she had her daughter with her and did not have childcare available. She re-presented to [facility] ER on 01/09/20 with recurrence of her left sided and central chest pain. ECG here demonstrated sinus rhythm with low voltage in the precordial leads, prolonged Qtc but no evidence of ischemia.  Labs notable for trop 3, BNP 25.4. She was given nitro for her chest pain, but it did not help. Per ED notes, pain was reproducible with palpation with low suspicion of ACS. Given negative work-up and reassuring examination she was discharged home.  We saw her in clinic on 01/17/20 for her chest pain. We recommended coronary CTA which was negative for any obstructive CAD. Ca score 0. TTE with normal LVEF 55-60%, mild to moderate MR, normal RV with normal PASP. She missed several follow-up appointments. Today,     Past Medical History:  Diagnosis Date  . Anemia   . Asthma   . Atypical chest pain 07/28/2018  . Back pain, chronic   . Fibromyalgia   . Hypertension   . Mitral regurgitation   . Morbid obesity ([place])   . Neurogenic bladder   . Pain management contract signed   . Palpitations  07/28/2018  . Panic attacks   . Prolonged Q-T interval on ECG   . Reflux     Past Surgical History:  Procedure Laterality Date  . BACK SURGERY    . bladder stimulator    . CARPAL TUNNEL RELEASE    . TONSILLECTOMY    . URETERAL STENT PLACEMENT  2008    Current Medications: No outpatient medications have been marked as taking for the 07/23/20 encounter (Appointment) with Freada Bergeron, MD.     Allergies:   Clindamycin/lincomycin, Ibuprofen, Tomato, and Vicodin [hydrocodone-acetaminophen]   Social History   Socioeconomic History  . Marital status: Single    Spouse name: Not on file  . Number of children: 4  . Years of education: Not on file  . Highest education level: Not on file  Occupational History  . Not on file  Tobacco Use  . Smoking status: Former Research scientist (life sciences)  . Smokeless tobacco: Never Used  Vaping Use  . Vaping Use: Never used  Substance and Sexual Activity  . Alcohol use: No  . Drug use: No  . Sexual activity: Not on file  Other Topics Concern  . Not on file  Social History Narrative  . Not on file   Social Determinants of Health   Financial Resource Strain: Not on file  Food Insecurity: Not on file  Transportation Needs: Not on file  Physical Activity: Not on file  Stress: Not on file  Social Connections: Not on file     Family History: The patient's ***family history includes Heart attack in her father and mother; Heart disease in her father and sister.  ROS:   Please see the history of present illness.    *** All other systems reviewed and are negative.  EKGs/Labs/Other Studies Reviewed:    The following studies were reviewed today: CTA Feb 22, 2020: FINDINGS: Image quality: Average.  Noise artifact is: Moderate signal to noise artifact is present due to obesity (BMI 55).  Coronary Arteries:  Normal coronary origin.  Right dominance.  Left main: The left main is a large caliber vessel with a normal take off from the left coronary cusp  that bifurcates to form a left anterior descending artery and a left circumflex artery. There is no plaque or stenosis.  Left anterior descending artery: The LAD is patent without evidence of plaque or stenosis. The LAD gives off 3 patent diagonal branches. There is limited evaluation of the distal segments due to obesity artifact but these appear normal.  Left circumflex artery: The LCX is non-dominant and patent with no evidence of plaque or stenosis. Limited evaluation of the distal LCX due to obesity artifact.  Right coronary artery: The RCA is dominant with normal take off from the right coronary cusp. There is no evidence of plaque or stenosis. The RCA terminates as a PDA and right posterolateral branch without evidence of plaque or stenosis. Again, the distal segments appear normal but there is obesity artifact.  Right Atrium: Right atrial size is within normal limits.  Right Ventricle: The right ventricular cavity is within normal limits.  Left Atrium: Left atrial size is normal in size with no left atrial appendage filling defect.  Left Ventricle: The ventricular cavity size is within normal limits. There are no stigmata of prior infarction. There is no abnormal filling defect.  Pulmonary arteries: Normal in size without proximal filling defect.  Pulmonary veins: Normal pulmonary venous drainage.  Pericardium: Normal thickness with no significant effusion or calcium present.  Cardiac valves: The aortic valve is trileaflet without significant calcification. The mitral valve is normal structure without significant calcification.  Aorta: Normal caliber with no significant disease.  Extra-cardiac findings: See attached radiology report for non-cardiac structures.  IMPRESSION: 1. Coronary calcium score of 0.  2. Normal coronary origin with right dominance.  3. There is severe signal to noise artifact related to obesity (BMI 55) but the coronary  arteries appear to be normal without evidence of plaque or stenosis.  4. Limited evaluation of the distal coronary segments.  TTE 01/21/20: IMPRESSIONS    1. Poor acoustic windows limit study.  2. Global longitudinal strain is -19.2%. Left ventricular ejection  fraction, by estimation, is 55 to 60%. The left ventricle has normal  function. The left ventricle has no regional wall motion abnormalities.  The left ventricular internal cavity size was  mildly dilated. Left ventricular diastolic parameters were normal.  3. Right ventricular systolic function is normal. The right ventricular  size is normal. There is normal pulmonary artery systolic pressure.  4. MR jet is eccentric, directed posterior into LA. Mild to moderate  mitral valve regurgitation.  5. Mild aortic valve sclerosis is present, with no evidence of aortic  valve stenosis.  6. The inferior vena cava is normal in size with greater than 50%  respiratory variability, suggesting right atrial pressure of 3 mmHg.   EKG:  EKG is *** ordered today.  The ekg ordered today demonstrates ***  Recent  Labs: 01/09/2020: B Natriuretic Peptide 25.4; Hemoglobin 10.5; Platelets 347 01/21/2020: BUN 10; Creatinine, Ser 1.15; Potassium 4.3; Sodium 143  Recent Lipid Panel No results found for: CHOL, TRIG, HDL, CHOLHDL, VLDL, LDLCALC, LDLDIRECT   Risk Assessment/Calculations:   {Does this patient have ATRIAL FIBRILLATION?[phone]}   Physical Exam:    VS:  There were no vitals taken for this visit.    Wt Readings from Last 3 Encounters:  04/30/20 280 lb (127 kg)  01/17/20 285 lb (129.3 kg)  01/09/20 282 lb (127.9 kg)     GEN: *** Well nourished, well developed in no acute distress HEENT: Normal NECK: No JVD; No carotid bruits LYMPHATICS: No lymphadenopathy CARDIAC: ***RRR, no murmurs, rubs, gallops RESPIRATORY:  Clear to auscultation without rales, wheezing or rhonchi  ABDOMEN: Soft, non-tender,  non-distended MUSCULOSKELETAL:  No edema; No deformity  SKIN: Warm and dry NEUROLOGIC:  Alert and oriented x 3 PSYCHIATRIC:  Normal affect   ASSESSMENT:    No diagnosis found. PLAN:    In order of problems listed above:  #Dyspnea on Exertion: Work-up reassuring with normal CTA, calcium score 0 and normal LVEF with no siginficant valve disease. Likely related to underlying asthma and obesity.  -Follow-up with Pulm as scheduled  #Non-Cardiac Chest Pain: CTA without obstructive disease with calcium score 0. TTE with normal LVEF, no significant valve disease. -Continue to monitor  #Hypertension: Well controlled today. Managed by PCP. -Cotinue atenolol-chlorthalidone -Continue hydralazine 100mg  BID -Continue losartan 100mg  daily  #Hyperlipidemia -On crestor 20mg  daily  #Morbid Obesity: BMI 56.  -Extensive counseling regarding the importance of weight loss as likely contributing to the pain the joint pain and MSK pain she is having. She is working on diet. Exercise is limited due to SOB but she is trying to be more active. Will continue to encourage lifestyle changes.    {Are you ordering a CV Procedure (e.g. stress test, cath, DCCV, TEE, etc)?   Press F2        :[id number]}    Medication Adjustments/Labs and Tests Ordered: Current medicines are reviewed at length with the patient today.  Concerns regarding medicines are outlined above.  No orders of the defined types were placed in this encounter.  No orders of the defined types were placed in this encounter.   There are no Patient Instructions on file for this visit.   Signed, Freada Bergeron, MD  07/22/2020 8:14 PM    [facility]

## 2020-07-23 ENCOUNTER — Ambulatory Visit: Payer: Medicaid Other | Admitting: Cardiology

## 2020-07-24 ENCOUNTER — Other Ambulatory Visit: Payer: Self-pay

## 2020-07-24 ENCOUNTER — Encounter (HOSPITAL_BASED_OUTPATIENT_CLINIC_OR_DEPARTMENT_OTHER): Payer: Self-pay

## 2020-07-24 ENCOUNTER — Emergency Department (HOSPITAL_BASED_OUTPATIENT_CLINIC_OR_DEPARTMENT_OTHER): Payer: Medicaid Other

## 2020-07-24 ENCOUNTER — Emergency Department (HOSPITAL_BASED_OUTPATIENT_CLINIC_OR_DEPARTMENT_OTHER)
Admission: EM | Admit: 2020-07-24 | Discharge: 2020-07-24 | Disposition: A | Discharge status: Home / Self Care | Payer: Medicaid Other | Attending: Emergency Medicine | Admitting: Emergency Medicine

## 2020-07-24 DIAGNOSIS — M25551 Pain in right hip: Secondary | ICD-10-CM | POA: Insufficient documentation

## 2020-07-24 DIAGNOSIS — M5430 Sciatica, unspecified side: Secondary | ICD-10-CM

## 2020-07-24 DIAGNOSIS — Z79899 Other long term (current) drug therapy: Secondary | ICD-10-CM | POA: Diagnosis not present

## 2020-07-24 DIAGNOSIS — Y92007 Garden or yard of unspecified non-institutional (private) residence as the place of occurrence of the external cause: Secondary | ICD-10-CM | POA: Insufficient documentation

## 2020-07-24 DIAGNOSIS — J45909 Unspecified asthma, uncomplicated: Secondary | ICD-10-CM | POA: Diagnosis not present

## 2020-07-24 DIAGNOSIS — Z87891 Personal history of nicotine dependence: Secondary | ICD-10-CM | POA: Diagnosis not present

## 2020-07-24 DIAGNOSIS — W010XXA Fall on same level from slipping, tripping and stumbling without subsequent striking against object, initial encounter: Secondary | ICD-10-CM | POA: Diagnosis not present

## 2020-07-24 DIAGNOSIS — Z7951 Long term (current) use of inhaled steroids: Secondary | ICD-10-CM | POA: Diagnosis not present

## 2020-07-24 DIAGNOSIS — M545 Low back pain, unspecified: Secondary | ICD-10-CM | POA: Diagnosis present

## 2020-07-24 DIAGNOSIS — I10 Essential (primary) hypertension: Secondary | ICD-10-CM | POA: Insufficient documentation

## 2020-07-24 DIAGNOSIS — W19XXXA Unspecified fall, initial encounter: Secondary | ICD-10-CM

## 2020-07-24 MED ORDER — PREDNISONE 50 MG PO TABS
60.0000 mg | ORAL_TABLET | Freq: Once | ORAL | Status: AC
  Administered 2020-07-24: 60 mg via ORAL
  Filled 2020-07-24: qty 1

## 2020-07-24 MED ORDER — PREDNISONE 10 MG (21) PO TBPK: ORAL_TABLET | ORAL | 0 refills | Status: AC

## 2020-07-24 NOTE — ED Triage Notes (Signed)
Pt tripped in the yard and fell day before yesterday. Today pt is hurting in lower back and R hip. Pt ambulatory into triage room without difficulty.

## 2020-07-24 NOTE — ED Notes (Signed)
Pt. Reports she fell on Tues and injured the R lower side of her back.

## 2020-07-24 NOTE — ED Provider Notes (Signed)
[facility] DEPARTMENT Provider Note   CSN: [id number] Arrival date & time: 07/24/20  1126     History Chief Complaint  Patient presents with  . Fall  . Back Pain    Felicia Molina is a 50 y.o. female.  Pt presents to the ED today with low back pain and right hip pain.  Pt has chronic back pain and takes oxycodone 15 mg which was last filled on 3/4 (#120).  Pt said her knees occasionally give out and she said this happened 2 days ago.  She fell onto her low back and her right hip.  She has a stimulator in her back and is worried she injured the stimulator.  She has been able to ambulate without difficulty.  She has been taking her pain meds which help, but the pain is back as the pain medication wears off.          Past Medical History:  Diagnosis Date  . Anemia   . Asthma   . Atypical chest pain 07/28/2018  . Back pain, chronic   . Fibromyalgia   . Hypertension   . Mitral regurgitation   . Morbid obesity ([place])   . Neurogenic bladder   . Pain management contract signed   . Palpitations 07/28/2018  . Panic attacks   . Prolonged Q-T interval on ECG   . Reflux     Patient Active Problem List   Diagnosis Date Noted  . Atypical chest pain 07/28/2018  . Palpitations 07/28/2018  . Right wrist pain 11/08/2017  . Asthma 10/14/2017  . Depression 10/14/2017  . GERD (gastroesophageal reflux disease) 10/14/2017  . Fibroid 09/09/2017  . Morbid obesity with BMI of 50.0-59.9, adult ([place]) 08/05/2016  . Rheumatoid arthritis ([place]) 08/05/2016  . Chronic bilateral low back pain without sciatica 05/13/2015  . Plantar fasciitis, bilateral 07/19/2012  . Metatarsalgia of both feet 07/19/2012  . Acute respiratory failure ([place]) 05/07/2012  . Hypertension   . CAP (community acquired pneumonia) 05/06/2012    Past Surgical History:  Procedure Laterality Date  . BACK SURGERY    . bladder stimulator    . CARPAL TUNNEL RELEASE    . TONSILLECTOMY    . URETERAL STENT  PLACEMENT  2008     OB History    Gravida  6   Para  4   Term  4   Preterm  0   AB  2   Living  4     SAB  2   IAB  0   Ectopic  0   Multiple  0   Live Births  4           Family History  Problem Relation Age of Onset  . Heart attack Mother   . Heart disease Father   . Heart attack Father   . Heart disease Sister     Social History   Tobacco Use  . Smoking status: Former Research scientist (life sciences)  . Smokeless tobacco: Never Used  Vaping Use  . Vaping Use: Never used  Substance Use Topics  . Alcohol use: No  . Drug use: No    Home Medications Prior to Admission medications   Medication Sig Start Date End Date Taking? Authorizing Provider  predniSONE (STERAPRED UNI-PAK 21 TAB) 10 MG (21) TBPK tablet Take 6 tabs for 2 days, then 5 for 2 days, then 4 for 2 days, then 3 for 2 days, 2 for 2 days, then 1 for 2 days 07/24/20  Yes  Isla Pence, MD  albuterol (VENTOLIN HFA) 108 (90 Base) MCG/ACT inhaler Inhale 2 puffs into the lungs every 4 (four) hours as needed for wheezing or shortness of breath. 04/07/19   Noemi Chapel, MD  atenolol-chlorthalidone (TENORETIC) 100-25 MG tablet Take 1 tablet by mouth daily.  10/14/17   [provider]  budesonide-formoterol (SYMBICORT) 160-4.5 MCG/ACT inhaler Inhale 2 puffs into the lungs 2 (two) times daily.    [provider]  buPROPion (WELLBUTRIN SR) 150 MG 12 hr tablet Take 150 mg by mouth 2 (two) times daily.  10/14/17   [provider]  cetirizine (ZYRTEC) 10 MG tablet Take 10 mg by mouth daily.  10/14/17   [provider]  cyclobenzaprine (FLEXERIL) 10 MG tablet Take 10 mg by mouth 3 (three) times daily as needed. Takes for muscle spasms    [provider]  diclofenac sodium (VOLTAREN) 1 % GEL Apply 2 g topically 4 (four) times daily.    [provider]  fluticasone (FLONASE) 50 MCG/ACT nasal spray 2 SPRAYS INTO EACH NOSTRIL DAILY 10/14/17   [provider]  furosemide (LASIX)  40 MG tablet Take 1 tablet (40 mg total) by mouth daily. Patient taking differently: Take 20 mg by mouth daily.  05/09/12   Verlee Monte, MD  hydrALAZINE (APRESOLINE) 100 MG tablet Take 100 mg by mouth 2 (two) times daily. 12/08/19   [provider]  ipratropium (ATROVENT) 0.06 % nasal spray Place 2 sprays into the nose 4 (four) times daily. 03/04/13   Billy Fischer, MD  losartan (COZAAR) 100 MG tablet TK 1 T PO DAILY 10/14/17   [provider]  metoprolol tartrate (LOPRESSOR) 100 MG tablet Take 100mg  tablet 2 hours prior to your cardiac CT scan. 01/18/20   Freada Bergeron, MD  montelukast (SINGULAIR) 10 MG tablet Take 10 mg by mouth at bedtime.    [provider]  omeprazole (PRILOSEC) 40 MG capsule Take 40 mg by mouth daily. 01/03/20   [provider]  ondansetron (ZOFRAN) 4 MG tablet Take 1 tablet (4 mg total) by mouth every 8 (eight) hours as needed for nausea or vomiting. 04/30/20   Marcello Fennel, PA-C  potassium chloride SA (K-DUR,KLOR-CON) 20 MEQ tablet Take 1 tablet (20 mEq total) by mouth daily. 09/03/17   Drenda Freeze, MD  pregabalin (LYRICA) 75 MG capsule Take 75 mg by mouth 2 (two) times daily.     [provider]  rosuvastatin (CRESTOR) 20 MG tablet Take 20 mg by mouth daily.  10/14/17   [provider]  potassium chloride (K-DUR) 10 MEQ tablet Take 1 tablet (10 mEq total) by mouth daily. 10/11/18 01/06/19  Petrucelli, Samantha R, PA-C    Allergies    Clindamycin/lincomycin, Ibuprofen, Tomato, and Vicodin [hydrocodone-acetaminophen]  Review of Systems   Review of Systems  Musculoskeletal: Positive for back pain.       Right hip pain  All other systems reviewed and are negative.   Physical Exam Updated Vital Signs BP 121/76 (BP Location: Left Wrist)   Pulse 75   Temp 98.3 F (36.8 C) (Oral)   Resp 20   Ht 5' (1.524 m)   Wt 118.8 kg   LMP 07/08/2020   SpO2 100%   BMI 51.17 kg/m   Physical Exam Vitals and  nursing note reviewed.  Constitutional:      Appearance: Normal appearance.  HENT:     Head: Normocephalic and atraumatic.     Right Ear: External ear normal.  Left Ear: External ear normal.     Nose: Nose normal.     Mouth/Throat:     Mouth: Mucous membranes are moist.     Pharynx: Oropharynx is clear.  Eyes:     Extraocular Movements: Extraocular movements intact.     Conjunctiva/sclera: Conjunctivae normal.     Pupils: Pupils are equal, round, and reactive to light.  Cardiovascular:     Rate and Rhythm: Normal rate and regular rhythm.     Pulses: Normal pulses.     Heart sounds: Normal heart sounds.  Pulmonary:     Effort: Pulmonary effort is normal.     Breath sounds: Normal breath sounds.  Abdominal:     General: Abdomen is flat. Bowel sounds are normal.     Palpations: Abdomen is soft.  Musculoskeletal:     Cervical back: Normal range of motion and neck supple.       Back:  Skin:    General: Skin is warm.     Capillary Refill: Capillary refill takes less than 2 seconds.  Neurological:     General: No focal deficit present.     Mental Status: She is alert and oriented to person, place, and time.  Psychiatric:        Mood and Affect: Mood normal.        Behavior: Behavior normal.     ED Results / Procedures / Treatments   Labs (all labs ordered are listed, but only abnormal results are displayed) Labs Reviewed - No data to display  EKG None  Radiology DG Lumbar Spine Complete  Result Date: 07/24/2020 CLINICAL DATA:  Pain following fall EXAM: LUMBAR SPINE - COMPLETE 4+ VIEW COMPARISON:  March 12, 2016 FINDINGS: Frontal, lateral, spot lumbosacral lateral, and bilateral oblique views were obtained. There are 5 non-rib-bearing lumbar type vertebral bodies. There is posterior screw and plate fixation at L4, L5, S1 with disc spacers at L4-5 and L5-S1. Screw tips are in the respective vertebral bodies. There is no evident fracture. There is 6 mm of  anterolisthesis of L3 on L4, not present previously. There is no other appreciable spondylolisthesis. There is moderate disc space narrowing at L3-4. Disc space narrowing with disc spacers present at L4-5 and L5-S1 noted. Stimulator leads are present at the anterior sacral levels on the right and left sides. Stable facet osteoarthritic change at L4-5 and L5-S1 bilaterally. IMPRESSION: Postoperative changes from L4-S1 with support hardware intact. Disc spacers at L4-5 and L5-S1 intact. There is now 6 mm of anterolisthesis of L3 on L4, not present previously with moderate disc space narrowing at L3-4. Suspect spondylosis as etiology for these findings. No fracture evident. Lower lumbar osteoarthritic change again noted. Stimulator leads overlying anterior sacrum. Electronically Signed   By: Lowella Grip III M.D.   On: 07/24/2020 13:01   DG Hip Unilat W or Wo Pelvis 2-3 Views Right  Result Date: 07/24/2020 CLINICAL DATA:  Pain following fall EXAM: DG HIP (WITH OR WITHOUT PELVIS) 2-3V RIGHT COMPARISON:  None. FINDINGS: Frontal pelvis as well as frontal and lateral right hip images were obtained. No fracture or dislocation. Joint spaces appear normal. No erosive change. Stimulator leads overlie the left and right sacrum. Postoperative change in the lower lumbar and upper sacral regions. IMPRESSION: No acute fracture or dislocation. Hip joints appear unremarkable bilaterally. Postoperative changes noted. Electronically Signed   By: Lowella Grip III M.D.   On: 07/24/2020 13:02    Procedures Procedures   Medications Ordered in ED Medications  predniSONE (DELTASONE) tablet 60 mg (has no administration in time range)    ED Course  I have reviewed the triage vital signs and the nursing notes.  Pertinent labs & imaging results that were available during my care of the patient were reviewed by me and considered in my medical decision making (see chart for details).    MDM Rules/Calculators/A&P                           Hardware intact.  No evidence of fx.  Pt likely has sciatica from her fall.  She will be started on steroids.  Return if worse.  F/u with pcp.  Final Clinical Impression(s) / ED Diagnoses Final diagnoses:  Fall, initial encounter  Acute sciatica    Rx / DC Orders ED Discharge Orders         Ordered    predniSONE (STERAPRED UNI-PAK 21 TAB) 10 MG (21) TBPK tablet        07/24/20 1308           Isla Pence, MD 07/24/20 1309

## 2020-08-19 ENCOUNTER — Telehealth: Payer: Self-pay | Admitting: Cardiology

## 2020-08-19 NOTE — Telephone Encounter (Signed)
Attempted to call the pt back and she did not answer and VM is full.  Pt saw Dr. Johney Frame back in September 2021, where a Coronary CT and echo were ordered.  Pt had both test done and both came back within acceptable limits, as indicated below. Will await pt call back.   Freada Bergeron, MD  02/01/2020 12:26 PM EDT      Coronary CTA looked good without evidence of any blockages in her arteries. Thankfully, he chest pain and SOB do not appear to be due to cardiac issues. Would recommend continued follow-up with her pulmonary doctors for her asthma and her primary care for the left sided breast pain.      Freada Bergeron, MD  01/21/2020 5:24 PM EDT      Echo results reviewed. Normal LVEF 55-60%. Mild to moderate mitral regurgitation which we will monitor with repeat echos every 2-3 years to ensure it does not progress. Nothing to do about the mitral regurgitation now other than ensure that her blood pressure is well controlled.   Thank you so much!!

## 2020-08-19 NOTE — Telephone Encounter (Signed)
Pt c/o of Chest Pain: STAT if CP now or developed within 24 hours  1. Are you having CP right now? No  2. Are you experiencing any other symptoms (ex. SOB, nausea, vomiting, sweating)? No  3. How long have you been experiencing CP? About a week, Pt states this only happens when she gets mad and her head hurts  4. Is your CP continuous or coming and going? Coming and going  5. Have you taken Nitroglycerin? No, pt doesn't have Nitro ?

## 2020-08-29 NOTE — Telephone Encounter (Signed)
Awaiting pt call back to offer her an appt with Dr. Johney Frame, held for next Tuesday 5/10 at 1000.  Slot is being held for the pt, when she returns a call back.

## 2020-09-03 ENCOUNTER — Emergency Department (HOSPITAL_BASED_OUTPATIENT_CLINIC_OR_DEPARTMENT_OTHER)
Admission: EM | Admit: 2020-09-03 | Discharge: 2020-09-03 | Disposition: A | Discharge status: Home / Self Care | Payer: Medicaid Other | Attending: Emergency Medicine | Admitting: Emergency Medicine

## 2020-09-03 ENCOUNTER — Emergency Department (HOSPITAL_BASED_OUTPATIENT_CLINIC_OR_DEPARTMENT_OTHER): Payer: Medicaid Other

## 2020-09-03 ENCOUNTER — Other Ambulatory Visit: Payer: Self-pay

## 2020-09-03 ENCOUNTER — Encounter (HOSPITAL_BASED_OUTPATIENT_CLINIC_OR_DEPARTMENT_OTHER): Payer: Self-pay

## 2020-09-03 DIAGNOSIS — Z79899 Other long term (current) drug therapy: Secondary | ICD-10-CM | POA: Insufficient documentation

## 2020-09-03 DIAGNOSIS — R079 Chest pain, unspecified: Secondary | ICD-10-CM | POA: Diagnosis not present

## 2020-09-03 DIAGNOSIS — R519 Headache, unspecified: Secondary | ICD-10-CM | POA: Insufficient documentation

## 2020-09-03 DIAGNOSIS — Z20822 Contact with and (suspected) exposure to covid-19: Secondary | ICD-10-CM | POA: Insufficient documentation

## 2020-09-03 DIAGNOSIS — H53149 Visual discomfort, unspecified: Secondary | ICD-10-CM | POA: Insufficient documentation

## 2020-09-03 DIAGNOSIS — Z87891 Personal history of nicotine dependence: Secondary | ICD-10-CM | POA: Insufficient documentation

## 2020-09-03 DIAGNOSIS — Z7951 Long term (current) use of inhaled steroids: Secondary | ICD-10-CM | POA: Diagnosis not present

## 2020-09-03 DIAGNOSIS — R110 Nausea: Secondary | ICD-10-CM | POA: Diagnosis not present

## 2020-09-03 DIAGNOSIS — J45909 Unspecified asthma, uncomplicated: Secondary | ICD-10-CM | POA: Diagnosis not present

## 2020-09-03 DIAGNOSIS — I10 Essential (primary) hypertension: Secondary | ICD-10-CM | POA: Diagnosis not present

## 2020-09-03 LAB — BASIC METABOLIC PANEL
Anion gap: 11 (ref 5–15)
BUN: 20 mg/dL (ref 6–20)
CO2: 26 mmol/L (ref 22–32)
Calcium: 9.1 mg/dL (ref 8.9–10.3)
Chloride: 100 mmol/L (ref 98–111)
Creatinine, Ser: 1.25 mg/dL — ABNORMAL HIGH (ref 0.44–1.00)
GFR, Estimated: 53 mL/min — ABNORMAL LOW (ref >60)
Glucose, Bld: 90 mg/dL (ref 70–99)
Potassium: 2.9 mmol/L — ABNORMAL LOW (ref 3.5–5.1)
Sodium: 137 mmol/L (ref 135–145)

## 2020-09-03 LAB — CBC
HCT: 39.1 % (ref 36.0–46.0)
Hemoglobin: 12.9 g/dL (ref 12.0–15.0)
MCH: 29.9 pg (ref 26.0–34.0)
MCHC: 33 g/dL (ref 30.0–36.0)
MCV: 90.5 fL (ref 80.0–100.0)
Platelets: 412 10*3/uL — ABNORMAL HIGH (ref 150–400)
RBC: 4.32 MIL/uL (ref 3.87–5.11)
RDW: 14.1 % (ref 11.5–15.5)
WBC: 13.9 10*3/uL — ABNORMAL HIGH (ref 4.0–10.5)
nRBC: 0 % (ref 0.0–0.2)

## 2020-09-03 LAB — SARS CORONAVIRUS 2 (TAT 6-24 HRS): SARS Coronavirus 2: NEGATIVE

## 2020-09-03 LAB — TROPONIN I (HIGH SENSITIVITY)
Troponin I (High Sensitivity): 3 ng/L (ref <18)
Troponin I (High Sensitivity): 3 ng/L (ref <18)

## 2020-09-03 LAB — ACETAMINOPHEN LEVEL: Acetaminophen (Tylenol), Serum: <10 ug/mL — ABNORMAL LOW (ref 10–30)

## 2020-09-03 LAB — PREGNANCY, URINE: Preg Test, Ur: NEGATIVE

## 2020-09-03 MED ORDER — LACTATED RINGERS IV BOLUS
1000.0000 mL | Freq: Once | INTRAVENOUS | Status: AC
  Administered 2020-09-03: 1000 mL via INTRAVENOUS

## 2020-09-03 MED ORDER — DIPHENHYDRAMINE HCL 50 MG/ML IJ SOLN
25.0000 mg | Freq: Once | INTRAMUSCULAR | Status: AC
  Administered 2020-09-03: 25 mg via INTRAVENOUS
  Filled 2020-09-03: qty 1

## 2020-09-03 MED ORDER — METOCLOPRAMIDE HCL 5 MG/ML IJ SOLN
10.0000 mg | Freq: Once | INTRAMUSCULAR | Status: AC
  Administered 2020-09-03: 10 mg via INTRAVENOUS
  Filled 2020-09-03: qty 2

## 2020-09-03 NOTE — ED Triage Notes (Addendum)
Pt c/o CP, SOB x 2 months-states she was seen by cards last month-c/o HA, cough x 3 weeks-DOE, diaphoresis noted-NAD after seated in triage-O2 sat 100%

## 2020-09-03 NOTE — ED Provider Notes (Signed)
Emergency Medicine Provider Triage Evaluation Note  Felicia Molina , a 50 y.o. female  was evaluated in triage.  Pt complains of headache for last 3 days. Chest pain beginning yesterday. Accompanied by shortness of breath and cough.   Review of Systems  Positive: Headache, shortness of breath, cough Negative: Neuro deficits, vision deficits, N/V/D, abd pain, neck stiffness  Physical Exam  BP 118/65 (BP Location: Right Arm)   Pulse 72   Temp 98.2 F (36.8 C) (Oral)   Resp 20   Ht 5' (1.524 m)   Wt 127.5 kg   LMP 08/02/2020   SpO2 100%   BMI 54.88 kg/m  Gen:   Awake, no distress   Resp:  Normal effort, speaks in full sentences without difficulty MSK:   Moves extremities without difficulty  Other:    Medical Decision Making  Medically screening exam initiated at 11:48 AM.  Appropriate orders placed.  Felicia Molina was informed that the remainder of the evaluation will be completed by another provider, this initial triage assessment does not replace that evaluation, and the importance of remaining in the ED until their evaluation is complete.     Lorayne Bender, PA-C 09/03/20 1154    Breck Coons, MD 09/04/20 [phone]

## 2020-09-03 NOTE — Telephone Encounter (Signed)
The patient called in with active CP.  Of note, it was very difficult to understand her as she would not take the call off speaker phone.  She has had a terrible migraine for about 2 weeks that is so bad her chest is starting to hurt.  She reports she has taken 4 bottles of Tylenol in 2 days. She could not answer questions about dosage given poor phone connection.  The patient had cCT October 2021 with calcium score 0.  Given longterm migraine and amount of ingested Tylenol, instructed the patient to proceed to the ER and reiterated to her to have someone else drive. She was grateful for assistance and agrees with plan.

## 2020-09-03 NOTE — Discharge Instructions (Addendum)
You can take 600 mg of ibuprofen every 6 hours, you can take 1000 mg of Tylenol every 6 hours, you can alternate these every 3 or you can take them together.  

## 2020-09-03 NOTE — Telephone Encounter (Signed)
Pt c/o of Chest Pain: STAT if CP now or developed within 24 hours  1. Are you having CP right now? yes  2. Are you experiencing any other symptoms (ex. SOB, nausea, vomiting, sweating)? Headache, nausea  3. How long have you been experiencing CP? Since she called in 4/26  4. Is your CP continuous or coming and going? Comes and goes  5. Have you taken Nitroglycerin? No, does not have it   Patient states she is having chest pain and a very bad headache. She states she takes tylenol for it, but does not have nitroglycerin. She states her phone was turned off and was unable to get calls about getting an appointment, so she was unaware a slot was held for her. ?

## 2020-09-03 NOTE — ED Provider Notes (Signed)
[facility] Provider Note   CSN: [id number] Arrival date & time: 09/03/20  1113     History Chief Complaint  Patient presents with  . Chest Pain    [facility] is a 50 y.o. female.  Patient has a headache.  She describes it as diffuse.  Aching throbbing.  Does not radiate.  She believes this is making her chest pain worse.  She says she has some photosensitivity.  She has some nausea.  Her chest pain she believes is caused by the headache.  She has had no trauma no infections but she does feel some body aches chills.  No fevers.  No productive cough vomiting.        Past Medical History:  Diagnosis Date  . Anemia   . Asthma   . Atypical chest pain 07/28/2018  . Back pain, chronic   . Fibromyalgia   . Hypertension   . Mitral regurgitation   . Morbid obesity ([facility])   . Neurogenic bladder   . Pain management contract signed   . Palpitations 07/28/2018  . Panic attacks   . Prolonged Q-T interval on ECG   . Reflux     Patient Active Problem List   Diagnosis Date Noted  . Atypical chest pain 07/28/2018  . Palpitations 07/28/2018  . Right wrist pain 11/08/2017  . Asthma 10/14/2017  . Depression 10/14/2017  . GERD (gastroesophageal reflux disease) 10/14/2017  . Fibroid 09/09/2017  . Morbid obesity with BMI of 50.0-59.9, adult ([place]) 08/05/2016  . Rheumatoid arthritis ([place]) 08/05/2016  . Chronic bilateral low back pain without sciatica 05/13/2015  . Plantar fasciitis, bilateral 07/19/2012  . Metatarsalgia of both feet 07/19/2012  . Acute respiratory failure ([place]) 05/07/2012  . Hypertension   . CAP (community acquired pneumonia) 05/06/2012    Past Surgical History:  Procedure Laterality Date  . BACK SURGERY    . bladder stimulator    . CARPAL TUNNEL RELEASE    . TONSILLECTOMY    . URETERAL STENT PLACEMENT  2008     OB History    Gravida  6   Para  4   Term  4   Preterm  0   AB  2   Living  4     SAB  2   IAB  0    Ectopic  0   Multiple  0   Live Births  4           Family History  Problem Relation Age of Onset  . Heart attack Mother   . Heart disease Father   . Heart attack Father   . Heart disease Sister     Social History   Tobacco Use  . Smoking status: Former Research scientist (life sciences)  . Smokeless tobacco: Never Used  Vaping Use  . Vaping Use: Never used  Substance Use Topics  . Alcohol use: No  . Drug use: No    Home Medications Prior to Admission medications   Medication Sig Start Date End Date Taking? Authorizing Provider  albuterol (VENTOLIN HFA) 108 (90 Base) MCG/ACT inhaler Inhale 2 puffs into the lungs every 4 (four) hours as needed for wheezing or shortness of breath. 04/07/19   Noemi Chapel, MD  atenolol-chlorthalidone (TENORETIC) 100-25 MG tablet Take 1 tablet by mouth daily.  10/14/17   [provider]  budesonide-formoterol (SYMBICORT) 160-4.5 MCG/ACT inhaler Inhale 2 puffs into the lungs 2 (two) times daily.    [provider]  buPROPion [facility] SR) 150  MG 12 hr tablet Take 150 mg by mouth 2 (two) times daily.  10/14/17   [provider]  cetirizine (ZYRTEC) 10 MG tablet Take 10 mg by mouth daily.  10/14/17   [provider]  cyclobenzaprine (FLEXERIL) 10 MG tablet Take 10 mg by mouth 3 (three) times daily as needed. Takes for muscle spasms    [provider]  diclofenac sodium (VOLTAREN) 1 % GEL Apply 2 g topically 4 (four) times daily.    [provider]  fluticasone (FLONASE) 50 MCG/ACT nasal spray 2 SPRAYS INTO EACH NOSTRIL DAILY 10/14/17   [provider]  furosemide (LASIX) 40 MG tablet Take 1 tablet (40 mg total) by mouth daily. Patient taking differently: Take 20 mg by mouth daily.  05/09/12   Verlee Monte, MD  hydrALAZINE (APRESOLINE) 100 MG tablet Take 100 mg by mouth 2 (two) times daily. 12/08/19   [provider]  ipratropium (ATROVENT) 0.06 % nasal spray Place 2 sprays into the nose 4 (four) times  daily. 03/04/13   Billy Fischer, MD  losartan (COZAAR) 100 MG tablet TK 1 T PO DAILY 10/14/17   [provider]  metoprolol tartrate (LOPRESSOR) 100 MG tablet Take 100mg  tablet 2 hours prior to your cardiac CT scan. 01/18/20   Freada Bergeron, MD  montelukast (SINGULAIR) 10 MG tablet Take 10 mg by mouth at bedtime.    [provider]  omeprazole (PRILOSEC) 40 MG capsule Take 40 mg by mouth daily. 01/03/20   [provider]  ondansetron (ZOFRAN) 4 MG tablet Take 1 tablet (4 mg total) by mouth every 8 (eight) hours as needed for nausea or vomiting. 04/30/20   Marcello Fennel, PA-C  potassium chloride SA (K-DUR,KLOR-CON) 20 MEQ tablet Take 1 tablet (20 mEq total) by mouth daily. 09/03/17   Drenda Freeze, MD  predniSONE (STERAPRED UNI-PAK 21 TAB) 10 MG (21) TBPK tablet Take 6 tabs for 2 days, then 5 for 2 days, then 4 for 2 days, then 3 for 2 days, 2 for 2 days, then 1 for 2 days 07/24/20   Isla Pence, MD  pregabalin (LYRICA) 75 MG capsule Take 75 mg by mouth 2 (two) times daily.     [provider]  rosuvastatin (CRESTOR) 20 MG tablet Take 20 mg by mouth daily.  10/14/17   [provider]  potassium chloride (K-DUR) 10 MEQ tablet Take 1 tablet (10 mEq total) by mouth daily. 10/11/18 01/06/19  Petrucelli, Samantha R, PA-C    Allergies    Clindamycin/lincomycin, Ibuprofen, Tomato, and Vicodin [hydrocodone-acetaminophen]  Review of Systems   Review of Systems  Constitutional: Positive for chills and fatigue. Negative for fever.  HENT: Negative for congestion and rhinorrhea.   Respiratory: Negative for cough and shortness of breath.   Cardiovascular: Positive for chest pain. Negative for palpitations.  Gastrointestinal: Negative for diarrhea, nausea and vomiting.  Genitourinary: Negative for difficulty urinating and dysuria.  Musculoskeletal: Negative for arthralgias and back pain.  Skin: Negative for rash and wound.  Neurological: Positive for  headaches. Negative for light-headedness.    Physical Exam Updated Vital Signs BP (!) 88/48   Pulse 66   Temp 98.2 F (36.8 C) (Oral)   Resp 20   Ht 5' (1.524 m)   Wt 127.5 kg   LMP 08/02/2020   SpO2 100%   BMI 54.88 kg/m   Physical Exam Vitals and nursing note reviewed. Exam conducted with a chaperone present.  Constitutional:  General: She is not in acute distress.    Appearance: Normal appearance.  HENT:     Head: Normocephalic and atraumatic.     Nose: No rhinorrhea.  Eyes:     General:        Right eye: No discharge.        Left eye: No discharge.     Conjunctiva/sclera: Conjunctivae normal.  Cardiovascular:     Rate and Rhythm: Normal rate and regular rhythm.  Pulmonary:     Effort: Pulmonary effort is normal. No respiratory distress.     Breath sounds: No stridor. No decreased breath sounds or wheezing.  Abdominal:     General: Abdomen is flat. There is no distension.     Palpations: Abdomen is soft.  Musculoskeletal:        General: No tenderness or signs of injury.  Skin:    General: Skin is warm and dry.  Neurological:     General: No focal deficit present.     Mental Status: She is alert. Mental status is at baseline.     Motor: No weakness.     Comments: 5 out of 5 motor strength in all extremities, sensation intact throughout, no dysmetria, no dysdiadochokinesia, no ataxia with ambulation, cranial nerves II through XII intact, alert and oriented to person place and time   Psychiatric:        Mood and Affect: Mood normal.        Behavior: Behavior normal.     ED Results / Procedures / Treatments   Labs (all labs ordered are listed, but only abnormal results are displayed) Labs Reviewed  BASIC METABOLIC PANEL - Abnormal; Notable for the following components:      Result Value   Potassium 2.9 (*)    Creatinine, Ser 1.25 (*)    GFR, Estimated 53 (*)    All other components within normal limits  CBC - Abnormal; Notable for the following  components:   WBC 13.9 (*)    Platelets 412 (*)    All other components within normal limits  ACETAMINOPHEN LEVEL - Abnormal; Notable for the following components:   Acetaminophen (Tylenol), Serum <10 (*)    All other components within normal limits  SARS CORONAVIRUS 2 (TAT 6-24 HRS)  PREGNANCY, URINE  TROPONIN I (HIGH SENSITIVITY)  TROPONIN I (HIGH SENSITIVITY)    EKG EKG Interpretation  Date/Time:  Wednesday Sep 03 2020 11:20:30 EDT Ventricular Rate:  76 PR Interval:  140 QRS Duration: 76 QT Interval:  420 QTC Calculation: 472 R Axis:   36 Text Interpretation: Normal sinus rhythm Low voltage QRS Borderline ECG Confirmed by Dewaine Conger [phone]) on 09/03/2020 3:06:04 PM   Radiology CT Head Wo Contrast  Result Date: 09/03/2020 CLINICAL DATA:  New or worsening headache for 3 days EXAM: CT HEAD WITHOUT CONTRAST TECHNIQUE: Contiguous axial images were obtained from the base of the skull through the vertex without intravenous contrast. Sagittal and coronal MPR images reconstructed from axial data set. COMPARISON:  None FINDINGS: Brain: Normal ventricular morphology. No midline shift or mass effect. Normal appearance of brain parenchyma. No intracranial hemorrhage, mass lesion, evidence of acute infarction, or extra-axial fluid collection. Vascular: No hyperdense vessels. Skull: Intact Sinuses/Orbits: Clear Other: N/A IMPRESSION: Normal exam. Electronically Signed   By: Lavonia Dana M.D.   On: 09/03/2020 14:54   DG Chest Portable 1 View  Result Date: 09/03/2020 CLINICAL DATA:  Chest pain and shortness of breath EXAM: PORTABLE CHEST 1 VIEW COMPARISON:  July 09, 2019 FINDINGS: Lungs are clear. Heart is mildly enlarged with pulmonary vascularity within normal limits. No adenopathy. No pneumothorax. No bone lesions. IMPRESSION: Mild cardiac enlargement.  No edema or airspace opacity. Electronically Signed   By: Lowella Grip III M.D.   On: 09/03/2020 12:59    Procedures Procedures    Medications Ordered in ED Medications  lactated ringers bolus 1,000 mL (0 mLs Intravenous Stopped 09/03/20 1432)  metoCLOPramide (REGLAN) injection 10 mg (10 mg Intravenous Given 09/03/20 1314)  diphenhydrAMINE (BENADRYL) injection 25 mg (25 mg Intravenous Given 09/03/20 1314)    ED Course  I have reviewed the triage vital signs and the nursing notes.  Pertinent labs & imaging results that were available during my care of the patient were reviewed by me and considered in my medical decision making (see chart for details).    MDM Rules/Calculators/A&P                          Patient comes with headache and chest pain.  She believes the chest pain is because she has headache.  Its not exertional.  EKG is unremarkable for any acute ischemic change interval abnormality arrhythmia.  Cardiac biomarkers negative.  Is been going on for 3 days do not need repeat.  Patient's headache is very different than usual.  She does not typically get headaches and due to her age we got a CT scan that was unremarkable after my radiology review.  Headache medication relieves symptoms almost entirely both headache and chest pain.  She feels safe for discharge home, return precautions discussed Final Clinical Impression(s) / ED Diagnoses Final diagnoses:  Nonintractable headache, unspecified chronicity pattern, unspecified headache type  Chest pain, unspecified type    Rx / DC Orders ED Discharge Orders    None       Breck Coons, MD 09/03/20 1511

## 2020-09-04 NOTE — Telephone Encounter (Signed)
Noted the pt did go to the ER as advised.

## 2020-10-31 NOTE — Progress Notes (Deleted)
Cardiology Office Note:    Date:  10/31/2020   ID:  Gerhard Perches [place], DOB [birth year], MRN [id number]  PCP:  Pcp, No  [facility] Cardiologist:  Freada Bergeron, MD  [facility] Electrophysiologist:  None   Referring MD: Leone Brand, PA-C   History of Present Illness:    [facility] is a 50 y.o. female with a hx of HTN, HLD, fibromyalgia and obesity who presents to clinic for follow-up.  The patient presented to [facility] ER on 09/14 for episode of sharp, left-sided chest pain.  ECG without ischemic changes. Trop-I <0.01, CK-MB 2, D-dimer 285. Her pain subsided without intervention, however, given her risk factors, she was recommended for stress testing.Unfortantely, she had to leave prior to testing. She re-presented to [facility] ER on 09/15 with recurrence of her left sided and central chest pain. ECG here demonstrated sinus rhythm with low voltage in the precordial leads, prolonged Qtc but no evidence of ischemia.  Labs notable for trop 3, BNP 25.4. She was given nitro for her chest pain, but it did not help. Per ED notes, pain was reproducible with palpation with low suspicion of ACS. Given negative work-up and reassuring examination in [facility] ER, the patient was discharged home with plans to follow-up in Cardiology clinic   Saw me in clinic on 01/16/21 where she was having atypical chest pain. Coronary CTA without obstructive disease. Calcium score 0. TTE with normal LVEF 55-60%, normal strain, mild-to-moderate MR.  Today,  Past Medical History:  Diagnosis Date   Anemia    Asthma    Atypical chest pain 07/28/2018   Back pain, chronic    Fibromyalgia    Hypertension    Mitral regurgitation    Morbid obesity ([facility])    Neurogenic bladder    Pain management contract signed    Palpitations 07/28/2018   Panic attacks    Prolonged Q-T interval on ECG    Reflux     Past Surgical History:  Procedure Laterality Date   BACK SURGERY     bladder stimulator     CARPAL TUNNEL RELEASE      TONSILLECTOMY     URETERAL STENT PLACEMENT  2008    Current Medications: No outpatient medications have been marked as taking for the 11/07/20 encounter (Appointment) with Freada Bergeron, MD.     Allergies:   Clindamycin/lincomycin, Ibuprofen, Tomato, and Vicodin [hydrocodone-acetaminophen]   Social History   Socioeconomic History   Marital status: Single    Spouse name: Not on file   Number of children: 4   Years of education: Not on file   Highest education level: Not on file  Occupational History   Not on file  Tobacco Use   Smoking status: Former    Pack years: 0.00   Smokeless tobacco: Never  Vaping Use   Vaping Use: Never used  Substance and Sexual Activity   Alcohol use: No   Drug use: No   Sexual activity: Not on file  Other Topics Concern   Not on file  Social History Narrative   Not on file   Social Determinants of Health   Financial Resource Strain: Not on file  Food Insecurity: Not on file  Transportation Needs: Not on file  Physical Activity: Not on file  Stress: Not on file  Social Connections: Not on file     Family History: The patient's *family history includes Heart attack in her father and mother; Heart disease in her father and sister.  ROS:  Please see the history of present illness.    Reports some migraine headaches and body aches. No fevers, chills, nausea or vomiting. No lightheadedness or dizziness. No syncope.   EKGs/Labs/Other Studies Reviewed:    The following studies were reviewed today: Coronary CTA 01/31/20: FINDINGS: Image quality: Average.   Noise artifact is: Moderate signal to noise artifact is present due to obesity (BMI 55).   Coronary Arteries:  Normal coronary origin.  Right dominance.   Left main: The left main is a large caliber vessel with a normal take off from the left coronary cusp that bifurcates to form a left anterior descending artery and a left circumflex artery. There is no plaque or  stenosis.   Left anterior descending artery: The LAD is patent without evidence of plaque or stenosis. The LAD gives off 3 patent diagonal branches. There is limited evaluation of the distal segments due to obesity artifact but these appear normal.   Left circumflex artery: The LCX is non-dominant and patent with no evidence of plaque or stenosis. Limited evaluation of the distal LCX due to obesity artifact.   Right coronary artery: The RCA is dominant with normal take off from the right coronary cusp. There is no evidence of plaque or stenosis. The RCA terminates as a PDA and right posterolateral branch without evidence of plaque or stenosis. Again, the distal segments appear normal but there is obesity artifact.   Right Atrium: Right atrial size is within normal limits.   Right Ventricle: The right ventricular cavity is within normal limits.   Left Atrium: Left atrial size is normal in size with no left atrial appendage filling defect.   Left Ventricle: The ventricular cavity size is within normal limits. There are no stigmata of prior infarction. There is no abnormal filling defect.   Pulmonary arteries: Normal in size without proximal filling defect.   Pulmonary veins: Normal pulmonary venous drainage.   Pericardium: Normal thickness with no significant effusion or calcium present.   Cardiac valves: The aortic valve is trileaflet without significant calcification. The mitral valve is normal structure without significant calcification.   Aorta: Normal caliber with no significant disease.   Extra-cardiac findings: See attached radiology report for non-cardiac structures.   IMPRESSION: 1. Coronary calcium score of 0.   2. Normal coronary origin with right dominance.   3. There is severe signal to noise artifact related to obesity (BMI 55) but the coronary arteries appear to be normal without evidence of plaque or stenosis.   4. Limited evaluation of the distal  coronary segments.   RECOMMENDATIONS: 1. No evidence of CAD (0%). Consider non-atherosclerotic causes of chest pain.  TTE 01/21/20:  1. Poor acoustic windows limit study.   2. Global longitudinal strain is -19.2%. Left ventricular ejection  fraction, by estimation, is 55 to 60%. The left ventricle has normal  function. The left ventricle has no regional wall motion abnormalities.  The left ventricular internal cavity size was   mildly dilated. Left ventricular diastolic parameters were normal.   3. Right ventricular systolic function is normal. The right ventricular  size is normal. There is normal pulmonary artery systolic pressure.   4. MR jet is eccentric, directed posterior into LA. Mild to moderate  mitral valve regurgitation.   5. Mild aortic valve sclerosis is present, with no evidence of aortic  valve stenosis.   6. The inferior vena cava is normal in size with greater than 50%  respiratory variability, suggesting right atrial pressure of 3 mmHg.  12/24/2016: Lexiscan myoview stress test 12/24/2016: 1. This was a 2-day protocol.  The resting electrocardiogram demonstrated normal sinus rhythm, normal resting conduction and no resting arrhythmias. Low voltage.   Stress EKG is non-diagnostic for ischemia as it a pharmacologic stress using Lexiscan. Stress symptoms included dyspnea, nausea and dizziness.  2. SPECT images demonstrate small perfusion abnormality of mild intensity in the apical myocardial wall(s) on the stress images.  These defects are related to apical thinning.  Gated SPECT images reveal normal myocardial thickening and wall motion.  The left ventricular ejection fraction was calculated or visually estimated to be 53%.  This is a low risk study.   EKG:  EKG is 01/08/20: NSR with low voltage. No ischemic changes. No block.  Recent Labs: 01/09/2020: B Natriuretic Peptide 25.4 09/03/2020: BUN 20; Creatinine, Ser 1.25; Hemoglobin 12.9; Platelets 412; Potassium 2.9;  Sodium 137  Recent Lipid Panel No results found for: CHOL, TRIG, HDL, CHOLHDL, VLDL, LDLCALC, LDLDIRECT  Physical Exam:    VS:  There were no vitals taken for this visit.    Wt Readings from Last 3 Encounters:  09/03/20 281 lb (127.5 kg)  07/24/20 262 lb (118.8 kg)  04/30/20 280 lb (127 kg)     GEN: Obese female, comfortable HEENT: Normal NECK: No JVD; No carotid bruits CARDIAC: RRR, no murmurs, rubs, gallops RESPIRATORY:  Clear to auscultation without rales, wheezing or rhonchi  ABDOMEN: Obese, soft, non-tender, non-distended MUSCULOSKELETAL:  Trace pedal edema; No deformity  SKIN: Warm and dry NEUROLOGIC:  Alert and oriented x 3 PSYCHIATRIC:  Normal affect   ASSESSMENT:    No diagnosis found.  PLAN:    In order of problems listed above:  1. Dyspnea on Exertion: Likely secondary to underlying asthma and obesity/deconditioning. Coronary CTA without obstructive disease, Ca scoree 0. TTE with normal LVEF, mild-to-moderate MR. -Follow-up with Pulm  2. Chest Pain: Improved. Reassuring work-up as above. -Coronary CTA without obstructive disease -TTE with normal LVEF, mild-to-moderate MR  3.  Hypertension: Well controlled today. Managed by PCP. -Cotinue atenolol-chlorthalidone -Continue hydralazine 100mg  BID -Continue losartan 100mg  daily  4. Hyperlipidemia -On crestor 20mg  daily  5. Morbid Obesity: BMI 56.  -Extensive counseling regarding the importance of weight loss as likely contributing to the pain the joint pain and MSK pain she is having. She is working on diet. Exercise is limited due to SOB but she is trying to be more active. Will continue to encourage lifestyle changes. -Will check hemoglobin A1C during labs  6. Mild-to-Moderate MR: -Serial TTE monitoring every 2-3 years   Medication Adjustments/Labs and Tests Ordered: Current medicines are reviewed at length with the patient today.  Concerns regarding medicines are outlined above.  No orders of the  defined types were placed in this encounter.  No orders of the defined types were placed in this encounter.   There are no Patient Instructions on file for this visit.   Signed, Freada Bergeron, MD  10/31/2020 7:04 PM    [place]

## 2020-11-07 ENCOUNTER — Ambulatory Visit: Payer: Medicaid Other | Admitting: Cardiology

## 2020-12-31 ENCOUNTER — Ambulatory Visit (HOSPITAL_BASED_OUTPATIENT_CLINIC_OR_DEPARTMENT_OTHER): Payer: Medicaid Other | Admitting: Family

## 2020-12-31 NOTE — Progress Notes (Deleted)
Office Visit    Patient Name: Felicia Molina Date of Encounter: 12/31/2020  PCP:  Kathyrn Lass   [place]  Cardiologist:  Freada Bergeron, MD  Advanced Practice Provider:  No care team member to display Electrophysiologist:  None     Chief Complaint    Felicia Molina is a 50 y.o. female with a hx of hypertension, hyperlipidemia, fibromyalgia, obesity presents today for ***   Past Medical History    Past Medical History:  Diagnosis Date   Anemia    Asthma    Atypical chest pain 07/28/2018   Back pain, chronic    Fibromyalgia    Hypertension    Mitral regurgitation    Morbid obesity ([place])    Neurogenic bladder    Pain management contract signed    Palpitations 07/28/2018   Panic attacks    Prolonged Q-T interval on ECG    Reflux    Past Surgical History:  Procedure Laterality Date   BACK SURGERY     bladder stimulator     CARPAL TUNNEL RELEASE     TONSILLECTOMY     URETERAL STENT PLACEMENT  2008    Allergies  Allergies  Allergen Reactions   Clindamycin/Lincomycin Hives and Swelling   Ibuprofen Hives    Hives across the face   Tomato Hives   Vicodin [Hydrocodone-Acetaminophen] Hives    History of Present Illness    Felicia Molina is a 50 y.o. female with a hx of hypertension, hyperlipidemia, fibromyalgia, obesity last seen 01/16/2020 by Dr. Johney Frame.  Family history notable for coronary artery disease with father dying of MI at unknown age and multiple maternal aunts with coronary disease.  Seen in the ED 01/07/21 for sharp left-sided chest pain.  EKG without ischemic changes, troponin negative.  She was recommended for stress testing but had to leave prior to testing due to lack of childcare.  She presented to [facility], ER 01/08/2021 with recurrence of chest pain.  EKG with sinus rhythm with low voltage in precordial leads, prolonged QTC but no evidence of ischemia.  High sensitive troponin and BNP were unremarkable.  Pain was  reproducible on palpation with low suspicion for ACS.  At visit 01/14/2020 with Dr. Johney Frame she was recommended for TTE and cardiac CTA. ***  Echocardiogram 01/21/2020 LVEF 55 to 60%, no R WMA, normal diastolic parameters, mild to moderate MR.  Cardiac CTA 01/31/2020 with coronary calcium score of 0.  There were no significant incidental noncardiac findings.  ED visit 04/30/2020 for URI-like symptoms.  She was positive for COVID-19.  ED visit 07/24/2020 with low back and right hip pain.  She was treated with prednisone pack.  ED visit 09/03/2020 for headache as well as chest pain.  EKG nonacute. ***    EKGs/Labs/Other Studies Reviewed:   The following studies were reviewed today:  Cardiac CTA 01/2020 IMPRESSION: 1. Coronary calcium score of 0.   2. Normal coronary origin with right dominance.   3. There is severe signal to noise artifact related to obesity (BMI 55) but the coronary arteries appear to be normal without evidence of plaque or stenosis.   4. Limited evaluation of the distal coronary segments.   RECOMMENDATIONS: 1. No evidence of CAD (0%). Consider non-atherosclerotic causes of chest pain.    Echocardiogram 01/21/2020  1. Poor acoustic windows limit study.   2. Global longitudinal strain is -19.2%. Left ventricular ejection  fraction, by estimation, is 55 to 60%. The left ventricle has normal  function. The left ventricle has no regional wall motion abnormalities.  The left ventricular internal cavity size was   mildly dilated. Left ventricular diastolic parameters were normal.   3. Right ventricular systolic function is normal. The right ventricular  size is normal. There is normal pulmonary artery systolic pressure.   4. MR jet is eccentric, directed posterior into LA. Mild to moderate  mitral valve regurgitation.   5. Mild aortic valve sclerosis is present, with no evidence of aortic  valve stenosis.   6. The inferior vena cava is normal in size with greater than  50%  respiratory variability, suggesting right atrial pressure of 3 mmHg.   12/24/2016: Lexiscan myoview stress test 12/24/2016: 1. This was a 2-day protocol.  The resting electrocardiogram demonstrated normal sinus rhythm, normal resting conduction and no resting arrhythmias. Low voltage.   Stress EKG is non-diagnostic for ischemia as it a pharmacologic stress using Lexiscan. Stress symptoms included dyspnea, nausea and dizziness.  2. SPECT images demonstrate small perfusion abnormality of mild intensity in the apical myocardial wall(s) on the stress images.  These defects are related to apical thinning.  Gated SPECT images reveal normal myocardial thickening and wall motion.  The left ventricular ejection fraction was calculated or visually estimated to be 53%.  This is a low risk study.     EKG: No EKG today   Recent Labs: 01/09/2020: B Natriuretic Peptide 25.4 09/03/2020: BUN 20; Creatinine, Ser 1.25; Hemoglobin 12.9; Platelets 412; Potassium 2.9; Sodium 137  Recent Lipid Panel No results found for: CHOL, TRIG, HDL, CHOLHDL, VLDL, LDLCALC, LDLDIRECT   Home Medications   No outpatient medications have been marked as taking for the 12/31/20 encounter (Appointment) with Loel Dubonnet, NP.     Review of Systems     All other systems reviewed and are otherwise negative except as noted above.  Physical Exam    VS:  There were no vitals taken for this visit. , BMI There is no height or weight on file to calculate BMI.  Wt Readings from Last 3 Encounters:  09/03/20 281 lb (127.5 kg)  07/24/20 262 lb (118.8 kg)  04/30/20 280 lb (127 kg)     GEN: Well nourished, well developed, in no acute distress. HEENT: normal. Neck: Supple, no JVD, carotid bruits, or masses. Cardiac: ***RRR, no murmurs, rubs, or gallops. No clubbing, cyanosis, edema.  ***Radials/PT 2+ and equal bilaterally.  Respiratory:  ***Respirations regular and unlabored, clear to auscultation bilaterally. GI: Soft,  nontender, nondistended. MS: No deformity or atrophy. Skin: Warm and dry, no rash. Neuro:  Strength and sensation are intact. Psych: Normal affect.  Assessment & Plan    *** Hypertension -   Hyperlipidemia -   Morbid obesity -   Mitral regurgitation -mild to moderate by echo 12/2019.  Plan for repeat echo for monitoring 2 to 3 years.  Continue optimal blood pressure control.  Disposition: Follow up {follow up:15908} with Dr. Johney Frame or APP.  Signed, Loel Dubonnet, NP 12/31/2020, 8:42 AM [place]

## 2021-01-08 ENCOUNTER — Emergency Department (HOSPITAL_BASED_OUTPATIENT_CLINIC_OR_DEPARTMENT_OTHER)
Admission: EM | Admit: 2021-01-08 | Discharge: 2021-01-08 | Disposition: A | Discharge status: Home / Self Care | Payer: Medicaid Other | Attending: Emergency Medicine | Admitting: Emergency Medicine

## 2021-01-08 ENCOUNTER — Encounter (HOSPITAL_BASED_OUTPATIENT_CLINIC_OR_DEPARTMENT_OTHER): Payer: Self-pay | Admitting: *Deleted

## 2021-01-08 ENCOUNTER — Other Ambulatory Visit: Payer: Self-pay

## 2021-01-08 DIAGNOSIS — Z8616 Personal history of COVID-19: Secondary | ICD-10-CM | POA: Insufficient documentation

## 2021-01-08 DIAGNOSIS — I10 Essential (primary) hypertension: Secondary | ICD-10-CM | POA: Insufficient documentation

## 2021-01-08 DIAGNOSIS — Z7951 Long term (current) use of inhaled steroids: Secondary | ICD-10-CM | POA: Insufficient documentation

## 2021-01-08 DIAGNOSIS — Z79899 Other long term (current) drug therapy: Secondary | ICD-10-CM | POA: Diagnosis not present

## 2021-01-08 DIAGNOSIS — J45909 Unspecified asthma, uncomplicated: Secondary | ICD-10-CM | POA: Diagnosis not present

## 2021-01-08 DIAGNOSIS — Z87891 Personal history of nicotine dependence: Secondary | ICD-10-CM | POA: Insufficient documentation

## 2021-01-08 DIAGNOSIS — R059 Cough, unspecified: Secondary | ICD-10-CM | POA: Diagnosis present

## 2021-01-08 DIAGNOSIS — Z20822 Contact with and (suspected) exposure to covid-19: Secondary | ICD-10-CM | POA: Insufficient documentation

## 2021-01-08 DIAGNOSIS — Z28311 Partially vaccinated for covid-19: Secondary | ICD-10-CM | POA: Diagnosis not present

## 2021-01-08 DIAGNOSIS — J069 Acute upper respiratory infection, unspecified: Secondary | ICD-10-CM | POA: Diagnosis not present

## 2021-01-08 LAB — RESP PANEL BY RT-PCR (FLU A&B, COVID) ARPGX2
Influenza A by PCR: NEGATIVE
Influenza B by PCR: NEGATIVE
SARS Coronavirus 2 by RT PCR: NEGATIVE

## 2021-01-08 NOTE — Discharge Instructions (Addendum)
Your test for COVID-19 is pending.  Please follow-up on the results on MyChart.  The results should be available in the next 2 hours.  You may always call your primary care provider to discuss a positive results and whether to treat with the antiviral paxlovid.    Please use Tylenol or ibuprofen for pain.  You may use 600 mg ibuprofen every 6 hours or 1000 mg of Tylenol every 6 hours.  You may choose to alternate between the 2.  This would be most effective.  Not to exceed 4 g of Tylenol within 24 hours.  Not to exceed 3200 mg ibuprofen 24 hours.  I specifically recommend Tylenol

## 2021-01-08 NOTE — ED Triage Notes (Signed)
Cough and sore throat today. Her family is sick with same symptoms.

## 2021-01-08 NOTE — ED Provider Notes (Signed)
[facility] EMERGENCY DEPARTMENT Provider Note   CSN: [id number] Arrival date & time: 01/08/21  1209     History Chief Complaint  Patient presents with   Cough   Sore Throat    Felicia Molina is a 50 y.o. female.  HPI Patient is a 50 year old female with past medical history detailed below she presented to the emergency department today with complaints of cough and sore throat.  She is not producing any significant sputum.  Denies any chest pain lightheadedness fevers or chills nausea vomiting diarrhea or significant fatigue.  She states she is primarily concerned about COVID because she was exposed. She states she only received 1 vaccination.  She states she had COVID-19 approximately 1 year ago  Denies any other associate symptoms no shortness of breath.  States she otherwise feels quite well.  She is with her son today who is also had a sore throat and congestion and cough.     Past Medical History:  Diagnosis Date   Anemia    Asthma    Atypical chest pain 07/28/2018   Back pain, chronic    Fibromyalgia    Hypertension    Mitral regurgitation    Morbid obesity ([place])    Neurogenic bladder    Pain management contract signed    Palpitations 07/28/2018   Panic attacks    Prolonged Q-T interval on ECG    Reflux     Patient Active Problem List   Diagnosis Date Noted   Atypical chest pain 07/28/2018   Palpitations 07/28/2018   Right wrist pain 11/08/2017   Asthma 10/14/2017   Depression 10/14/2017   GERD (gastroesophageal reflux disease) 10/14/2017   Fibroid 09/09/2017   Morbid obesity with BMI of 50.0-59.9, adult ([place]) 08/05/2016   Rheumatoid arthritis ([place]) 08/05/2016   Chronic bilateral low back pain without sciatica 05/13/2015   Plantar fasciitis, bilateral 07/19/2012   Metatarsalgia of both feet 07/19/2012   Acute respiratory failure ([place]) 05/07/2012   Hypertension    CAP (community acquired pneumonia) 05/06/2012    Past Surgical History:   Procedure Laterality Date   BACK SURGERY     bladder stimulator     CARPAL TUNNEL RELEASE     TONSILLECTOMY     URETERAL STENT PLACEMENT  2008     OB History     Gravida  6   Para  4   Term  4   Preterm  0   AB  2   Living  4      SAB  2   IAB  0   Ectopic  0   Multiple  0   Live Births  4           Family History  Problem Relation Age of Onset   Heart attack Mother    Heart disease Father    Heart attack Father    Heart disease Sister     Social History   Tobacco Use   Smoking status: Former   Smokeless tobacco: Never  Scientific laboratory technician Use: Never used  Substance Use Topics   Alcohol use: No   Drug use: No    Home Medications Prior to Admission medications   Medication Sig Start Date End Date Taking? Authorizing Provider  albuterol (VENTOLIN HFA) 108 (90 Base) MCG/ACT inhaler Inhale 2 puffs into the lungs every 4 (four) hours as needed for wheezing or shortness of breath. 04/07/19   Noemi Chapel, MD  atenolol-chlorthalidone (TENORETIC) 100-25 MG tablet  Take 1 tablet by mouth daily.  10/14/17   [provider]  budesonide-formoterol (SYMBICORT) 160-4.5 MCG/ACT inhaler Inhale 2 puffs into the lungs 2 (two) times daily.    [provider]  buPROPion (WELLBUTRIN SR) 150 MG 12 hr tablet Take 150 mg by mouth 2 (two) times daily.  10/14/17   [provider]  cetirizine (ZYRTEC) 10 MG tablet Take 10 mg by mouth daily.  10/14/17   [provider]  cyclobenzaprine (FLEXERIL) 10 MG tablet Take 10 mg by mouth 3 (three) times daily as needed. Takes for muscle spasms    [provider]  diclofenac sodium (VOLTAREN) 1 % GEL Apply 2 g topically 4 (four) times daily.    [provider]  fluticasone (FLONASE) 50 MCG/ACT nasal spray 2 SPRAYS INTO EACH NOSTRIL DAILY 10/14/17   [provider]  furosemide (LASIX) 40 MG tablet Take 1 tablet (40 mg total) by mouth daily. Patient taking differently: Take  20 mg by mouth daily.  05/09/12   Verlee Monte, MD  hydrALAZINE (APRESOLINE) 100 MG tablet Take 100 mg by mouth 2 (two) times daily. 12/08/19   [provider]  ipratropium (ATROVENT) 0.06 % nasal spray Place 2 sprays into the nose 4 (four) times daily. 03/04/13   Billy Fischer, MD  losartan (COZAAR) 100 MG tablet TK 1 T PO DAILY 10/14/17   [provider]  metoprolol tartrate (LOPRESSOR) 100 MG tablet Take '100mg'$  tablet 2 hours prior to your cardiac CT scan. 01/18/20   Freada Bergeron, MD  montelukast (SINGULAIR) 10 MG tablet Take 10 mg by mouth at bedtime.    [provider]  omeprazole (PRILOSEC) 40 MG capsule Take 40 mg by mouth daily. 01/03/20   [provider]  ondansetron (ZOFRAN) 4 MG tablet Take 1 tablet (4 mg total) by mouth every 8 (eight) hours as needed for nausea or vomiting. 04/30/20   Marcello Fennel, PA-C  potassium chloride SA (K-DUR,KLOR-CON) 20 MEQ tablet Take 1 tablet (20 mEq total) by mouth daily. 09/03/17   Drenda Freeze, MD  predniSONE (STERAPRED UNI-PAK 21 TAB) 10 MG (21) TBPK tablet Take 6 tabs for 2 days, then 5 for 2 days, then 4 for 2 days, then 3 for 2 days, 2 for 2 days, then 1 for 2 days 07/24/20   Isla Pence, MD  pregabalin (LYRICA) 75 MG capsule Take 75 mg by mouth 2 (two) times daily.     [provider]  rosuvastatin (CRESTOR) 20 MG tablet Take 20 mg by mouth daily.  10/14/17   [provider]  potassium chloride (K-DUR) 10 MEQ tablet Take 1 tablet (10 mEq total) by mouth daily. 10/11/18 01/06/19  Petrucelli, Samantha R, PA-C    Allergies    Clindamycin/lincomycin, Ibuprofen, Tomato, and Vicodin [hydrocodone-acetaminophen]  Review of Systems   Review of Systems  Constitutional:  Negative for chills, fatigue and fever.  HENT:  Positive for congestion.   Eyes:  Negative for pain.  Respiratory:  Positive for cough. Negative for shortness of breath.   Cardiovascular:  Negative for chest pain and leg  swelling.  Gastrointestinal:  Negative for abdominal pain and vomiting.  Genitourinary:  Negative for dysuria.  Musculoskeletal:  Negative for myalgias.  Skin:  Negative for rash.  Neurological:  Negative for dizziness and headaches.   Physical Exam Updated Vital Signs BP 119/62 (BP Location: Left Arm)   Pulse 71   Temp 98.6 F (37 C) (Oral)   Resp 20  Ht 5' (1.524 m)   Wt 127.5 kg   SpO2 99%   BMI 54.90 kg/m   Physical Exam Vitals and nursing note reviewed.  Constitutional:      General: She is not in acute distress.    Appearance: She is obese.     Comments: Pleasant well-appearing 50 year old.  In no acute distress.  Sitting comfortably in bed.  Able answer questions appropriately follow commands. No increased work of breathing. Speaking in full sentences.   HENT:     Head: Normocephalic and atraumatic.     Nose: Nose normal.     Mouth/Throat:     Comments: Moist oral mucosa.  Posterior pharynx with some cobblestoning of posterior nasal drainage.  Uvula is midline Eyes:     General: No scleral icterus. Cardiovascular:     Rate and Rhythm: Normal rate and regular rhythm.     Pulses: Normal pulses.     Heart sounds: Normal heart sounds.  Pulmonary:     Effort: Pulmonary effort is normal. No respiratory distress.     Breath sounds: No wheezing.  Abdominal:     Palpations: Abdomen is soft.     Tenderness: There is no abdominal tenderness.  Musculoskeletal:     Cervical back: Normal range of motion.     Right lower leg: No edema.     Left lower leg: No edema.  Skin:    General: Skin is warm and dry.     Capillary Refill: Capillary refill takes less than 2 seconds.  Neurological:     Mental Status: She is alert. Mental status is at baseline.  Psychiatric:        Mood and Affect: Mood normal.        Behavior: Behavior normal.    ED Results / Procedures / Treatments   Labs (all labs ordered are listed, but only abnormal results are displayed) Labs Reviewed   RESP PANEL BY RT-PCR (FLU A&B, COVID) [id number]    EKG None  Radiology No results found.  Procedures Procedures   Medications Ordered in ED Medications - No data to display  ED Course  I have reviewed the triage vital signs and the nursing notes.  Pertinent labs & imaging results that were available during my care of the patient were reviewed by me and considered in my medical decision making (see chart for details).    MDM Rules/Calculators/A&P                          Patient is 50 year old female presented today with cough sore throat with some congestion family with similar symptoms wanted to be tested for COVID  Physical exam is benign.  Recommended Tylenol and Flonase Zyrtec and follow-up with PCP  Tested for COVID in case this is causing her symptoms discharged home prior to results  I followed up on COVID testing he does negative for COVID, influenza a and B  Felicia Molina was evaluated in Emergency Department on 01/08/2021 for the symptoms described in the history of present illness. She was evaluated in the context of the global COVID-19 pandemic, which necessitated consideration that the patient might be at risk for infection with the SARS-CoV-2 virus that causes COVID-19. Institutional protocols and algorithms that pertain to the evaluation of patients at risk for COVID-19 are in a state of rapid change based on information released by regulatory bodies including the CDC and federal and state organizations. These policies and algorithms were followed during  the patient's care in the ED.   Final Clinical Impression(s) / ED Diagnoses Final diagnoses:  Viral URI with cough  Suspected COVID-19 virus infection    Rx / DC Orders ED Discharge Orders     None        Tedd Sias, [place] 01/08/21 1543    Lucrezia Starch, MD 01/09/21 1248

## 2021-01-08 NOTE — ED Notes (Signed)
D/c paperwork reviewed with pt. Pt with no questions or concerns at time of d/c. Pt ambulatory to ED exit.

## 2021-03-17 ENCOUNTER — Ambulatory Visit (HOSPITAL_BASED_OUTPATIENT_CLINIC_OR_DEPARTMENT_OTHER): Payer: Medicaid Other | Admitting: Family

## 2021-05-05 NOTE — Progress Notes (Deleted)
Cardiology Office Note:    Date:  05/05/2021   ID:  Felicia Molina, DOB 1970-11-04, MRN [id number]  PCP:  Pcp, No  [facility] Cardiologist:  Freada Bergeron, MD  [place] Electrophysiologist:  None   Referring MD: No ref. provider found   History of Present Illness:    Felicia Molina is a 51 y.o. female with a hx of HTN, HLD, fibromyalgia and obesity who was referred by Dr. Melina Copa for evaluation of chest pain.  Of note, the patient was recently seen in the [facility] ER on 01/07/21 for episode of sharp, left-sided chest pain.  ECG without ischemic changes. Trop-I <0.01, CK-MB 2, D-dimer 285. Her pain subsided without intervention, however, given her risk factors, she was recommended for stress testing.Unfortantely, she had to leave prior to testing as she had her daughter with her and did not have childcare available. She re-presented to [facility] ER on 09/15 with recurrence of her left sided and central chest pain. ECG here demonstrated sinus rhythm with low voltage in the precordial leads, prolonged Qtc but no evidence of ischemia.  Labs notable for trop 3, BNP 25.4. She was given nitro for her chest pain, but it did not help. Per ED notes, pain was reproducible with palpation with low suspicion of ACS. Given negative work-up and reassuring examination in [facility] ER, the patient was discharged home with plans to follow-up in Cardiology clinic.  She was seen in clinic on 01/16/21 with continued discomfort. TTE 01/20/21 with LVEF 55-60%, -19.2% GLS, mild-to-moderate MR. CTA coronaries 01/31/20 with Ca scoer 0, no significant obstructive disease. Has missed several follow-up appointments.  Family history notable mother has coronary artery disease, father died of MI (unknown age), multiple maternal aunts with coronary disease.    Past Medical History:  Diagnosis Date   Anemia    Asthma    Atypical chest pain 07/28/2018   Back pain, chronic    Fibromyalgia    Hypertension    Mitral regurgitation     Morbid obesity (HCC)    Neurogenic bladder    Pain management contract signed    Palpitations 07/28/2018   Panic attacks    Prolonged Q-T interval on ECG    Reflux     Past Surgical History:  Procedure Laterality Date   BACK SURGERY     bladder stimulator     CARPAL TUNNEL RELEASE     TONSILLECTOMY     URETERAL STENT PLACEMENT  2008    Current Medications: No outpatient medications have been marked as taking for the 05/07/21 encounter (Appointment) with Freada Bergeron, MD.     Allergies:   Clindamycin/lincomycin, Ibuprofen, Tomato, and Vicodin [hydrocodone-acetaminophen]   Social History   Socioeconomic History   Marital status: Single    Spouse name: Not on file   Number of children: 4   Years of education: Not on file   Highest education level: Not on file  Occupational History   Not on file  Tobacco Use   Smoking status: Former   Smokeless tobacco: Never  Vaping Use   Vaping Use: Never used  Substance and Sexual Activity   Alcohol use: No   Drug use: No   Sexual activity: Not on file  Other Topics Concern   Not on file  Social History Narrative   Not on file   Social Determinants of Health   Financial Resource Strain: Not on file  Food Insecurity: Not on file  Transportation Needs: Not on file  Physical Activity:  Not on file  Stress: Not on file  Social Connections: Not on file     Family History: The patient's *family history includes Heart attack in her father and mother; Heart disease in her father and sister.  ROS:   Please see the history of present illness.    Reports some migraine headaches and body aches. No fevers, chills, nausea or vomiting. No lightheadedness or dizziness. No syncope.   EKGs/Labs/Other Studies Reviewed:    The following studies were reviewed today: CTA Coronaries 01/31/20: IMPRESSION: 1. Coronary calcium score of 0.   2. Normal coronary origin with right dominance.   3. There is severe signal to noise  artifact related to obesity (BMI 55) but the coronary arteries appear to be normal without evidence of plaque or stenosis.   4. Limited evaluation of the distal coronary segments.  TTE 01/21/20: IMPRESSIONS     1. Poor acoustic windows limit study.   2. Global longitudinal strain is -19.2%. Left ventricular ejection  fraction, by estimation, is 55 to 60%. The left ventricle has normal  function. The left ventricle has no regional wall motion abnormalities.  The left ventricular internal cavity size was   mildly dilated. Left ventricular diastolic parameters were normal.   3. Right ventricular systolic function is normal. The right ventricular  size is normal. There is normal pulmonary artery systolic pressure.   4. MR jet is eccentric, directed posterior into LA. Mild to moderate  mitral valve regurgitation.   5. Mild aortic valve sclerosis is present, with no evidence of aortic  valve stenosis.   6. The inferior vena cava is normal in size with greater than 50%  respiratory variability, suggesting right atrial pressure of 3 mmHg.  12/24/2016: Lexiscan myoview stress test 12/24/2016: 1. This was a 2-day protocol.  The resting electrocardiogram demonstrated normal sinus rhythm, normal resting conduction and no resting arrhythmias. Low voltage.   Stress EKG is non-diagnostic for ischemia as it a pharmacologic stress using Lexiscan. Stress symptoms included dyspnea, nausea and dizziness.  2. SPECT images demonstrate small perfusion abnormality of mild intensity in the apical myocardial wall(s) on the stress images.  These defects are related to apical thinning.  Gated SPECT images reveal normal myocardial thickening and wall motion.  The left ventricular ejection fraction was calculated or visually estimated to be 53%.  This is a low risk study.   EKG:    Recent Labs: 09/03/2020: BUN 20; Creatinine, Ser 1.25; Hemoglobin 12.9; Platelets 412; Potassium 2.9; Sodium 137  Recent Lipid  Panel No results found for: CHOL, TRIG, HDL, CHOLHDL, VLDL, LDLCALC, LDLDIRECT  Physical Exam:    VS:  There were no vitals taken for this visit.    Wt Readings from Last 3 Encounters:  01/08/21 281 lb 1.4 oz (127.5 kg)  09/03/20 281 lb (127.5 kg)  07/24/20 262 lb (118.8 kg)     GEN: Obese female, comfortable HEENT: Normal NECK: No JVD; No carotid bruits CARDIAC: RRR, no murmurs, rubs, gallops RESPIRATORY:  Clear to auscultation without rales, wheezing or rhonchi  ABDOMEN: Obese, soft, non-tender, non-distended MUSCULOSKELETAL:  Trace pedal edema; No deformity  SKIN: Warm and dry NEUROLOGIC:  Alert and oriented x 3 PSYCHIATRIC:  Normal affect   ASSESSMENT:    No diagnosis found.  PLAN:    In order of problems listed above:  #Dyspnea on Exertion: Likely secondary to underlying asthma and obesity with reassuring cardiac work-up. Coronary CTA without obstructive disease; Ca score 0. TTE with normal EF, normal strain,  mild-mod MR.  -Follow-up with Pulm as scheduled  #Hypertension: Well controlled today. Managed by PCP. -Cotinue atenolol-chlorthalidone -Continue hydralazine 100mg  BID -Continue losartan 100mg  daily  #Hyperlipidemia -On crestor 20mg  daily  #Morbid Obesity: BMI 56.  -Extensive counseling regarding the importance of weight loss as likely contributing to the pain the joint pain and MSK pain she is having. She is working on diet. Exercise is limited due to SOB but she is trying to be more active. Will continue to encourage lifestyle changes. -Will check hemoglobin A1C during labs   Medication Adjustments/Labs and Tests Ordered: Current medicines are reviewed at length with the patient today.  Concerns regarding medicines are outlined above.  No orders of the defined types were placed in this encounter.  No orders of the defined types were placed in this encounter.   There are no Patient Instructions on file for this visit.   Signed, Freada Bergeron, MD  05/05/2021 6:30 AM    [place]

## 2021-05-07 ENCOUNTER — Ambulatory Visit: Payer: Medicaid Other | Admitting: Cardiology

## 2021-05-10 ENCOUNTER — Emergency Department (HOSPITAL_BASED_OUTPATIENT_CLINIC_OR_DEPARTMENT_OTHER): Payer: Medicaid Other

## 2021-05-10 ENCOUNTER — Encounter (HOSPITAL_BASED_OUTPATIENT_CLINIC_OR_DEPARTMENT_OTHER): Payer: Self-pay

## 2021-05-10 ENCOUNTER — Emergency Department (HOSPITAL_BASED_OUTPATIENT_CLINIC_OR_DEPARTMENT_OTHER)
Admission: EM | Admit: 2021-05-10 | Discharge: 2021-05-10 | Disposition: A | Discharge status: Home / Self Care | Payer: Medicaid Other | Attending: Emergency Medicine | Admitting: Emergency Medicine

## 2021-05-10 ENCOUNTER — Other Ambulatory Visit: Payer: Self-pay

## 2021-05-10 DIAGNOSIS — Z79899 Other long term (current) drug therapy: Secondary | ICD-10-CM | POA: Diagnosis not present

## 2021-05-10 DIAGNOSIS — Z20822 Contact with and (suspected) exposure to covid-19: Secondary | ICD-10-CM | POA: Insufficient documentation

## 2021-05-10 DIAGNOSIS — R0602 Shortness of breath: Secondary | ICD-10-CM | POA: Insufficient documentation

## 2021-05-10 DIAGNOSIS — I10 Essential (primary) hypertension: Secondary | ICD-10-CM | POA: Diagnosis not present

## 2021-05-10 DIAGNOSIS — R0789 Other chest pain: Secondary | ICD-10-CM | POA: Diagnosis not present

## 2021-05-10 DIAGNOSIS — J45909 Unspecified asthma, uncomplicated: Secondary | ICD-10-CM | POA: Insufficient documentation

## 2021-05-10 LAB — CBC
HCT: 39.4 % (ref 36.0–46.0)
Hemoglobin: 13 g/dL (ref 12.0–15.0)
MCH: 29.6 pg (ref 26.0–34.0)
MCHC: 33 g/dL (ref 30.0–36.0)
MCV: 89.7 fL (ref 80.0–100.0)
Platelets: 428 10*3/uL — ABNORMAL HIGH (ref 150–400)
RBC: 4.39 MIL/uL (ref 3.87–5.11)
RDW: 14.1 % (ref 11.5–15.5)
WBC: 12.6 10*3/uL — ABNORMAL HIGH (ref 4.0–10.5)
nRBC: 0 % (ref 0.0–0.2)

## 2021-05-10 LAB — RESP PANEL BY RT-PCR (FLU A&B, COVID) ARPGX2
Influenza A by PCR: NEGATIVE
Influenza B by PCR: NEGATIVE
SARS Coronavirus 2 by RT PCR: NEGATIVE

## 2021-05-10 LAB — BASIC METABOLIC PANEL
Anion gap: 9 (ref 5–15)
BUN: 25 mg/dL — ABNORMAL HIGH (ref 6–20)
CO2: 25 mmol/L (ref 22–32)
Calcium: 9 mg/dL (ref 8.9–10.3)
Chloride: 105 mmol/L (ref 98–111)
Creatinine, Ser: 1.33 mg/dL — ABNORMAL HIGH (ref 0.44–1.00)
GFR, Estimated: 48 mL/min — ABNORMAL LOW (ref >60)
Glucose, Bld: 91 mg/dL (ref 70–99)
Potassium: 2.9 mmol/L — ABNORMAL LOW (ref 3.5–5.1)
Sodium: 139 mmol/L (ref 135–145)

## 2021-05-10 LAB — TROPONIN I (HIGH SENSITIVITY): Troponin I (High Sensitivity): 2 ng/L (ref <18)

## 2021-05-10 LAB — PREGNANCY, URINE: Preg Test, Ur: NEGATIVE

## 2021-05-10 MED ORDER — POTASSIUM CHLORIDE CRYS ER 20 MEQ PO TBCR: 40.0000 meq | EXTENDED_RELEASE_TABLET | Freq: Every day | ORAL | 0 refills | Status: DC

## 2021-05-10 MED ORDER — POTASSIUM CHLORIDE 10 MEQ/100ML IV SOLN
10.0000 meq | Freq: Once | INTRAVENOUS | Status: DC
  Filled 2021-05-10: qty 100

## 2021-05-10 MED ORDER — POTASSIUM CHLORIDE CRYS ER 20 MEQ PO TBCR
40.0000 meq | EXTENDED_RELEASE_TABLET | Freq: Once | ORAL | Status: AC
  Administered 2021-05-10: 40 meq via ORAL
  Filled 2021-05-10: qty 2

## 2021-05-10 NOTE — ED Provider Notes (Signed)
[facility] EMERGENCY DEPARTMENT Provider Note   CSN: [id number] Arrival date & time: 05/10/21  1250     History  Chief Complaint  Patient presents with   Chest Pain    Felicia Molina is a 51 y.o. female.   Chest Pain Associated symptoms: shortness of breath    Patient with anemia, atypical chest pain, fibromyalgia, hypertension, mitral regurg, morbid obesity, GERD presents due to chest pain.  Chest pain started 2 days ago, it is provoked by movement or coughing.  It feels like a sharp pain, she feels it all over her chest without radiation to the back or the neck.  There is no nausea or vomiting, it was not improved significantly with the narcotic medicine she takes for chronic pain.     Past Medical History:  Diagnosis Date   Anemia    Asthma    Atypical chest pain 07/28/2018   Back pain, chronic    Fibromyalgia    Hypertension    Mitral regurgitation    Morbid obesity (HCC)    Neurogenic bladder    Pain management contract signed    Palpitations 07/28/2018   Panic attacks    Prolonged Q-T interval on ECG    Reflux      Home Medications Prior to Admission medications   Medication Sig Start Date End Date Taking? Authorizing Provider  albuterol (VENTOLIN HFA) 108 (90 Base) MCG/ACT inhaler Inhale 2 puffs into the lungs every 4 (four) hours as needed for wheezing or shortness of breath. 04/07/19   Noemi Chapel, MD  atenolol-chlorthalidone (TENORETIC) 100-25 MG tablet Take 1 tablet by mouth daily.  10/14/17   [provider]  budesonide-formoterol (SYMBICORT) 160-4.5 MCG/ACT inhaler Inhale 2 puffs into the lungs 2 (two) times daily.    [provider]  buPROPion (WELLBUTRIN SR) 150 MG 12 hr tablet Take 150 mg by mouth 2 (two) times daily.  10/14/17   [provider]  cetirizine (ZYRTEC) 10 MG tablet Take 10 mg by mouth daily.  10/14/17   [provider]  cyclobenzaprine (FLEXERIL) 10 MG tablet Take 10 mg by mouth 3 (three) times  daily as needed. Takes for muscle spasms    [provider]  diclofenac sodium (VOLTAREN) 1 % GEL Apply 2 g topically 4 (four) times daily.    [provider]  fluticasone (FLONASE) 50 MCG/ACT nasal spray 2 SPRAYS INTO EACH NOSTRIL DAILY 10/14/17   [provider]  furosemide (LASIX) 40 MG tablet Take 1 tablet (40 mg total) by mouth daily. Patient taking differently: Take 20 mg by mouth daily.  05/09/12   Verlee Monte, MD  hydrALAZINE (APRESOLINE) 100 MG tablet Take 100 mg by mouth 2 (two) times daily. 12/08/19   [provider]  ipratropium (ATROVENT) 0.06 % nasal spray Place 2 sprays into the nose 4 (four) times daily. 03/04/13   Billy Fischer, MD  losartan (COZAAR) 100 MG tablet TK 1 T PO DAILY 10/14/17   [provider]  metoprolol tartrate (LOPRESSOR) 100 MG tablet Take 100mg  tablet 2 hours prior to your cardiac CT scan. 01/18/20   Freada Bergeron, MD  montelukast (SINGULAIR) 10 MG tablet Take 10 mg by mouth at bedtime.    [provider]  omeprazole (PRILOSEC) 40 MG capsule Take 40 mg by mouth daily. 01/03/20   [provider]  ondansetron (ZOFRAN) 4 MG tablet Take 1 tablet (4 mg total) by mouth every 8 (eight) hours as needed for nausea or vomiting. 04/30/20  Marcello Fennel, PA-C  potassium chloride SA (KLOR-CON M) 20 MEQ tablet Take 2 tablets (40 mEq total) by mouth daily. 05/10/21   Sherrill Raring, PA-C  predniSONE (STERAPRED UNI-PAK 21 TAB) 10 MG (21) TBPK tablet Take 6 tabs for 2 days, then 5 for 2 days, then 4 for 2 days, then 3 for 2 days, 2 for 2 days, then 1 for 2 days 07/24/20   Isla Pence, MD  pregabalin (LYRICA) 75 MG capsule Take 75 mg by mouth 2 (two) times daily.     [provider]  rosuvastatin (CRESTOR) 20 MG tablet Take 20 mg by mouth daily.  10/14/17   [provider]  potassium chloride (K-DUR) 10 MEQ tablet Take 1 tablet (10 mEq total) by mouth daily. 10/11/18 01/06/19  Petrucelli, Samantha R,  PA-C      Allergies    Clindamycin/lincomycin, Ibuprofen, Tomato, and Vicodin [hydrocodone-acetaminophen]    Review of Systems   Review of Systems  Respiratory:  Positive for shortness of breath.   Cardiovascular:  Positive for chest pain.   Physical Exam Updated Vital Signs BP 105/65 (BP Location: Right Arm)    Pulse 67    Temp 97.8 F (36.6 C) (Oral)    Resp 18    Ht 5' (1.524 m)    Wt 118.8 kg    SpO2 100%    BMI 51.17 kg/m  Physical Exam Vitals and nursing note reviewed. Exam conducted with a chaperone present.  Constitutional:      Appearance: Normal appearance.     Comments: BMI 51.17  HENT:     Head: Normocephalic and atraumatic.  Eyes:     General: No scleral icterus.       Right eye: No discharge.        Left eye: No discharge.     Extraocular Movements: Extraocular movements intact.     Pupils: Pupils are equal, round, and reactive to light.  Cardiovascular:     Rate and Rhythm: Normal rate and regular rhythm.     Pulses: Normal pulses.     Heart sounds: Normal heart sounds. No murmur heard.   No friction rub. No gallop.  Pulmonary:     Effort: Pulmonary effort is normal. No respiratory distress.     Breath sounds: Normal breath sounds.     Comments: Lungs are clear to auscultation bilaterally Chest:     Chest wall: Tenderness present.     Comments: Diffuse chest wall tenderness to palpation Abdominal:     General: Abdomen is flat. Bowel sounds are normal. There is no distension.     Palpations: Abdomen is soft.     Tenderness: There is no abdominal tenderness.  Skin:    General: Skin is warm and dry.     Coloration: Skin is not jaundiced.  Neurological:     Mental Status: She is alert. Mental status is at baseline.     Coordination: Coordination normal.   ED Results / Procedures / Treatments   Labs (all labs ordered are listed, but only abnormal results are displayed) Labs Reviewed  BASIC METABOLIC PANEL - Abnormal; Notable for the following  components:      Result Value   Potassium 2.9 (*)    BUN 25 (*)    Creatinine, Ser 1.33 (*)    GFR, Estimated 48 (*)    All other components within normal limits  CBC - Abnormal; Notable for the following components:   WBC 12.6 (*)    Platelets 428 (*)  All other components within normal limits  RESP PANEL BY RT-PCR (FLU A&B, COVID) ARPGX2  PREGNANCY, URINE  TROPONIN I (HIGH SENSITIVITY)    EKG None  Radiology DG Chest Portable 1 View  Result Date: 05/10/2021 CLINICAL DATA:  Shortness of breath. EXAM: PORTABLE CHEST 1 VIEW COMPARISON:  Sep 03, 2020 FINDINGS: The heart size and mediastinal contours are within normal limits. Both lungs are clear. The visualized skeletal structures are unremarkable. IMPRESSION: No active disease. Electronically Signed   By: Fidela Salisbury M.D.   On: 05/10/2021 13:43    Procedures Procedures    Medications Ordered in ED Medications  potassium chloride SA (KLOR-CON M) CR tablet 40 mEq (40 mEq Oral Given 05/10/21 1613)    ED Course/ Medical Decision Making/ A&P                           Medical Decision Making  This is a 51 year old female with history of hypertension, mitral regurg, anemia, presenting due to chest pain x2 days.  It sounds musculoskeletal in nature, reproducible with palpation.  Unable to auscultate murmur, radial pulse 2+ bilaterally.  Lungs are clear to auscultation, she is not tachycardic or hypoxic.  Additional history:  Echo 01/21/2020 - Echo results reviewed. Normal LVEF 55-60%. Mild to moderate mitral regurgitation  cCT October 2021 with calcium score 0  Negative delta troponin, EKG without any ischemic findings.  BMP notable for hypokalemia, unchanged from 2 months prior.  No gross electrolyte derangement, no significant leukocytosis.  Results discussed with patient, she would prefer oral potassium rather than IV replenishment.  I think that is reasonable.  At this time I do not suspect ACS, dissection, pneumonia,  pneumothorax.  Presentation is most consistent costochondritis, discussed with patient as well as return precautions.  She will follow-up with her cardiologist in the next week.  Discharged in stable condition.        Final Clinical Impression(s) / ED Diagnoses Final diagnoses:  Chest wall pain    Rx / DC Orders ED Discharge Orders          Ordered    potassium chloride SA (KLOR-CON M) 20 MEQ tablet  Daily,   Status:  Discontinued        05/10/21 1630    potassium chloride SA (KLOR-CON M) 20 MEQ tablet  Daily        05/10/21 1631              Sherrill Raring, PA-C 05/10/21 2334    Tegeler, Gwenyth Allegra, MD 05/10/21 [phone]

## 2021-05-10 NOTE — Discharge Instructions (Addendum)
Take the potassium as prescribed.  Follow-up with your primary in a week for potassium recheck.  Emergency was reassuring, no signs of heart attack or underlying pneumonia.  You were negative for flu and COVID.

## 2021-05-10 NOTE — ED Notes (Signed)
Attempted IV x 1; pt sts she is a very hard stick and would prefer to just do oral K+; EDP notified and is agreeable.

## 2021-05-10 NOTE — ED Triage Notes (Signed)
Pt arrives ambulatory to ED with reports of SOB and CP X2 days. States the CP is in the whole chest with movement or cough. Pt reports non productive cough.

## 2021-05-10 NOTE — ED Notes (Signed)
Pt not in room.

## 2021-05-22 IMAGING — CT CT HEART MORP W/ CTA COR W/ SCORE W/ CA W/CM &/OR W/O CM
4 of 7 series · 8 of 20 positions shown, 9 images · IV contrast (APPLIED)
Comparison: None.
COMPARISON: None.

Addendum:
EXAM:
OVER-READ INTERPRETATION  CT CHEST

The following report is an over-read performed by radiologist Dr.
Chibuxa Lamparadze [[email or web address]] on 01/31/2020. This
over-read does not include interpretation of cardiac or coronary
anatomy or pathology. The coronary calcium score/coronary CTA
interpretation by the cardiologist is attached.
CLINICAL DATA: Chest pain
Cardiac/Coronary CTA
TECHNIQUE: The patient was scanned on a Phillips Force scanner. A 100 kV
prospective scan was triggered in the descending thoracic aorta at
111 HU's. Axial non-contrast 3 mm slices were carried out through
the heart. The data set was analyzed on a dedicated work station and
scored using the Agatson method. Gantry rotation speed was 250 msecs
and collimation was .6 mm. No beta blockade and 0.8 mg of sl NTG was
given. The 3D data set was reconstructed in 5% intervals of the
35-75 % of the R-R cycle. Diastolic phases were analyzed on a
dedicated work station using MPR, MIP and VRT modes. The patient
received 80 cc of contrast.

[Series 6: best diast 74 % · axial · 0.40mm/px · z∈[+1066,+1105]mm · 2 of 296 slices shown, 3 images]
[im 99/296  vessel]
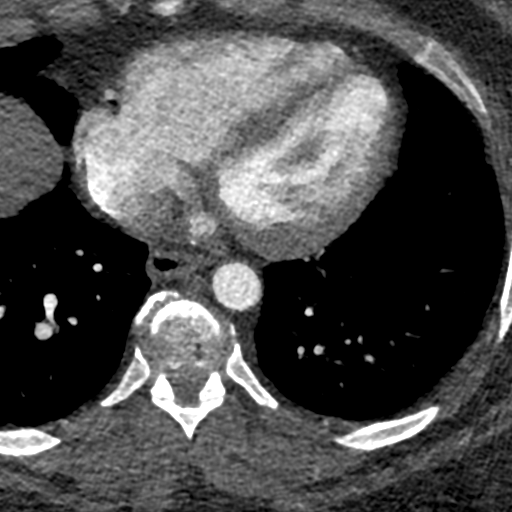
[im 99/296  lung]
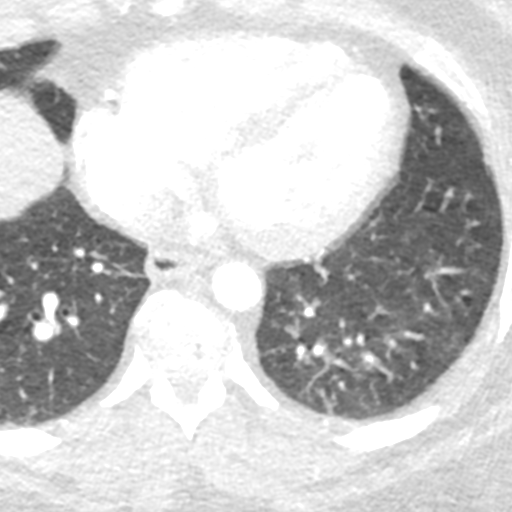
[im 197/296  vessel]
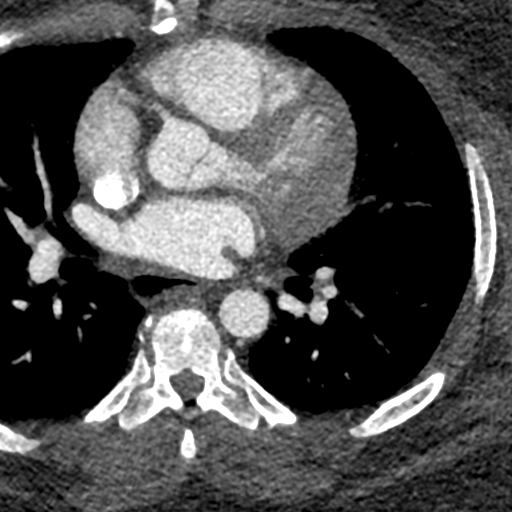

[Series 7: best syst 36 % · axial · 0.40mm/px · z∈[+1066,+1105]mm · 2 of 296 slices shown]
[im 99/296  vessel]
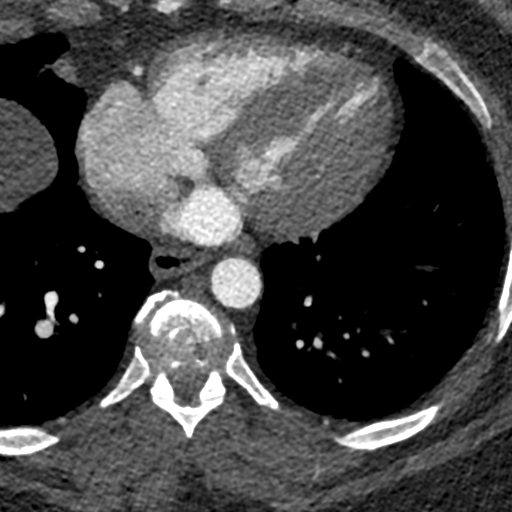
[im 197/296  vessel]
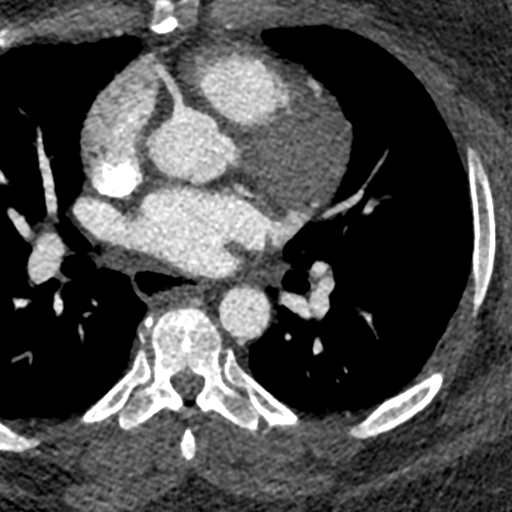

[Series 8: ts diast sharp 74 % · axial · 0.40mm/px · z∈[+1066,+1105]mm · 2 of 296 slices shown]
[im 99/296  lung]
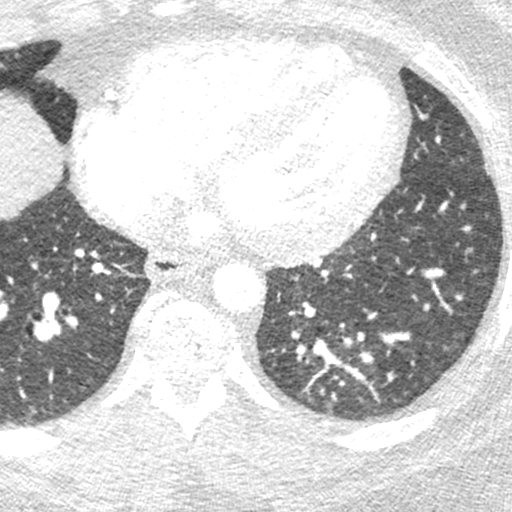
[im 197/296  lung]
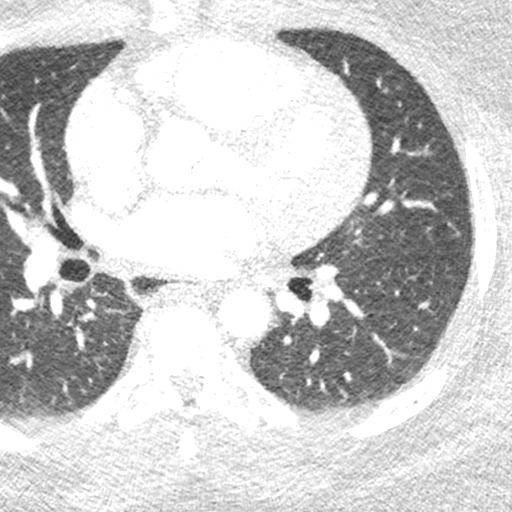

[Series 9: ts syst sharp 36 % · axial · 0.40mm/px · z∈[+1066,+1105]mm · 2 of 296 slices shown]
[im 99/296  lung]
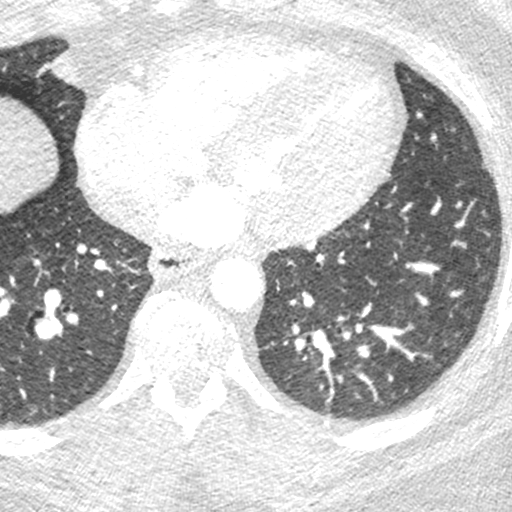
[im 197/296  lung]
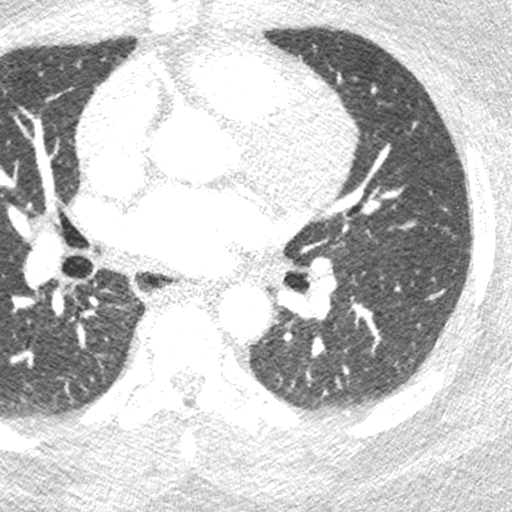

[8 of 20 positions shown; findings below may reference images not displayed]

FINDINGS: Within the visualized portions of the thorax there are no suspicious
appearing pulmonary nodules or masses, there is no acute
consolidative airspace disease, no pleural effusions, no
pneumothorax and no lymphadenopathy. Visualized portions of the
upper abdomen are unremarkable. There are no aggressive appearing
lytic or blastic lesions noted in the visualized portions of the
skeleton.
IMPRESSION: No significant incidental noncardiac findings are noted.
FINDINGS: Image quality: Average.

Noise artifact is: Moderate signal to noise artifact is present due
to obesity (BMI 55).

Coronary Arteries:  Normal coronary origin.  Right dominance.

Left main: The left main is a large caliber vessel with a normal
take off from the left coronary cusp that bifurcates to form a left
anterior descending artery and a left circumflex artery. There is no
plaque or stenosis.

Left anterior descending artery: The LAD is patent without evidence
of plaque or stenosis. The LAD gives off 3 patent diagonal branches.
There is limited evaluation of the distal segments due to obesity
artifact but these appear normal.

Left circumflex artery: The LCX is non-dominant and patent with no
evidence of plaque or stenosis. Limited evaluation of the distal LCX
due to obesity artifact.

Right coronary artery: The RCA is dominant with normal take off from
the right coronary cusp. There is no evidence of plaque or stenosis.
The RCA terminates as a PDA and right posterolateral branch without
evidence of plaque or stenosis. Again, the distal segments appear
normal but there is obesity artifact.

Right Atrium: Right atrial size is within normal limits.

Right Ventricle: The right ventricular cavity is within normal
limits.

Left Atrium: Left atrial size is normal in size with no left atrial
appendage filling defect.

Left Ventricle: The ventricular cavity size is within normal limits.
There are no stigmata of prior infarction. There is no abnormal
filling defect.

Pulmonary arteries: Normal in size without proximal filling defect.

Pulmonary veins: Normal pulmonary venous drainage.

Pericardium: Normal thickness with no significant effusion or
calcium present.

Cardiac valves: The aortic valve is trileaflet without significant
calcification. The mitral valve is normal structure without
significant calcification.

Aorta: Normal caliber with no significant disease.

Extra-cardiac findings: See attached radiology report for
non-cardiac structures.
IMPRESSION: 1. Coronary calcium score of 0.

2. Normal coronary origin with right dominance.

3. There is severe signal to noise artifact related to obesity (BMI
55) but the coronary arteries appear to be normal without evidence
of plaque or stenosis.

4. Limited evaluation of the distal coronary segments.

RECOMMENDATIONS:
1. No evidence of CAD (0%). Consider non-atherosclerotic causes of
chest pain.

*** End of Addendum ***
EXAM:
OVER-READ INTERPRETATION  CT CHEST

The following report is an over-read performed by radiologist Dr.
Chibuxa Lamparadze [[email or web address]] on 01/31/2020. This
over-read does not include interpretation of cardiac or coronary
anatomy or pathology. The coronary calcium score/coronary CTA
interpretation by the cardiologist is attached.
FINDINGS: Within the visualized portions of the thorax there are no suspicious
appearing pulmonary nodules or masses, there is no acute
consolidative airspace disease, no pleural effusions, no
pneumothorax and no lymphadenopathy. Visualized portions of the
upper abdomen are unremarkable. There are no aggressive appearing
lytic or blastic lesions noted in the visualized portions of the
skeleton.
IMPRESSION: No significant incidental noncardiac findings are noted.

## 2021-06-09 NOTE — Progress Notes (Signed)
Cardiology Office Note:    Date:  06/10/2021   ID:  Gerhard Perches Skowron, DOB [birth year], MRN [id number]  PCP:  Pcp, No  [facility] Cardiologist:  Freada Bergeron, MD  [facility] Electrophysiologist:  None   Referring MD: No ref. provider found   History of Present Illness:    Felicia Molina is a 51 y.o. female with a hx of HTN, HLD, fibromyalgia and obesity who presents to clinic for follow-up.  The patient was seen in [facility] ER on 01/08/20 for episode of sharp, left-sided chest pain.  ECG without ischemic changes. Trop-I <0.01, CK-MB 2, D-dimer 285. Her pain subsided without intervention, however, given her risk factors, she was recommended for stress testing.Unfortantely, she had to leave prior to testing as she had her daughter with her and did not have childcare available. She re-presented to [facility] ER on 09/15 with recurrence of her left sided and central chest pain. ECG here demonstrated sinus rhythm with low voltage in the precordial leads, prolonged Qtc but no evidence of ischemia.    She was seen in follow-up 09/21 where TTE with EF 55-60%, normal RV, mild-to-moderate MR. Coronary CTA 01/2020 with no significant disease. Ca score 0.  Today, the patient states she has been having significant chest pain whenever she gets angry or stressed out. No significant exertional symptoms. Had recent ER visit for chest pain thought to be due to muscle strain. Continues to have significant dyspnea on due to her asthma. No significant LE edema. No lightheadedness, dizziness or syncope. No sleep apnea and is not on CPAP. Suffering from frequent migraines. Has been compliant with medications. Blood pressure overall well controlled.  Past Medical History:  Diagnosis Date   Anemia    Asthma    Atypical chest pain 07/28/2018   Back pain, chronic    Fibromyalgia    Hypertension    Mitral regurgitation    Morbid obesity (HCC)    Neurogenic bladder    Pain management contract signed    Palpitations  07/28/2018   Panic attacks    Prolonged Q-T interval on ECG    Reflux     Past Surgical History:  Procedure Laterality Date   BACK SURGERY     bladder stimulator     CARPAL TUNNEL RELEASE     TONSILLECTOMY     URETERAL STENT PLACEMENT  2008    Current Medications: Current Meds  Medication Sig   albuterol (VENTOLIN HFA) 108 (90 Base) MCG/ACT inhaler Inhale 2 puffs into the lungs every 4 (four) hours as needed for wheezing or shortness of breath.   atenolol-chlorthalidone (TENORETIC) 100-25 MG tablet Take 1 tablet by mouth daily.    budesonide-formoterol (SYMBICORT) 160-4.5 MCG/ACT inhaler Inhale 2 puffs into the lungs 2 (two) times daily.   buPROPion (WELLBUTRIN SR) 150 MG 12 hr tablet Take 150 mg by mouth 2 (two) times daily.    cetirizine (ZYRTEC) 10 MG tablet Take 10 mg by mouth daily.    cyclobenzaprine (FLEXERIL) 10 MG tablet Take 10 mg by mouth 3 (three) times daily as needed. Takes for muscle spasms   diclofenac sodium (VOLTAREN) 1 % GEL Apply 2 g topically 4 (four) times daily.   fluticasone (FLONASE) 50 MCG/ACT nasal spray 2 SPRAYS INTO EACH NOSTRIL DAILY   furosemide (LASIX) 40 MG tablet Take 1 tablet (40 mg total) by mouth daily.   hydrALAZINE (APRESOLINE) 100 MG tablet Take 100 mg by mouth 2 (two) times daily.   ipratropium (ATROVENT) 0.06 % nasal spray  Place 2 sprays into the nose 4 (four) times daily.   isosorbide mononitrate (IMDUR) 30 MG 24 hr tablet Take 0.5 tablets (15 mg total) by mouth daily.   losartan (COZAAR) 100 MG tablet TK 1 T PO DAILY   metoprolol tartrate (LOPRESSOR) 100 MG tablet Take 100mg  tablet 2 hours prior to your cardiac CT scan.   montelukast (SINGULAIR) 10 MG tablet Take 10 mg by mouth at bedtime.   omeprazole (PRILOSEC) 40 MG capsule Take 40 mg by mouth daily.   ondansetron (ZOFRAN) 4 MG tablet Take 1 tablet (4 mg total) by mouth every 8 (eight) hours as needed for nausea or vomiting.   potassium chloride SA (KLOR-CON M) 20 MEQ tablet Take 2  tablets (40 mEq total) by mouth daily.   predniSONE (STERAPRED UNI-PAK 21 TAB) 10 MG (21) TBPK tablet Take 6 tabs for 2 days, then 5 for 2 days, then 4 for 2 days, then 3 for 2 days, 2 for 2 days, then 1 for 2 days   pregabalin (LYRICA) 75 MG capsule Take 75 mg by mouth 2 (two) times daily.    rosuvastatin (CRESTOR) 20 MG tablet Take 20 mg by mouth daily.      Allergies:   Clindamycin/lincomycin, Ibuprofen, Tomato, and Vicodin [hydrocodone-acetaminophen]   Social History   Socioeconomic History   Marital status: Single    Spouse name: Not on file   Number of children: 4   Years of education: Not on file   Highest education level: Not on file  Occupational History   Not on file  Tobacco Use   Smoking status: Former   Smokeless tobacco: Never  Vaping Use   Vaping Use: Never used  Substance and Sexual Activity   Alcohol use: No   Drug use: No   Sexual activity: Not on file  Other Topics Concern   Not on file  Social History Narrative   Not on file   Social Determinants of Health   Financial Resource Strain: Not on file  Food Insecurity: Not on file  Transportation Needs: Not on file  Physical Activity: Not on file  Stress: Not on file  Social Connections: Not on file     Family History: The patient's *family history includes Heart attack in her father and mother; Heart disease in her father and sister.  ROS:   Please see the history of present illness.    Reports some migraine headaches and body aches. No fevers, chills, nausea or vomiting. No lightheadedness or dizziness. No syncope.   EKGs/Labs/Other Studies Reviewed:    The following studies were reviewed today: Coronary CTA 01/2020: FINDINGS: Image quality: Average.   Noise artifact is: Moderate signal to noise artifact is present due to obesity (BMI 55).   Coronary Arteries:  Normal coronary origin.  Right dominance.   Left main: The left main is a large caliber vessel with a normal take off from the  left coronary cusp that bifurcates to form a left anterior descending artery and a left circumflex artery. There is no plaque or stenosis.   Left anterior descending artery: The LAD is patent without evidence of plaque or stenosis. The LAD gives off 3 patent diagonal branches. There is limited evaluation of the distal segments due to obesity artifact but these appear normal.   Left circumflex artery: The LCX is non-dominant and patent with no evidence of plaque or stenosis. Limited evaluation of the distal LCX due to obesity artifact.   Right coronary artery: The RCA is  dominant with normal take off from the right coronary cusp. There is no evidence of plaque or stenosis. The RCA terminates as a PDA and right posterolateral branch without evidence of plaque or stenosis. Again, the distal segments appear normal but there is obesity artifact.   Right Atrium: Right atrial size is within normal limits.   Right Ventricle: The right ventricular cavity is within normal limits.   Left Atrium: Left atrial size is normal in size with no left atrial appendage filling defect.   Left Ventricle: The ventricular cavity size is within normal limits. There are no stigmata of prior infarction. There is no abnormal filling defect.   Pulmonary arteries: Normal in size without proximal filling defect.   Pulmonary veins: Normal pulmonary venous drainage.   Pericardium: Normal thickness with no significant effusion or calcium present.   Cardiac valves: The aortic valve is trileaflet without significant calcification. The mitral valve is normal structure without significant calcification.   Aorta: Normal caliber with no significant disease.   Extra-cardiac findings: See attached radiology report for non-cardiac structures.   IMPRESSION: 1. Coronary calcium score of 0.   2. Normal coronary origin with right dominance.   3. There is severe signal to noise artifact related to obesity  (BMI 55) but the coronary arteries appear to be normal without evidence of plaque or stenosis.   4. Limited evaluation of the distal coronary segments.   RECOMMENDATIONS: 1. No evidence of CAD (0%). Consider non-atherosclerotic causes of chest pain. TTE 12/2019: IMPRESSIONS   1. Poor acoustic windows limit study.   2. Global longitudinal strain is -19.2%. Left ventricular ejection  fraction, by estimation, is 55 to 60%. The left ventricle has normal  function. The left ventricle has no regional wall motion abnormalities.  The left ventricular internal cavity size was   mildly dilated. Left ventricular diastolic parameters were normal.   3. Right ventricular systolic function is normal. The right ventricular  size is normal. There is normal pulmonary artery systolic pressure.   4. MR jet is eccentric, directed posterior into LA. Mild to moderate  mitral valve regurgitation.   5. Mild aortic valve sclerosis is present, with no evidence of aortic  valve stenosis.   6. The inferior vena cava is normal in size with greater than 50%  respiratory variability, suggesting right atrial pressure of 3 mmHg.   12/24/2016: Lexiscan myoview stress test 12/24/2016: 1. This was a 2-day protocol.  The resting electrocardiogram demonstrated normal sinus rhythm, normal resting conduction and no resting arrhythmias. Low voltage.   Stress EKG is non-diagnostic for ischemia as it a pharmacologic stress using Lexiscan. Stress symptoms included dyspnea, nausea and dizziness.  2. SPECT images demonstrate small perfusion abnormality of mild intensity in the apical myocardial wall(s) on the stress images.  These defects are related to apical thinning.  Gated SPECT images reveal normal myocardial thickening and wall motion.  The left ventricular ejection fraction was calculated or visually estimated to be 53%.  This is a low risk study.   EKG:  EKG 06/10/21 NSR, late r-wave progression, no ischemia.   Recent  Labs: 05/10/2021: BUN 25; Creatinine, Ser 1.33; Hemoglobin 13.0; Platelets 428; Potassium 2.9; Sodium 139  Recent Lipid Panel No results found for: CHOL, TRIG, HDL, CHOLHDL, VLDL, LDLCALC, LDLDIRECT  Physical Exam:    VS:  BP 134/74    Pulse 88    Ht 5' (1.524 m)    Wt 268 lb 6.4 oz (121.7 kg)    SpO2 95%  BMI 52.42 kg/m     Wt Readings from Last 3 Encounters:  06/10/21 268 lb 6.4 oz (121.7 kg)  05/10/21 262 lb (118.8 kg)  01/08/21 281 lb 1.4 oz (127.5 kg)     GEN: Obese female, comfortable HEENT: Normal NECK: No JVD; No carotid bruits CARDIAC: RRR, no murmurs, rubs, gallops RESPIRATORY:  Clear to auscultation without rales, wheezing or rhonchi  ABDOMEN: Obese, soft, non-tender, non-distended MUSCULOSKELETAL:  Trace pedal edema; No deformity  SKIN: Warm and dry NEUROLOGIC:  Alert and oriented x 3 PSYCHIATRIC:  Normal affect   ASSESSMENT:    1. Dyspnea, unspecified type   2. Essential hypertension   3. Pure hypercholesterolemia   4. Morbid obesity with BMI of 50.0-59.9, adult ([place])   5. Atypical chest pain     PLAN:    In order of problems listed above:  #Dyspnea on Exertion: Reassuring work-up with normal coronary CTA. TTE with LVEF 55-60%, mild-to-moderate MR. Likely related to asthma. -Related to underlying asthma  #Chest Pain: Noncardiac in nature. Possible spasm as she reports she did feel better with nitro during ER visit. Will start low dose imdur to see if helps with symptoms. Also with severe asthma which may be contributing as well as chest wall pain. -Start imdur -Management of asthma per primary  #Hypertension: Well controlled today. Managed by PCP. -Cotinue atenolol-chlorthalidone 100-25 -Continue hydralazine 100mg  BID -Continue losartan 100mg  daily  #Hyperlipidemia -On crestor 20mg  daily -Management per PCP  #Morbid Obesity: BMI 52.  -Extensive counseling regarding the importance of weight loss as likely contributing to the pain the joint  pain and MSK pain she is having. She is working on diet. Exercise is limited due to SOB but she is trying to be more active. Will continue to encourage lifestyle changes. -Will look into GLP-1 agonist   Medication Adjustments/Labs and Tests Ordered: Current medicines are reviewed at length with the patient today.  Concerns regarding medicines are outlined above.  Orders Placed This Encounter  Procedures   EKG 12-Lead   Meds ordered this encounter  Medications   isosorbide mononitrate (IMDUR) 30 MG 24 hr tablet    Sig: Take 0.5 tablets (15 mg total) by mouth daily.    Dispense:  45 tablet    Refill:  3    Patient Instructions  Medication Instructions:   START TAKING ISOSORBIDE  MONONITRATE (IMDUR) 15 MG BY MOUTH DAILY  *If you need a refill on your cardiac medications before your next appointment, please call your pharmacy*   Follow-Up: At [facility], you and your health needs are our priority.  As part of our continuing mission to provide you with exceptional heart care, we have created designated Provider Care Teams.  These Care Teams include your primary Cardiologist (physician) and Advanced Practice Providers (APPs -  Physician Assistants and Nurse Practitioners) who all work together to provide you with the care you need, when you need it.  We recommend signing up for the patient portal called "MyChart".  Sign up information is provided on this After Visit Summary.  MyChart is used to connect with patients for Virtual Visits (Telemedicine).  Patients are able to view lab/test results, encounter notes, upcoming appointments, etc.  Non-urgent messages can be sent to your provider as well.   To learn more about what you can do with MyChart, go to [email or web address].    Your next appointment:   1 year(s)  The format for your next appointment:   In Person  Provider:   Nira Conn  Renae Fickle, MD {      Signed, Freada Bergeron, MD  06/10/2021 3:52 PM    [place]

## 2021-06-10 ENCOUNTER — Ambulatory Visit (INDEPENDENT_AMBULATORY_CARE_PROVIDER_SITE_OTHER): Payer: Medicaid Other | Admitting: Cardiology

## 2021-06-10 ENCOUNTER — Other Ambulatory Visit: Payer: Self-pay

## 2021-06-10 ENCOUNTER — Encounter: Payer: Self-pay | Admitting: Cardiology

## 2021-06-10 VITALS — BP 134/74 | HR 88 | Ht 60.0 in | Wt 268.4 lb

## 2021-06-10 DIAGNOSIS — R0789 Other chest pain: Secondary | ICD-10-CM

## 2021-06-10 DIAGNOSIS — I10 Essential (primary) hypertension: Secondary | ICD-10-CM

## 2021-06-10 DIAGNOSIS — E7800 Pure hypercholesterolemia, unspecified: Secondary | ICD-10-CM

## 2021-06-10 DIAGNOSIS — Z6841 Body Mass Index (BMI) 40.0 and over, adult: Secondary | ICD-10-CM

## 2021-06-10 DIAGNOSIS — R0600 Dyspnea, unspecified: Secondary | ICD-10-CM | POA: Diagnosis not present

## 2021-06-10 MED ORDER — ISOSORBIDE MONONITRATE ER 30 MG PO TB24: 15.0000 mg | ORAL_TABLET | Freq: Every day | ORAL | 3 refills | Status: DC

## 2021-06-10 NOTE — Patient Instructions (Signed)
Medication Instructions:   START TAKING ISOSORBIDE  MONONITRATE (IMDUR) 15 MG BY MOUTH DAILY  *If you need a refill on your cardiac medications before your next appointment, please call your pharmacy*   Follow-Up: At [facility], you and your health needs are our priority.  As part of our continuing mission to provide you with exceptional heart care, we have created designated Provider Care Teams.  These Care Teams include your primary Cardiologist (physician) and Advanced Practice Providers (APPs -  Physician Assistants and Nurse Practitioners) who all work together to provide you with the care you need, when you need it.  We recommend signing up for the patient portal called "MyChart".  Sign up information is provided on this After Visit Summary.  MyChart is used to connect with patients for Virtual Visits (Telemedicine).  Patients are able to view lab/test results, encounter notes, upcoming appointments, etc.  Non-urgent messages can be sent to your provider as well.   To learn more about what you can do with MyChart, go to [email or web address].    Your next appointment:   1 year(s)  The format for your next appointment:   In Person  Provider:   Freada Bergeron, MD {

## 2021-06-11 ENCOUNTER — Telehealth: Payer: Self-pay | Admitting: Pharmacist

## 2021-06-11 NOTE — Telephone Encounter (Signed)
Received referral from Dr Johney Frame to initiate [id number] therapy for weight loss. She has Medicaid which does not cover VGKKDP. Her insurance covers Ozempic but only if she has a dx of DM which she does not. Called pt to advise her that her insurance will not cover GLP for weight loss at this time. She plans to look into other insurance plans to see if they will cover Ozempic.

## 2021-06-30 ENCOUNTER — Telehealth: Payer: Self-pay | Admitting: Cardiology

## 2021-06-30 NOTE — Telephone Encounter (Signed)
Will route this message to our Pharmacist and Dr. Johney Frame, to further advise on this matter. ?

## 2021-06-30 NOTE — Telephone Encounter (Signed)
Tried reaching out to the pt again and she did answer this time, and replied "can I call you later." ?Will await pt call back if assistance still needed.   ?Note below is in reference to 2/16 call our Pharmacist had with the pt discussing non-coverage from [facility] for weight loss medications discussed at last OV with Dr. Johney Frame.  ? ? ?Supple, Harlon Flor, RPH-CPP ?  ?  10:41 AM ?Note ?Received referral from Dr Johney Frame to initiate [id number] therapy for weight loss. She has Medicaid which does not cover QWQVLD. Her insurance covers Ozempic but only if she has a dx of DM which she does not. Called pt to advise her that her insurance will not cover GLP for weight loss at this time. She plans to look into other insurance plans to see if they will cover Ozempic.  ?  ? ? ?

## 2021-06-30 NOTE — Telephone Encounter (Signed)
Patient returning call.

## 2021-06-30 NOTE — Telephone Encounter (Signed)
Pt called back to provide new information and numbers to give to [facility], with new insurance coverage she picked up. ? ?Pt is asking to provide this information to [employer] PharmD, to see if she could try calling the [facility] or Ozempic in to see if this will get covered under new plan. ? ? ?Pt states she now has Medicaid Part A and B.  ? ?She states that Medicaid advised her to have our office call the prescription line for Medicaid A/B and provide them the medications we suggested for weight loss and see if this will be covered now under new plan. ? ?Pt states the prescription line to call is [phone] ? ?She states her new Medicaid # is [id number] ? ?She reports she is no longer under Medicaid [employer] anymore, she is now under [place].  She states her Medical Health number is [phone] ? ? ?Pt super apologetic for the back and forth and having to provide all this information, but would greatly appreciate if we could try calling the prescription line and using this information, to see if new carrier with medicaid will approve either/or weight loss medication.  ? ? ?Informed the pt that I will route this information to [facility] to see if she could possibly assist with this again.  She is aware that she may not hear anything back until tomorrow. ?Pt verbalized understanding and agrees with this plan.  Pt was more than gracious for all the assistance provided.  ?

## 2021-06-30 NOTE — Telephone Encounter (Signed)
Left the pt a message to call the office back. 

## 2021-06-30 NOTE — Telephone Encounter (Signed)
See phone note 2/16 for details. Medicaid does not cover any medications for weight loss, and will not cover [place] therapy since she does not have diabetes. ?

## 2021-06-30 NOTE — Telephone Encounter (Signed)
Patient calling back.   °

## 2021-06-30 NOTE — Telephone Encounter (Signed)
Spoke with the pt and she states she has another Buyer, retail and card to report to [place], to see if this would cover the weight loss injections.  ?Pt states she is going home now and will get her new card out and call us back here shortly to report this to [place].   ?Will await pt call back and run this by our PharmD team thereafter.  ?

## 2021-06-30 NOTE — Telephone Encounter (Signed)
Patient is calling back in regards to the previous conversation she had with [facility] regarding the shot Dr. Johney Frame was wanting her to start on that is not covered by her insurance. She would like a callback to discuss the alternatives that are covered by her insurance. Please advise.  ?

## 2021-07-01 NOTE — Telephone Encounter (Signed)
Spoke with the pt and went over information from [employer] PharmD, about non-coverage of any weight loss med with Medicaid, Medicaid A/B. ?Pt verbalized understanding and agrees with this plan.  Pt states she is going to try and pick up another supplemental, and will touch base with our office in a couple weeks, if this occurs.  ?Pt was appreciative for all the assistance provided.  ?

## 2021-07-01 NOTE — Telephone Encounter (Signed)
Left the pt a message to call the office back to endorse information from [facility] PharmD. ?

## 2021-07-01 NOTE — Telephone Encounter (Signed)
A and B covers hospital and office visits, not prescriptions. I do see a different card listed under the verify rx benefits tab but it's still a Medicaid plan which doesn't cover weight loss meds like Wegovy. Confirmed that this plan does not cover Ozempic for off label use either (requires DM dx and pt to be on metformin already, pt does not have DM). Still would be unable to start weight loss medication unfortunately. ?

## 2021-09-29 ENCOUNTER — Encounter: Payer: Medicaid Other | Admitting: Obstetrics and Gynecology

## 2021-11-17 ENCOUNTER — Encounter: Payer: Self-pay | Admitting: Obstetrics and Gynecology

## 2021-11-17 ENCOUNTER — Ambulatory Visit: Payer: Medicaid Other | Admitting: Obstetrics and Gynecology

## 2021-11-17 ENCOUNTER — Other Ambulatory Visit (HOSPITAL_COMMUNITY)
Admission: RE | Admit: 2021-11-17 | Discharge: 2021-11-17 | Disposition: A | Discharge status: Home / Self Care | Payer: Medicaid Other | Source: Ambulatory Visit | Attending: Obstetrics and Gynecology | Admitting: Obstetrics and Gynecology

## 2021-11-17 ENCOUNTER — Telehealth: Payer: Self-pay | Admitting: *Deleted

## 2021-11-17 VITALS — BP 170/96 | HR 91 | Ht 60.0 in | Wt 269.0 lb

## 2021-11-17 DIAGNOSIS — N814 Uterovaginal prolapse, unspecified: Secondary | ICD-10-CM

## 2021-11-17 DIAGNOSIS — I10 Essential (primary) hypertension: Secondary | ICD-10-CM | POA: Diagnosis not present

## 2021-11-17 DIAGNOSIS — Z124 Encounter for screening for malignant neoplasm of cervix: Secondary | ICD-10-CM | POA: Diagnosis present

## 2021-11-17 DIAGNOSIS — N912 Amenorrhea, unspecified: Secondary | ICD-10-CM | POA: Diagnosis not present

## 2021-11-17 DIAGNOSIS — R32 Unspecified urinary incontinence: Secondary | ICD-10-CM

## 2021-11-17 LAB — POCT URINE PREGNANCY: Preg Test, Ur: NEGATIVE

## 2021-11-17 MED ORDER — FLUCONAZOLE 150 MG PO TABS: 150.0000 mg | ORAL_TABLET | Freq: Every day | ORAL | 0 refills | Status: AC

## 2021-11-17 NOTE — Progress Notes (Signed)
GYNECOLOGY ENCOUNTER NOTE  History:     Felicia Molina is a 51 y.o. [id number] female here with concerns about pregnancy. She initially made her appointment complaining of a bulge in her vagina. However during her appointment reports that she had a positive pregnancy test at home, and desires a pregnancy. She has a history of fibroid tumors, uncontrolled HTN, morbid obesity, mitral regurgitation. She reports feeling movement in her abdomen today. Last pelvic US to evaluate fibroids was in 2019. Her and her partner desire pregnancy, she is not interested in [facility] at this time. Her youngest child is 72; he is present with her today in the office.   Currently taking 3 blood pressure medications. Has not seen her PCP in 2 years. Last BP medication was taken this morning.  She has no chest pain, no headaches.   On July 15th she reports a positive home pregnancy test. Her last period was April 11-15, she does have a period every month. She would like a pregnancy test today. She is also due for a pap smear. She denies urinary complaints, or bladder/ bowel concerns.    Gynecologic History Patient's last menstrual period was 10/11/2021 (approximate). Contraception: none Last Pap: NA Last Mammogram: 2020- Normal  Last Colonoscopy: NA  Obstetric History OB History  Gravida Para Term Preterm AB Living  '6 4 4 '$ 0 2 4  SAB IAB Ectopic Multiple Live Births  2 0 0 0 4    # Outcome Date GA Lbr Len/2nd Weight Sex Delivery Anes PTL Lv  6 Term 12/25/88    F Vag-Spont   LIV  5 Term 11/28/87    M Vag-Spont   LIV  4 Term 05/17/86    M Vag-Spont   LIV  3 Term 01/24/86    M Vag-Spont   LIV  2 SAB           1 SAB             Past Medical History:  Diagnosis Date   Anemia    Asthma    Atypical chest pain 07/28/2018   Back pain, chronic    Fibromyalgia    Hypertension    Mitral regurgitation    Morbid obesity ([place])    Neurogenic bladder    Pain management contract signed    Palpitations 07/28/2018   Panic  attacks    Prolonged Q-T interval on ECG    Reflux     Past Surgical History:  Procedure Laterality Date   BACK SURGERY     bladder stimulator     CARPAL TUNNEL RELEASE     TONSILLECTOMY     URETERAL STENT PLACEMENT  2008    Current Outpatient Medications on File Prior to Visit  Medication Sig Dispense Refill   albuterol (VENTOLIN HFA) 108 (90 Base) MCG/ACT inhaler Inhale 2 puffs into the lungs every 4 (four) hours as needed for wheezing or shortness of breath. 6.7 g 0   atenolol-chlorthalidone (TENORETIC) 100-25 MG tablet Take 1 tablet by mouth daily.      budesonide-formoterol (SYMBICORT) 160-4.5 MCG/ACT inhaler Inhale 2 puffs into the lungs 2 (two) times daily.     buPROPion (WELLBUTRIN SR) 150 MG 12 hr tablet Take 150 mg by mouth 2 (two) times daily.      cetirizine (ZYRTEC) 10 MG tablet Take 10 mg by mouth daily.   3   cyclobenzaprine (FLEXERIL) 10 MG tablet Take 10 mg by mouth 3 (three) times daily as needed. Takes for muscle  spasms     diclofenac sodium (VOLTAREN) 1 % GEL Apply 2 g topically 4 (four) times daily.     fluticasone (FLONASE) 50 MCG/ACT nasal spray 2 SPRAYS INTO EACH NOSTRIL DAILY  5   furosemide (LASIX) 40 MG tablet Take 1 tablet (40 mg total) by mouth daily. 30 tablet 0   hydrALAZINE (APRESOLINE) 100 MG tablet Take 100 mg by mouth 2 (two) times daily.     ipratropium (ATROVENT) 0.06 % nasal spray Place 2 sprays into the nose 4 (four) times daily. 15 mL 1   isosorbide mononitrate (IMDUR) 30 MG 24 hr tablet Take 0.5 tablets (15 mg total) by mouth daily. 45 tablet 3   losartan (COZAAR) 100 MG tablet TK 1 T PO DAILY  3   metoprolol tartrate (LOPRESSOR) 100 MG tablet Take '100mg'$  tablet 2 hours prior to your cardiac CT scan. 1 tablet 0   montelukast (SINGULAIR) 10 MG tablet Take 10 mg by mouth at bedtime.     omeprazole (PRILOSEC) 40 MG capsule Take 40 mg by mouth daily.     ondansetron (ZOFRAN) 4 MG tablet Take 1 tablet (4 mg total) by mouth every 8 (eight) hours as  needed for nausea or vomiting. 12 tablet 0   potassium chloride SA (KLOR-CON M) 20 MEQ tablet Take 2 tablets (40 mEq total) by mouth daily. 20 tablet 0   predniSONE (STERAPRED UNI-PAK 21 TAB) 10 MG (21) TBPK tablet Take 6 tabs for 2 days, then 5 for 2 days, then 4 for 2 days, then 3 for 2 days, 2 for 2 days, then 1 for 2 days 42 tablet 0   pregabalin (LYRICA) 75 MG capsule Take 75 mg by mouth 2 (two) times daily.      rosuvastatin (CRESTOR) 20 MG tablet Take 20 mg by mouth daily.   3   [DISCONTINUED] potassium chloride (K-DUR) 10 MEQ tablet Take 1 tablet (10 mEq total) by mouth daily. 3 tablet 0   No current facility-administered medications on file prior to visit.    Allergies  Allergen Reactions   Clindamycin/Lincomycin Hives and Swelling   Ibuprofen Hives    Hives across the face   Tomato Hives   Vicodin [Hydrocodone-Acetaminophen] Hives    Social History:  reports that she has quit smoking. She has never used smokeless tobacco. She reports that she does not drink alcohol and does not use drugs.  Family History  Problem Relation Age of Onset   Heart attack Mother    Heart disease Father    Heart attack Father    Heart disease Sister     The following portions of the patient's history were reviewed and updated as appropriate: allergies, current medications, past family history, past medical history, past social history, past surgical history and problem list.  Review of Systems Pertinent items noted in HPI and remainder of comprehensive ROS otherwise negative.  Physical Exam:  BP (!) 170/96   Pulse 91   Ht 5' (1.524 m)   Wt 269 lb (122 kg)   LMP 10/11/2021 (Approximate)   BMI 52.54 kg/m  CONSTITUTIONAL: Well-developed, well-nourished female in no acute distress.  HENT:  Normocephalic, atraumatic, External right and left ear normal.  EYES: Conjunctivae and EOM are normal. Pupils are equal, round, and reactive to light. No scleral icterus.  NECK: Normal range of motion,  supple, no masses.  Normal thyroid.  SKIN: Skin is warm and dry. No rash noted. Not diaphoretic. No erythema. No pallor. MUSCULOSKELETAL: Normal range of motion.  No tenderness.  No cyanosis, clubbing, or edema. NEUROLOGIC: Alert and oriented to person, place, and time. Normal reflexes, muscle tone coordination.  PSYCHIATRIC: Normal mood and affect. Normal behavior. Normal judgment and thought content. CARDIOVASCULAR: Normal heart rate noted, regular rhythm RESPIRATORY: Clear to auscultation bilaterally. Effort and breath sounds normal, no problems with respiration noted. ABDOMEN: Soft, no distention noted.  No tenderness, rebound or guarding.  PELVIC: Normal appearing external genitalia and urethral meatus; cystocele noted, no pain with palpation. Normal appearing vaginal mucosa and cervix.  No abnormal vaginal discharge noted.  Pap smear obtained.  Normal uterine size, no other palpable masses, no uterine or adnexal tenderness.  Performed in the presence of a chaperone.   Assessment and Plan:   1. Amenorrhea  - POCT urine pregnancy- Negative  - US PELVIC COMPLETE WITH TRANSVAGINAL; Future - B-HCG Quant - HgB A1c - Discussed complications in pregnancy associated with chronic uncontrolled hypertension,  morbid obesity, AMA including high risk pregnancy, miscarriage, maternal death.   2. Screening for cervical cancer  - Cytology - PAP( [place]) - MM Digital Screening; Future  3. Uterine prolapse  - Ambulatory referral to Urogynecology  4. Uncontrolled hypertension  - Follow up with PCP, continue current medication regimen.  - Stroke precautions.    Rafeef Lau, Artist Pais, [place] for [employer], [place]

## 2021-11-17 NOTE — Telephone Encounter (Signed)
Returned call from 9:48 AM. Left patient an urgent message that I could not understand her voice message on the office phone. Patient returned call and stated that she will be at her appointment today at 2:50 with a 2:35 PM arrival time.

## 2021-11-18 LAB — HCG, QUANTITATIVE, PREGNANCY: HCG, Total, QN: <5 m[IU]/mL

## 2021-11-18 LAB — HEMOGLOBIN A1C
Hgb A1c MFr Bld: 5.5 % of total Hgb (ref <5.7)
Mean Plasma Glucose: 111 mg/dL
eAG (mmol/L): 6.2 mmol/L

## 2021-11-19 ENCOUNTER — Telehealth: Payer: Self-pay | Admitting: *Deleted

## 2021-11-19 ENCOUNTER — Other Ambulatory Visit: Payer: Medicaid Other

## 2021-11-19 LAB — CYTOLOGY - PAP
Adequacy: ABSENT
Comment: NEGATIVE
Diagnosis: NEGATIVE
High risk HPV: NEGATIVE

## 2021-11-19 NOTE — Telephone Encounter (Signed)
Returned call from from 9:19 AM. Left patient a message to call back.

## 2021-11-20 ENCOUNTER — Ambulatory Visit (INDEPENDENT_AMBULATORY_CARE_PROVIDER_SITE_OTHER): Payer: Medicaid Other

## 2021-11-20 DIAGNOSIS — N912 Amenorrhea, unspecified: Secondary | ICD-10-CM

## 2021-11-20 DIAGNOSIS — D259 Leiomyoma of uterus, unspecified: Secondary | ICD-10-CM

## 2021-11-26 ENCOUNTER — Telehealth: Payer: Self-pay | Admitting: *Deleted

## 2021-11-26 NOTE — Telephone Encounter (Signed)
Returned call from 9:26 AM. Left patient a message to set up [facility] and call office back.

## 2021-11-27 ENCOUNTER — Telehealth: Payer: Self-pay | Admitting: *Deleted

## 2021-11-27 NOTE — Telephone Encounter (Signed)
Returned call from 11/26/2021. Left patient a message to call the office back to let us know what she needs from the office.

## 2021-11-28 ENCOUNTER — Telehealth: Payer: Self-pay | Admitting: Obstetrics and Gynecology

## 2021-11-28 ENCOUNTER — Other Ambulatory Visit: Payer: Self-pay | Admitting: Obstetrics and Gynecology

## 2021-11-28 MED ORDER — CYCLOBENZAPRINE HCL 10 MG PO TABS
10.0000 mg | ORAL_TABLET | Freq: Three times a day (TID) | ORAL | 0 refills | Status: DC | PRN
Start: 2021-11-28 — End: 2022-02-22

## 2021-11-28 NOTE — Telephone Encounter (Signed)
Attempted to call Maeli to discuss [place] results. VM left another number to call as her phone is broken. Attempted to call [phone] left message to return call to [phone]- MAU.    Noni Saupe I, NP. 11/28/2021 3:35 PM

## 2021-11-28 NOTE — Progress Notes (Signed)
Discussed pelvic US with Casidee at 1745. Reviewed results in detail. She is having pain all over her abdomen and is interested in having the fibroids removed.  I will send a referral over to IR for possible uterine fibroids embolization.  Rx: Flexeril. Take extra strength tylenol Q 8 hours. Ok to use warm compress.   Felicia Molina 11/28/2021 7:47 PM

## 2021-11-30 ENCOUNTER — Other Ambulatory Visit: Payer: Self-pay

## 2021-11-30 DIAGNOSIS — D219 Benign neoplasm of connective and other soft tissue, unspecified: Secondary | ICD-10-CM

## 2021-11-30 NOTE — Progress Notes (Signed)
Ref to IR placed per Noni Saupe, NP

## 2021-12-06 NOTE — Progress Notes (Deleted)
Office Visit    Patient Name: Felicia Molina Date of Encounter: 12/06/2021  PCP:  Kathyrn Lass   [place]  Cardiologist:  Freada Bergeron, MD  Advanced Practice Provider:  No care team member to display Electrophysiologist:  None   Chief Complaint    Felicia Molina is a 51 y.o. female past medical history significant for HTN, HLD, fibromyalgia and obesity presents today for follow-up.  Patient's history includes ER visit to [facility] 01/08/2020 for episode of sharp, left-sided chest pain.  EKG without ischemic changes.  Troponin unremarkable.  Her pain subsided without any intervention, however, given her risk factors she was recommended for stress testing.  Unfortunately, she had to leave prior to testing.  She presented to the ER again 9/15 with recurrence of her left-sided and central chest pain.  EKG demonstrated sinus rhythm with low voltage in the pericardial leads, prolonged QTc but no evidence of ischemia.  She was seen in follow-up and TTE showed EF 55 to 60%, normal RV, mild to moderate MR.  Coronary CTA 10/21 with no significant disease.  Calcium score 0.  She was last seen February 2023 and had been having significant chest pain whenever she gets angry or stressed.  No significant exertional symptoms at this time.  She had a recent ER visit for chest pain but thought to be due to muscle strain.  She also continued to have significant dyspnea due to her asthma.  No significant lower extremity edema. BP well controlled.   Today, she ***  Past Medical History    Past Medical History:  Diagnosis Date   Anemia    Asthma    Atypical chest pain 07/28/2018   Back pain, chronic    Fibromyalgia    Hypertension    Mitral regurgitation    Morbid obesity ([facility])    Neurogenic bladder    Pain management contract signed    Palpitations 07/28/2018   Panic attacks    Prolonged Q-T interval on ECG    Reflux    Past Surgical History:  Procedure Laterality Date    BACK SURGERY     bladder stimulator     CARPAL TUNNEL RELEASE     TONSILLECTOMY     URETERAL STENT PLACEMENT  2008    Allergies  Allergies  Allergen Reactions   Clindamycin/Lincomycin Hives and Swelling   Ibuprofen Hives    Hives across the face   Tomato Hives   Vicodin [Hydrocodone-Acetaminophen] Hives   EKGs/Labs/Other Studies Reviewed:   The following studies were reviewed today:  Coronary CTA 01/2020: FINDINGS: Image quality: Average.   Noise artifact is: Moderate signal to noise artifact is present due to obesity (BMI 55).   Coronary Arteries:  Normal coronary origin.  Right dominance.   Left main: The left main is a large caliber vessel with a normal take off from the left coronary cusp that bifurcates to form a left anterior descending artery and a left circumflex artery. There is no plaque or stenosis.   Left anterior descending artery: The LAD is patent without evidence of plaque or stenosis. The LAD gives off 3 patent diagonal branches. There is limited evaluation of the distal segments due to obesity artifact but these appear normal.   Left circumflex artery: The LCX is non-dominant and patent with no evidence of plaque or stenosis. Limited evaluation of the distal LCX due to obesity artifact.   Right coronary artery: The RCA is dominant with normal take off from the  right coronary cusp. There is no evidence of plaque or stenosis. The RCA terminates as a PDA and right posterolateral branch without evidence of plaque or stenosis. Again, the distal segments appear normal but there is obesity artifact.   Right Atrium: Right atrial size is within normal limits.   Right Ventricle: The right ventricular cavity is within normal limits.   Left Atrium: Left atrial size is normal in size with no left atrial appendage filling defect.   Left Ventricle: The ventricular cavity size is within normal limits. There are no stigmata of prior infarction. There is no  abnormal filling defect.   Pulmonary arteries: Normal in size without proximal filling defect.   Pulmonary veins: Normal pulmonary venous drainage.   Pericardium: Normal thickness with no significant effusion or calcium present.   Cardiac valves: The aortic valve is trileaflet without significant calcification. The mitral valve is normal structure without significant calcification.   Aorta: Normal caliber with no significant disease.   Extra-cardiac findings: See attached radiology report for non-cardiac structures.   IMPRESSION: 1. Coronary calcium score of 0.   2. Normal coronary origin with right dominance.   3. There is severe signal to noise artifact related to obesity (BMI 55) but the coronary arteries appear to be normal without evidence of plaque or stenosis.   4. Limited evaluation of the distal coronary segments.   RECOMMENDATIONS: 1. No evidence of CAD (0%). Consider non-atherosclerotic causes of chest pain.  TTE 12/2019: IMPRESSIONS   1. Poor acoustic windows limit study.   2. Global longitudinal strain is -19.2%. Left ventricular ejection  fraction, by estimation, is 55 to 60%. The left ventricle has normal  function. The left ventricle has no regional wall motion abnormalities.  The left ventricular internal cavity size was   mildly dilated. Left ventricular diastolic parameters were normal.   3. Right ventricular systolic function is normal. The right ventricular  size is normal. There is normal pulmonary artery systolic pressure.   4. MR jet is eccentric, directed posterior into LA. Mild to moderate  mitral valve regurgitation.   5. Mild aortic valve sclerosis is present, with no evidence of aortic  valve stenosis.   6. The inferior vena cava is normal in size with greater than 50%  respiratory variability, suggesting right atrial pressure of 3 mmHg.    12/24/2016: Lexiscan myoview stress test 12/24/2016: 1. This was a 2-day protocol.  The resting  electrocardiogram demonstrated normal sinus rhythm, normal resting conduction and no resting arrhythmias. Low voltage.   Stress EKG is non-diagnostic for ischemia as it a pharmacologic stress using Lexiscan. Stress symptoms included dyspnea, nausea and dizziness.  2. SPECT images demonstrate small perfusion abnormality of mild intensity in the apical myocardial wall(s) on the stress images.  These defects are related to apical thinning.  Gated SPECT images reveal normal myocardial thickening and wall motion.  The left ventricular ejection fraction was calculated or visually estimated to be 53%.  This is a low risk study.   EKG:  EKG is *** ordered today.  The ekg ordered today demonstrates ***  Recent Labs: 05/10/2021: BUN 25; Creatinine, Ser 1.33; Hemoglobin 13.0; Platelets 428; Potassium 2.9; Sodium 139  Recent Lipid Panel No results found for: "CHOL", "TRIG", "HDL", "CHOLHDL", "VLDL", "LDLCALC", "LDLDIRECT"  Risk Assessment/Calculations:  {Does this patient have ATRIAL FIBRILLATION?:[phone]}  Home Medications   No outpatient medications have been marked as taking for the 12/07/21 encounter (Appointment) with Elgie Collard, PA-C.     Review of Systems   ***  All other systems reviewed and are otherwise negative except as noted above.  Physical Exam    VS:  LMP 10/11/2021 (Approximate)  , BMI There is no height or weight on file to calculate BMI.  Wt Readings from Last 3 Encounters:  11/17/21 269 lb (122 kg)  06/10/21 268 lb 6.4 oz (121.7 kg)  05/10/21 262 lb (118.8 kg)     GEN: Well nourished, well developed, in no acute distress. HEENT: normal. Neck: Supple, no JVD, carotid bruits, or masses. Cardiac: ***RRR, no murmurs, rubs, or gallops. No clubbing, cyanosis, edema.  ***Radials/PT 2+ and equal bilaterally.  Respiratory:  ***Respirations regular and unlabored, clear to auscultation bilaterally. GI: Soft, nontender, nondistended. MS: No deformity or atrophy. Skin: Warm  and dry, no rash. Neuro:  Strength and sensation are intact. Psych: Normal affect.  Assessment & Plan    Dyspnea on exertion -mild to moderate MR -Asthma  -needs follow-up echo Chest pain (noncardiac nature) -possible spasm Hypertension Hyperlipidemia Morbid obesity  No BP recorded.  {Refresh Note OR Click here to enter BP  :1}***      Disposition: Follow up {follow up:15908} with Freada Bergeron, MD or APP.  Signed, Elgie Collard, PA-C 12/06/2021, 2:04 PM [place]

## 2021-12-07 ENCOUNTER — Ambulatory Visit: Payer: Medicaid Other | Admitting: Physician Assistant

## 2021-12-07 NOTE — Progress Notes (Deleted)
Office Visit    Patient Name: Felicia Molina Date of Encounter: 12/07/2021  PCP:  Kathyrn Lass   [place]  Cardiologist:  Freada Bergeron, MD  Advanced Practice Provider:  No care team member to display Electrophysiologist:  None   Chief Complaint    Felicia Molina is a 51 y.o. female past medical history significant for HTN, HLD, fibromyalgia and obesity presents today for follow-up.  Patient's history includes ER visit to [facility] 01/08/2020 for episode of sharp, left-sided chest pain.  EKG without ischemic changes.  Troponin unremarkable.  Her pain subsided without any intervention, however, given her risk factors she was recommended for stress testing.  Unfortunately, she had to leave prior to testing.  She presented to the ER again 9/15 with recurrence of her left-sided and central chest pain.  EKG demonstrated sinus rhythm with low voltage in the pericardial leads, prolonged QTc but no evidence of ischemia.  She was seen in follow-up and TTE showed EF 55 to 60%, normal RV, mild to moderate MR.  Coronary CTA 10/21 with no significant disease.  Calcium score 0.  She was last seen February 2023 and had been having significant chest pain whenever she gets angry or stressed.  No significant exertional symptoms at this time.  She had a recent ER visit for chest pain but thought to be due to muscle strain.  She also continued to have significant dyspnea due to her asthma.  No significant lower extremity edema. BP well controlled.   Today, she ***  Past Medical History    Past Medical History:  Diagnosis Date   Anemia    Asthma    Atypical chest pain 07/28/2018   Back pain, chronic    Fibromyalgia    Hypertension    Mitral regurgitation    Morbid obesity (HCC)    Neurogenic bladder    Pain management contract signed    Palpitations 07/28/2018   Panic attacks    Prolonged Q-T interval on ECG    Reflux    Past Surgical History:  Procedure Laterality Date    BACK SURGERY     bladder stimulator     CARPAL TUNNEL RELEASE     TONSILLECTOMY     URETERAL STENT PLACEMENT  2008    Allergies  Allergies  Allergen Reactions   Clindamycin/Lincomycin Hives and Swelling   Ibuprofen Hives    Hives across the face   Tomato Hives   Vicodin [Hydrocodone-Acetaminophen] Hives   EKGs/Labs/Other Studies Reviewed:   The following studies were reviewed today:  Coronary CTA 01/2020: FINDINGS: Image quality: Average.   Noise artifact is: Moderate signal to noise artifact is present due to obesity (BMI 55).   Coronary Arteries:  Normal coronary origin.  Right dominance.   Left main: The left main is a large caliber vessel with a normal take off from the left coronary cusp that bifurcates to form a left anterior descending artery and a left circumflex artery. There is no plaque or stenosis.   Left anterior descending artery: The LAD is patent without evidence of plaque or stenosis. The LAD gives off 3 patent diagonal branches. There is limited evaluation of the distal segments due to obesity artifact but these appear normal.   Left circumflex artery: The LCX is non-dominant and patent with no evidence of plaque or stenosis. Limited evaluation of the distal LCX due to obesity artifact.   Right coronary artery: The RCA is dominant with normal take off from the  right coronary cusp. There is no evidence of plaque or stenosis. The RCA terminates as a PDA and right posterolateral branch without evidence of plaque or stenosis. Again, the distal segments appear normal but there is obesity artifact.   Right Atrium: Right atrial size is within normal limits.   Right Ventricle: The right ventricular cavity is within normal limits.   Left Atrium: Left atrial size is normal in size with no left atrial appendage filling defect.   Left Ventricle: The ventricular cavity size is within normal limits. There are no stigmata of prior infarction. There is no  abnormal filling defect.   Pulmonary arteries: Normal in size without proximal filling defect.   Pulmonary veins: Normal pulmonary venous drainage.   Pericardium: Normal thickness with no significant effusion or calcium present.   Cardiac valves: The aortic valve is trileaflet without significant calcification. The mitral valve is normal structure without significant calcification.   Aorta: Normal caliber with no significant disease.   Extra-cardiac findings: See attached radiology report for non-cardiac structures.   IMPRESSION: 1. Coronary calcium score of 0.   2. Normal coronary origin with right dominance.   3. There is severe signal to noise artifact related to obesity (BMI 55) but the coronary arteries appear to be normal without evidence of plaque or stenosis.   4. Limited evaluation of the distal coronary segments.   RECOMMENDATIONS: 1. No evidence of CAD (0%). Consider non-atherosclerotic causes of chest pain.  TTE 12/2019: IMPRESSIONS   1. Poor acoustic windows limit study.   2. Global longitudinal strain is -19.2%. Left ventricular ejection  fraction, by estimation, is 55 to 60%. The left ventricle has normal  function. The left ventricle has no regional wall motion abnormalities.  The left ventricular internal cavity size was   mildly dilated. Left ventricular diastolic parameters were normal.   3. Right ventricular systolic function is normal. The right ventricular  size is normal. There is normal pulmonary artery systolic pressure.   4. MR jet is eccentric, directed posterior into LA. Mild to moderate  mitral valve regurgitation.   5. Mild aortic valve sclerosis is present, with no evidence of aortic  valve stenosis.   6. The inferior vena cava is normal in size with greater than 50%  respiratory variability, suggesting right atrial pressure of 3 mmHg.    12/24/2016: Lexiscan myoview stress test 12/24/2016: 1. This was a 2-day protocol.  The resting  electrocardiogram demonstrated normal sinus rhythm, normal resting conduction and no resting arrhythmias. Low voltage.   Stress EKG is non-diagnostic for ischemia as it a pharmacologic stress using Lexiscan. Stress symptoms included dyspnea, nausea and dizziness.  2. SPECT images demonstrate small perfusion abnormality of mild intensity in the apical myocardial wall(s) on the stress images.  These defects are related to apical thinning.  Gated SPECT images reveal normal myocardial thickening and wall motion.  The left ventricular ejection fraction was calculated or visually estimated to be 53%.  This is a low risk study.   EKG:  EKG is *** ordered today.  The ekg ordered today demonstrates ***  Recent Labs: 05/10/2021: BUN 25; Creatinine, Ser 1.33; Hemoglobin 13.0; Platelets 428; Potassium 2.9; Sodium 139  Recent Lipid Panel No results found for: "CHOL", "TRIG", "HDL", "CHOLHDL", "VLDL", "LDLCALC", "LDLDIRECT"  Risk Assessment/Calculations:  {Does this patient have ATRIAL FIBRILLATION?:[id number]}  Home Medications   No outpatient medications have been marked as taking for the 12/08/21 encounter (Appointment) with Elgie Collard, PA-C.     Review of Systems   ***  All other systems reviewed and are otherwise negative except as noted above.  Physical Exam    VS:  LMP 10/11/2021 (Approximate)  , BMI There is no height or weight on file to calculate BMI.  Wt Readings from Last 3 Encounters:  11/17/21 269 lb (122 kg)  06/10/21 268 lb 6.4 oz (121.7 kg)  05/10/21 262 lb (118.8 kg)     GEN: Well nourished, well developed, in no acute distress. HEENT: normal. Neck: Supple, no JVD, carotid bruits, or masses. Cardiac: ***RRR, no murmurs, rubs, or gallops. No clubbing, cyanosis, edema.  ***Radials/PT 2+ and equal bilaterally.  Respiratory:  ***Respirations regular and unlabored, clear to auscultation bilaterally. GI: Soft, nontender, nondistended. MS: No deformity or atrophy. Skin: Warm  and dry, no rash. Neuro:  Strength and sensation are intact. Psych: Normal affect.  Assessment & Plan    Dyspnea on exertion -mild to moderate MR -Asthma  -needs follow-up echo Chest pain (noncardiac nature) -possible spasm Hypertension Hyperlipidemia Morbid obesity  No BP recorded.  {Refresh Note OR Click here to enter BP  :1}***      Disposition: Follow up {follow up:15908} with Freada Bergeron, MD or APP.  Signed, Elgie Collard, PA-C 12/07/2021, 9:10 PM [facility]

## 2021-12-08 ENCOUNTER — Ambulatory Visit: Payer: Medicaid Other | Admitting: Physician Assistant

## 2021-12-09 ENCOUNTER — Ambulatory Visit: Payer: Medicaid Other | Admitting: Physician Assistant

## 2021-12-13 NOTE — Progress Notes (Unsigned)
Office Visit    Patient Name: Felicia Molina Date of Encounter: 12/14/2021  PCP:  Kathyrn Lass   [place]  Cardiologist:  Freada Bergeron, MD  Advanced Practice Provider:  No care team member to display Electrophysiologist:  None   HPI    Felicia Molina is a 51 y.o. female past medical history significant for HTN, HLD, fibromyalgia and obesity presents today for follow-up.  Patient's history includes ER visit to [facility] 01/08/2020 for episode of sharp, left-sided chest pain.  EKG without ischemic changes.  Troponin unremarkable.  Her pain subsided without any intervention, however, given her risk factors she was recommended for stress testing.  Unfortunately, she had to leave prior to testing.  She presented to the ER again 9/15 with recurrence of her left-sided and central chest pain.  EKG demonstrated sinus rhythm with low voltage in the pericardial leads, prolonged QTc but no evidence of ischemia.  She was seen in follow-up and TTE showed EF 55 to 60%, normal RV, mild to moderate MR.  Coronary CTA 10/21 with no significant disease.  Calcium score 0.  She was last seen February 2023 and had been having significant chest pain whenever she gets angry or stressed.  No significant exertional symptoms at this time.  She had a recent ER visit for chest pain but thought to be due to muscle strain.  She also continued to have significant dyspnea due to her asthma.  No significant lower extremity edema. BP well controlled.   Today, she states that she is having some chest pains that occur while she is walking her dog.  She also tells me that she has a mass on her uterus that she needs to get removed.  She will need cardiac clearance for this.  I have suggested speaking with her surgeon and having them send [place] an official clearance request.  She states her blood pressure has been all over the place but is well controlled today.  She states it has been as high as 190/97.  Of note,  she has been using a wrist cuff and we have told her that this is an accurate.  We have provided her with a upper arm cuff today.  She also states she is having increased shortness of breath.  She also occasionally has lower extremity edema and states she gets short of breath when laying flat.  Reports no palpitations.    Past Medical History    Past Medical History:  Diagnosis Date   Anemia    Asthma    Atypical chest pain 07/28/2018   Back pain, chronic    Fibromyalgia    Hypertension    Mitral regurgitation    Morbid obesity (HCC)    Neurogenic bladder    Pain management contract signed    Palpitations 07/28/2018   Panic attacks    Prolonged Q-T interval on ECG    Reflux    Past Surgical History:  Procedure Laterality Date   BACK SURGERY     bladder stimulator     CARPAL TUNNEL RELEASE     TONSILLECTOMY     URETERAL STENT PLACEMENT  2008    Allergies  Allergies  Allergen Reactions   Clindamycin/Lincomycin Hives and Swelling   Ibuprofen Hives    Hives across the face   Tomato Hives   Vicodin [Hydrocodone-Acetaminophen] Hives   EKGs/Labs/Other Studies Reviewed:   The following studies were reviewed today:  Coronary CTA 01/2020: FINDINGS: Image quality: Average.  Noise artifact is: Moderate signal to noise artifact is present due to obesity (BMI 55).   Coronary Arteries:  Normal coronary origin.  Right dominance.   Left main: The left main is a large caliber vessel with a normal take off from the left coronary cusp that bifurcates to form a left anterior descending artery and a left circumflex artery. There is no plaque or stenosis.   Left anterior descending artery: The LAD is patent without evidence of plaque or stenosis. The LAD gives off 3 patent diagonal branches. There is limited evaluation of the distal segments due to obesity artifact but these appear normal.   Left circumflex artery: The LCX is non-dominant and patent with no evidence of plaque  or stenosis. Limited evaluation of the distal LCX due to obesity artifact.   Right coronary artery: The RCA is dominant with normal take off from the right coronary cusp. There is no evidence of plaque or stenosis. The RCA terminates as a PDA and right posterolateral branch without evidence of plaque or stenosis. Again, the distal segments appear normal but there is obesity artifact.   Right Atrium: Right atrial size is within normal limits.   Right Ventricle: The right ventricular cavity is within normal limits.   Left Atrium: Left atrial size is normal in size with no left atrial appendage filling defect.   Left Ventricle: The ventricular cavity size is within normal limits. There are no stigmata of prior infarction. There is no abnormal filling defect.   Pulmonary arteries: Normal in size without proximal filling defect.   Pulmonary veins: Normal pulmonary venous drainage.   Pericardium: Normal thickness with no significant effusion or calcium present.   Cardiac valves: The aortic valve is trileaflet without significant calcification. The mitral valve is normal structure without significant calcification.   Aorta: Normal caliber with no significant disease.   Extra-cardiac findings: See attached radiology report for non-cardiac structures.   IMPRESSION: 1. Coronary calcium score of 0.   2. Normal coronary origin with right dominance.   3. There is severe signal to noise artifact related to obesity (BMI 55) but the coronary arteries appear to be normal without evidence of plaque or stenosis.   4. Limited evaluation of the distal coronary segments.   RECOMMENDATIONS: 1. No evidence of CAD (0%). Consider non-atherosclerotic causes of chest pain.  TTE 12/2019: IMPRESSIONS   1. Poor acoustic windows limit study.   2. Global longitudinal strain is -19.2%. Left ventricular ejection  fraction, by estimation, is 55 to 60%. The left ventricle has normal  function.  The left ventricle has no regional wall motion abnormalities.  The left ventricular internal cavity size was   mildly dilated. Left ventricular diastolic parameters were normal.   3. Right ventricular systolic function is normal. The right ventricular  size is normal. There is normal pulmonary artery systolic pressure.   4. MR jet is eccentric, directed posterior into LA. Mild to moderate  mitral valve regurgitation.   5. Mild aortic valve sclerosis is present, with no evidence of aortic  valve stenosis.   6. The inferior vena cava is normal in size with greater than 50%  respiratory variability, suggesting right atrial pressure of 3 mmHg.    12/24/2016: Lexiscan myoview stress test 12/24/2016: 1. This was a 2-day protocol.  The resting electrocardiogram demonstrated normal sinus rhythm, normal resting conduction and no resting arrhythmias. Low voltage.   Stress EKG is non-diagnostic for ischemia as it a pharmacologic stress using Lexiscan. Stress symptoms included dyspnea,  nausea and dizziness.  2. SPECT images demonstrate small perfusion abnormality of mild intensity in the apical myocardial wall(s) on the stress images.  These defects are related to apical thinning.  Gated SPECT images reveal normal myocardial thickening and wall motion.  The left ventricular ejection fraction was calculated or visually estimated to be 53%.  This is a low risk study.   EKG:  EKG is not ordered today.  Recent Labs: 05/10/2021: BUN 25; Creatinine, Ser 1.33; Hemoglobin 13.0; Platelets 428; Potassium 2.9; Sodium 139  Recent Lipid Panel No results found for: "CHOL", "TRIG", "HDL", "CHOLHDL", "VLDL", "LDLCALC", "LDLDIRECT" Home Medications   Current Meds  Medication Sig   albuterol (VENTOLIN HFA) 108 (90 Base) MCG/ACT inhaler Inhale 2 puffs into the lungs every 4 (four) hours as needed for wheezing or shortness of breath.   atenolol-chlorthalidone (TENORETIC) 100-25 MG tablet Take 1 tablet by mouth daily.     budesonide-formoterol (SYMBICORT) 160-4.5 MCG/ACT inhaler Inhale 2 puffs into the lungs 2 (two) times daily.   buPROPion (WELLBUTRIN SR) 150 MG 12 hr tablet Take 150 mg by mouth 2 (two) times daily.    cetirizine (ZYRTEC) 10 MG tablet Take 10 mg by mouth daily.    cyclobenzaprine (FLEXERIL) 10 MG tablet Take 1 tablet (10 mg total) by mouth 3 (three) times daily as needed for muscle spasms.   diclofenac sodium (VOLTAREN) 1 % GEL Apply 2 g topically 4 (four) times daily.   fluconazole (DIFLUCAN) 150 MG tablet Take 1 tablet (150 mg total) by mouth daily. Take one dose today and repeat in 3 days.   fluticasone (FLONASE) 50 MCG/ACT nasal spray 2 SPRAYS INTO EACH NOSTRIL DAILY   hydrALAZINE (APRESOLINE) 100 MG tablet Take 100 mg by mouth 2 (two) times daily.   ipratropium (ATROVENT) 0.06 % nasal spray Place 2 sprays into the nose 4 (four) times daily.   isosorbide mononitrate (IMDUR) 30 MG 24 hr tablet Take 1 tablet (30 mg total) by mouth daily.   losartan (COZAAR) 100 MG tablet TK 1 T PO DAILY   montelukast (SINGULAIR) 10 MG tablet Take 10 mg by mouth at bedtime.   omeprazole (PRILOSEC) 40 MG capsule Take 40 mg by mouth daily.   ondansetron (ZOFRAN) 4 MG tablet Take 1 tablet (4 mg total) by mouth every 8 (eight) hours as needed for nausea or vomiting.   oxyCODONE (ROXICODONE) 15 MG immediate release tablet Take 15 mg by mouth in the morning, at noon, in the evening, and at bedtime.   potassium chloride SA (KLOR-CON M) 20 MEQ tablet Take 2 tablets (40 mEq total) by mouth daily.   predniSONE (STERAPRED UNI-PAK 21 TAB) 10 MG (21) TBPK tablet Take 6 tabs for 2 days, then 5 for 2 days, then 4 for 2 days, then 3 for 2 days, 2 for 2 days, then 1 for 2 days   pregabalin (LYRICA) 75 MG capsule Take 75 mg by mouth 2 (two) times daily.    rosuvastatin (CRESTOR) 20 MG tablet Take 20 mg by mouth daily.    [DISCONTINUED] cyclobenzaprine (FLEXERIL) 10 MG tablet Take 10 mg by mouth 3 (three) times daily as needed.  Takes for muscle spasms   [DISCONTINUED] furosemide (LASIX) 40 MG tablet Take 1 tablet (40 mg total) by mouth daily.   [DISCONTINUED] isosorbide mononitrate (IMDUR) 30 MG 24 hr tablet Take 0.5 tablets (15 mg total) by mouth daily.   [DISCONTINUED] metoprolol tartrate (LOPRESSOR) 100 MG tablet Take '100mg'$  tablet 2 hours prior to your cardiac CT  scan.     Review of Systems      All other systems reviewed and are otherwise negative except as noted above.  Physical Exam    VS:  BP 124/76   Pulse 75   Ht 5' (1.524 m)   Wt 261 lb (118.4 kg)   LMP 10/11/2021 (Approximate)   SpO2 99%   BMI 50.97 kg/m  , BMI Body mass index is 50.97 kg/m.  Wt Readings from Last 3 Encounters:  12/14/21 261 lb (118.4 kg)  11/17/21 269 lb (122 kg)  06/10/21 268 lb 6.4 oz (121.7 kg)     GEN: Well nourished, well developed, in no acute distress. HEENT: normal. Neck: Supple, no JVD, carotid bruits, or masses. Cardiac: RRR, no murmurs, rubs, or gallops. No clubbing, cyanosis, 1+ non pitting edema.  Radials/PT 2+ and equal bilaterally.  Respiratory:  Respirations regular and unlabored, clear to auscultation bilaterally. GI: Soft, nontender, nondistended. MS: No deformity or atrophy. Skin: Warm and dry, no rash. Neuro:  Strength and sensation are intact. Psych: Normal affect.  Assessment & Plan    Dyspnea on exertion/LE edema -mild to moderate MR -Asthma  -needs follow-up echo -She can take an extra half of Lasix as needed for lower extremity edema.  She currently is on 40 mg daily.  Chest pain  -Thought to be noncardiac in the past and possible coronary spasm -She has not had a coronary CT scan since 2021, will order updated coronary CT -Increase Imdur from 15 mg to 30 mg daily, continue Lasix 40 mg daily, Cozaar 100 mg daily, Lopressor 100 mg daily, atenolol/chlorthalidone 100-25 mg daily, and Crestor 20 mg daily  Hypertension -Reportedly as high as 235T systolic, however this was taken with a  wrist cuff -Normal in the office today -We have provided her with an upper arm cuff today and asked her to monitor blood pressure once a day an hour after morning medications  Hyperlipidemia -She will need an updated lipid panel when she is next in the office -Continue Crestor 20 mg daily  Morbid obesity -261 lbs and she continues to work on loosing -low sodium diet and encouraged daily walking         Disposition: Follow up  6 months with Freada Bergeron, MD or APP.  Signed, Elgie Collard, PA-C 12/14/2021, 5:03 PM  [facility]

## 2021-12-14 ENCOUNTER — Encounter: Payer: Self-pay | Admitting: Physician Assistant

## 2021-12-14 ENCOUNTER — Ambulatory Visit (INDEPENDENT_AMBULATORY_CARE_PROVIDER_SITE_OTHER): Payer: Medicaid Other | Admitting: Physician Assistant

## 2021-12-14 VITALS — BP 124/76 | HR 75 | Ht 60.0 in | Wt 261.0 lb

## 2021-12-14 DIAGNOSIS — R079 Chest pain, unspecified: Secondary | ICD-10-CM

## 2021-12-14 DIAGNOSIS — R0600 Dyspnea, unspecified: Secondary | ICD-10-CM

## 2021-12-14 DIAGNOSIS — R0609 Other forms of dyspnea: Secondary | ICD-10-CM

## 2021-12-14 DIAGNOSIS — Z6841 Body Mass Index (BMI) 40.0 and over, adult: Secondary | ICD-10-CM

## 2021-12-14 DIAGNOSIS — I10 Essential (primary) hypertension: Secondary | ICD-10-CM

## 2021-12-14 MED ORDER — METOPROLOL TARTRATE 100 MG PO TABS: ORAL_TABLET | ORAL | 0 refills | Status: DC

## 2021-12-14 MED ORDER — ISOSORBIDE MONONITRATE ER 30 MG PO TB24: 30.0000 mg | ORAL_TABLET | Freq: Every day | ORAL | 11 refills | Status: DC

## 2021-12-14 MED ORDER — FUROSEMIDE 40 MG PO TABS: 40.0000 mg | ORAL_TABLET | Freq: Every day | ORAL | 11 refills | Status: DC

## 2021-12-14 NOTE — Patient Instructions (Addendum)
Medication Instructions:  1.You may take an extra 1/2 (20 mg) of a lasix as needed for swelling 2.Increase isosorbide mononitrate to 30 mg daily 3.Take metoprolol tartrate 100 mg 2 hours prior to your cardiac CT *If you need a refill on your cardiac medications before your next appointment, please call your pharmacy*   Lab Work: BMET today If you have labs (blood work) drawn today and your tests are completely normal, you will receive your results only by: [place] (if you have MyChart) OR A paper copy in the mail If you have any lab test that is abnormal or we need to change your treatment, we will call you to review the results.   Testing/Procedures: Your physician has requested that you have an echocardiogram. Echocardiography is a painless test that uses sound waves to create images of your heart. It provides your doctor with information about the size and shape of your heart and how well your heart's chambers and valves are working. This procedure takes approximately one hour. There are no restrictions for this procedure.     Your cardiac CT will be scheduled at one of the below locations:   [facility] [address] [place], [place] [address] [phone]  [place] [address] [place],  [address] [phone]  If scheduled at [facility], please arrive at the [facility] (Entrance C2) of [facility] 30 minutes prior to test start time. You can use the FREE valet parking offered at entrance C (encouraged to control the heart rate for the test)  Proceed to the [facility] Radiology Department (first floor) to check-in and test prep.  All radiology patients and guests should use entrance C2 at [facility], accessed from [facility], even though the hospital's physical address listed is [address]    If scheduled at  [facility], please arrive 15 mins early for check-in and test prep.  Please follow these instructions carefully (unless otherwise directed):  On the Night Before the Test: Be sure to Drink plenty of water. Do not consume any caffeinated/decaffeinated beverages or chocolate 12 hours prior to your test. Do not take any antihistamines 12 hours prior to your test.  On the Day of the Test: Drink plenty of water until 1 hour prior to the test. Do not eat any food 4 hours prior to the test. You may take your regular medications prior to the test.  Take metoprolol (Lopressor) 100 mg two hours prior to test. HOLD Furosemide/Hydrochlorothiazide morning of the test. FEMALES- please wear underwire-free bra if available, avoid dresses & tight clothing       After the Test: Drink plenty of water. After receiving IV contrast, you may experience a mild flushed feeling. This is normal. On occasion, you may experience a mild rash up to 24 hours after the test. This is not dangerous. If this occurs, you can take Benadryl 25 mg and increase your fluid intake. If you experience trouble breathing, this can be serious. If it is severe call 911 IMMEDIATELY. If it is mild, please call our office. If you take any of these medications: Glipizide/Metformin, Avandament, Glucavance, please do not take 48 hours after completing test unless otherwise instructed.  We will call to schedule your test 2-4 weeks out understanding that some insurance companies will need an authorization prior to the service being performed.   For non-scheduling related questions, please  contact the cardiac imaging nurse navigator should you have any questions/concerns: Marchia Bond, Cardiac Imaging Nurse Navigator Gordy Clement, Cardiac Imaging Nurse Navigator [facility] Direct Office Dial: [phone]   For scheduling needs, including cancellations and rescheduling, please call  [place], [phone].    Follow-Up: At [facility], you and your health needs are our priority.  As part of our continuing mission to provide you with exceptional heart care, we have created designated Provider Care Teams.  These Care Teams include your primary Cardiologist (physician) and Advanced Practice Providers (APPs -  Physician Assistants and Nurse Practitioners) who all work together to provide you with the care you need, when you need it.  We recommend signing up for the patient portal called "MyChart".  Sign up information is provided on this After Visit Summary.  MyChart is used to connect with patients for Virtual Visits (Telemedicine).  Patients are able to view lab/test results, encounter notes, upcoming appointments, etc.  Non-urgent messages can be sent to your provider as well.   To learn more about what you can do with MyChart, go to [email or web address].    Your next appointment:   After testing  The format for your next appointment:   In Person  Provider:   Freada Bergeron, MD   Call your surgeon and request that they send [place] an official pre-operative clearance form.  Your physician has requested that you regularly monitor and record your blood pressure readings at home. Please use the same machine at the same time of day to check your readings and record them to bring to your follow-up visit.   Please monitor blood pressures and keep a log of your readings.    Make sure to check 2 hours after your medications.    AVOID these things for 30 minutes before checking your blood pressure: No Drinking caffeine. No Drinking alcohol. No Eating. No Smoking. No Exercising.   Five minutes before checking your blood pressure: Pee. Sit in a dining chair. Avoid sitting in a soft couch or armchair. Be quiet. Do not talk   Important Information About Sugar      Low-Sodium Eating Plan Sodium, which is an element that makes up salt, helps you maintain a  healthy balance of fluids in your body. Too much sodium can increase your blood pressure and cause fluid and waste to be held in your body. Your health care provider or dietitian may recommend following this plan if you have high blood pressure (hypertension), kidney disease, liver disease, or heart failure. Eating less sodium can help lower your blood pressure, reduce swelling, and protect your heart, liver, and kidneys. What are tips for following this plan? Reading food labels The Nutrition Facts label lists the amount of sodium in one serving of the food. If you eat more than one serving, you must multiply the listed amount of sodium by the number of servings. Choose foods with less than 140 mg of sodium per serving. Avoid foods with 300 mg of sodium or more per serving. Shopping  Look for lower-sodium products, often labeled as "low-sodium" or "no salt added." Always check the sodium content, even if foods are labeled as "unsalted" or "no salt added." Buy fresh foods. Avoid canned foods and pre-made or frozen meals. Avoid canned, cured, or processed meats. Buy breads that have less than 80 mg of sodium per slice. Cooking  Eat more home-cooked food and less restaurant, buffet, and fast food. Avoid adding salt when cooking. Use  salt-free seasonings or herbs instead of table salt or sea salt. Check with your health care provider or pharmacist before using salt substitutes. Cook with plant-based oils, such as canola, sunflower, or olive oil. Meal planning When eating at a restaurant, ask that your food be prepared with less salt or no salt, if possible. Avoid dishes labeled as brined, pickled, cured, smoked, or made with soy sauce, miso, or teriyaki sauce. Avoid foods that contain MSG (monosodium glutamate). MSG is sometimes added to [place] food, bouillon, and some canned foods. Make meals that can be grilled, baked, poached, roasted, or steamed. These are generally made with less  sodium. General information Most people on this plan should limit their sodium intake to 1,500-2,000 mg (milligrams) of sodium each day. What foods should I eat? Fruits Fresh, frozen, or canned fruit. Fruit juice. Vegetables Fresh or frozen vegetables. "No salt added" canned vegetables. "No salt added" tomato sauce and paste. Low-sodium or reduced-sodium tomato and vegetable juice. Grains Low-sodium cereals, including oats, puffed wheat and rice, and shredded wheat. Low-sodium crackers. Unsalted rice. Unsalted pasta. Low-sodium bread. Whole-grain breads and whole-grain pasta. Meats and other proteins Fresh or frozen (no salt added) meat, poultry, seafood, and fish. Low-sodium canned tuna and salmon. Unsalted nuts. Dried peas, beans, and lentils without added salt. Unsalted canned beans. Eggs. Unsalted nut butters. Dairy Milk. Soy milk. Cheese that is naturally low in sodium, such as ricotta cheese, fresh mozzarella, or Swiss cheese. Low-sodium or reduced-sodium cheese. Cream cheese. Yogurt. Seasonings and condiments Fresh and dried herbs and spices. Salt-free seasonings. Low-sodium mustard and ketchup. Sodium-free salad dressing. Sodium-free light mayonnaise. Fresh or refrigerated horseradish. Lemon juice. Vinegar. Other foods Homemade, reduced-sodium, or low-sodium soups. Unsalted popcorn and pretzels. Low-salt or salt-free chips. The items listed above may not be a complete list of foods and beverages you can eat. Contact a dietitian for more information. What foods should I avoid? Vegetables Sauerkraut, pickled vegetables, and relishes. Olives. [place] fries. Onion rings. Regular canned vegetables (not low-sodium or reduced-sodium). Regular canned tomato sauce and paste (not low-sodium or reduced-sodium). Regular tomato and vegetable juice (not low-sodium or reduced-sodium). Frozen vegetables in sauces. Grains Instant hot cereals. Bread stuffing, pancake, and biscuit mixes. Croutons.  Seasoned rice or pasta mixes. Noodle soup cups. Boxed or frozen macaroni and cheese. Regular salted crackers. Self-rising flour. Meats and other proteins Meat or fish that is salted, canned, smoked, spiced, or pickled. Precooked or cured meat, such as sausages or meat loaves. Berniece Salines. Ham. Pepperoni. Hot dogs. Corned beef. Chipped beef. Salt pork. Jerky. Pickled herring. Anchovies and sardines. Regular canned tuna. Salted nuts. Dairy Processed cheese and cheese spreads. Hard cheeses. Cheese curds. Blue cheese. Feta cheese. String cheese. Regular cottage cheese. Buttermilk. Canned milk. Fats and oils Salted butter. Regular margarine. Ghee. Bacon fat. Seasonings and condiments Onion salt, garlic salt, seasoned salt, table salt, and sea salt. Canned and packaged gravies. Worcestershire sauce. Tartar sauce. Barbecue sauce. Teriyaki sauce. Soy sauce, including reduced-sodium. Steak sauce. Fish sauce. Oyster sauce. Cocktail sauce. Horseradish that you find on the shelf. Regular ketchup and mustard. Meat flavorings and tenderizers. Bouillon cubes. Hot sauce. Pre-made or packaged marinades. Pre-made or packaged taco seasonings. Relishes. Regular salad dressings. Salsa. Other foods Salted popcorn and pretzels. Corn chips and puffs. Potato and tortilla chips. Canned or dried soups. Pizza. Frozen entrees and pot pies. The items listed above may not be a complete list of foods and beverages you should avoid. Contact a dietitian for more information. Summary Eating less sodium  can help lower your blood pressure, reduce swelling, and protect your heart, liver, and kidneys. Most people on this plan should limit their sodium intake to 1,500-2,000 mg (milligrams) of sodium each day. Canned, boxed, and frozen foods are high in sodium. Restaurant foods, fast foods, and pizza are also very high in sodium. You also get sodium by adding salt to food. Try to cook at home, eat more fresh fruits and vegetables, and eat less  fast food and canned, processed, or prepared foods. This information is not intended to replace advice given to you by your health care provider. Make sure you discuss any questions you have with your health care provider. Document Revised: 05/18/2019 Document Reviewed: 03/14/2019 Elsevier Patient Education  [place].

## 2021-12-15 ENCOUNTER — Other Ambulatory Visit: Payer: Medicaid Other

## 2021-12-16 ENCOUNTER — Telehealth: Payer: Self-pay | Admitting: *Deleted

## 2021-12-16 LAB — BASIC METABOLIC PANEL
BUN/Creatinine Ratio: 13 (ref 9–23)
BUN: 15 mg/dL (ref 6–24)
CO2: 24 mmol/L (ref 20–29)
Calcium: 9.6 mg/dL (ref 8.7–10.2)
Chloride: 101 mmol/L (ref 96–106)
Creatinine, Ser: 1.18 mg/dL — ABNORMAL HIGH (ref 0.57–1.00)
Glucose: 101 mg/dL — ABNORMAL HIGH (ref 70–99)
Potassium: 3.9 mmol/L (ref 3.5–5.2)
Sodium: 141 mmol/L (ref 134–144)
eGFR: 56 mL/min/{1.73_m2} — ABNORMAL LOW (ref >59)

## 2021-12-16 NOTE — Telephone Encounter (Signed)
Patient given contact and office information for scheduled appointment with UROGYN on 05/03/2022. Patient advised to set up MyChart to help her keep up with all scheduled appointments with [facility].

## 2021-12-22 ENCOUNTER — Telehealth (HOSPITAL_COMMUNITY): Payer: Self-pay | Admitting: Emergency Medicine

## 2021-12-22 NOTE — Telephone Encounter (Signed)
Attempted to call patient regarding upcoming cardiac CT appointment. °Left message on voicemail with name and callback number °Patria Warzecha RN Navigator Cardiac Imaging °[facility] °[phone] Office °[phone] Cell ° °

## 2021-12-23 ENCOUNTER — Ambulatory Visit (HOSPITAL_COMMUNITY)
Admission: RE | Admit: 2021-12-23 | Discharge: 2021-12-23 | Disposition: A | Discharge status: Home / Self Care | Payer: Medicaid Other | Source: Ambulatory Visit | Attending: Physician Assistant | Admitting: Physician Assistant

## 2021-12-23 DIAGNOSIS — R079 Chest pain, unspecified: Secondary | ICD-10-CM

## 2021-12-23 MED ORDER — NITROGLYCERIN 0.4 MG SL SUBL
SUBLINGUAL_TABLET | SUBLINGUAL | Status: AC
  Filled 2021-12-23: qty 2

## 2021-12-23 MED ORDER — IOHEXOL 350 MG/ML SOLN
100.0000 mL | Freq: Once | INTRAVENOUS | Status: AC | PRN
  Administered 2021-12-23: 100 mL via INTRAVENOUS

## 2021-12-23 MED ORDER — NITROGLYCERIN 0.4 MG SL SUBL
0.8000 mg | SUBLINGUAL_TABLET | Freq: Once | SUBLINGUAL | Status: AC
  Administered 2021-12-23: 0.8 mg via SUBLINGUAL

## 2021-12-25 ENCOUNTER — Other Ambulatory Visit: Payer: Self-pay | Admitting: *Deleted

## 2021-12-25 MED ORDER — POTASSIUM CHLORIDE CRYS ER 20 MEQ PO TBCR: 40.0000 meq | EXTENDED_RELEASE_TABLET | Freq: Every day | ORAL | 3 refills | Status: DC

## 2021-12-29 ENCOUNTER — Ambulatory Visit: Payer: Medicaid Other

## 2021-12-30 ENCOUNTER — Other Ambulatory Visit: Payer: Self-pay | Admitting: Physician Assistant

## 2022-01-06 ENCOUNTER — Ambulatory Visit (HOSPITAL_COMMUNITY): Payer: Medicaid Other

## 2022-01-19 ENCOUNTER — Ambulatory Visit (HOSPITAL_COMMUNITY): Payer: Medicaid Other

## 2022-01-20 ENCOUNTER — Encounter: Payer: Self-pay | Admitting: Obstetrics and Gynecology

## 2022-01-20 ENCOUNTER — Ambulatory Visit (INDEPENDENT_AMBULATORY_CARE_PROVIDER_SITE_OTHER): Payer: Medicaid Other | Admitting: Obstetrics and Gynecology

## 2022-01-20 VITALS — BP 143/79 | HR 58 | Ht 59.0 in | Wt 249.0 lb

## 2022-01-20 DIAGNOSIS — N816 Rectocele: Secondary | ICD-10-CM

## 2022-01-20 DIAGNOSIS — N3281 Overactive bladder: Secondary | ICD-10-CM

## 2022-01-20 DIAGNOSIS — R350 Frequency of micturition: Secondary | ICD-10-CM | POA: Diagnosis not present

## 2022-01-20 DIAGNOSIS — Z6841 Body Mass Index (BMI) 40.0 and over, adult: Secondary | ICD-10-CM

## 2022-01-20 DIAGNOSIS — N393 Stress incontinence (female) (male): Secondary | ICD-10-CM

## 2022-01-20 DIAGNOSIS — M62838 Other muscle spasm: Secondary | ICD-10-CM

## 2022-01-20 DIAGNOSIS — N8110 Cystocele, unspecified: Secondary | ICD-10-CM

## 2022-01-20 LAB — POCT URINALYSIS DIPSTICK
Bilirubin, UA: NEGATIVE
Blood, UA: NEGATIVE
Glucose, UA: NEGATIVE
Ketones, UA: NEGATIVE
Leukocytes, UA: NEGATIVE
Nitrite, UA: NEGATIVE
Protein, UA: POSITIVE — AB
Spec Grav, UA: >=1.03 — AB (ref 1.010–1.025)
Urobilinogen, UA: 0.2 E.U./dL
pH, UA: 6 (ref 5.0–8.0)

## 2022-01-20 NOTE — Progress Notes (Signed)
[facility] New Patient Evaluation and Consultation  Referring Provider: Rasch, Artist Pais, NP PCP: Pcp, No Date of Service: 01/20/2022  SUBJECTIVE Chief Complaint: New Patient (Initial Visit) (Clotiel Molina is a 51 y.o. female complains of prolapse and a tumor on the ovaries./)  History of Present Illness: Felicia Molina is a 51 y.o. Black or African-American female seen in consultation at the request of NP Noni Saupe for evaluation of prolapse.    Review of records significant for: Has a bulge in her vagina. Cystocele noted on exam. Pt reports she is still interested in childbearing.   Pelvic US on 11/20/21:  IMPRESSION: Large leiomyoma at upper LEFT uterus 6.6 cm diameter, suspect extending submucosal.   Unremarkable endometrial complex.   Nonvisualization of RIGHT ovary and limited visualization LEFT ovary.  Urinary Symptoms: Leaks urine with cough/ sneeze, laughing, lifting, going from sitting to standing, with movement to the bathroom, with urgency, while asleep, and continuously. SUI > UUI Leaks 6 time(s) per day Pad use:  12  pads per day.   She is bothered by her UI symptoms. Has a bladder stimulator, Interstim (on right)- placed by Dr Harlow Asa in [facility] in 2019.   Day time voids 12.  Nocturia: 8 times per night to void. Voiding dysfunction: she does not empty her bladder well.  does not use a catheter to empty bladder.  When urinating, she feels dribbling after finishing and the need to urinate multiple times in a row Drinks: 24 oz sweet tea, water, propel per day  UTIs: 8 UTI's in the last year- no cultures done Denies history of blood in urine and kidney or bladder stones  Pelvic Organ Prolapse Symptoms:                  She Admits to a feeling of a bulge the vaginal area.  She Admits to seeing a bulge.  This bulge is bothersome.  Bowel Symptom: Bowel movements: daily Stool consistency: hard or soft  Straining: yes.  Splinting: no.   Incomplete evacuation: yes.  She Denies accidental bowel leakage / fecal incontinence Bowel regimen: stool softener and miralax every day   Sexual Function Sexually active: yes.  Pain with sex: Yes, at the vaginal opening, deep in the pelvis, has discomfort due to prolapse  Pelvic Pain Admits to pelvic pain Location: burning in the vagina- started yesterday Pain occurs: everyday Worsened by: bending, walking, standing, lifting, turning positions.    Past Medical History:  Past Medical History:  Diagnosis Date   Anemia    Asthma    Atypical chest pain 07/28/2018   Back pain, chronic    Fibromyalgia    Hypertension    Mitral regurgitation    Morbid obesity ([place])    Neurogenic bladder    Pain management contract signed    Palpitations 07/28/2018   Panic attacks    Prolonged Q-T interval on ECG    Reflux    Rheumatoid arthritis ([place])      Past Surgical History:   Past Surgical History:  Procedure Laterality Date   BACK SURGERY     L2-4   bladder stimulator     CARPAL TUNNEL RELEASE     TONSILLECTOMY     URETERAL STENT PLACEMENT  2008     Past OB/GYN History: OB History  Gravida Para Term Preterm AB Living  '7 4 4 '$ 0 2 5  SAB IAB Ectopic Multiple Live Births  2 0 0 0 4    #  Outcome Date GA Lbr Len/2nd Weight Sex Delivery Anes PTL Lv  7 Gravida 2016     Vag-Spont     6 Term 12/25/88    F Vag-Spont   LIV  5 Term 11/28/87    M Vag-Spont   LIV  4 Term 05/17/86    M Vag-Spont   LIV  3 Term 01/24/86    M Vag-Spont   LIV  2 SAB           1 SAB             Patient's last menstrual period was 01/08/2022. Contraception: none- she does not want any more children Last pap smear was 10/2021- negative.     Medications: She has a current medication list which includes the following prescription(s): albuterol, atenolol-chlorthalidone, budesonide-formoterol, bupropion, cetirizine, cyclobenzaprine, diclofenac sodium, fluconazole, fluticasone, furosemide, hydralazine,  ipratropium, isosorbide mononitrate, losartan, metoprolol tartrate, montelukast, omeprazole, ondansetron, oxycodone, potassium chloride sa, prednisone, pregabalin, and rosuvastatin.   Allergies: Patient is allergic to clindamycin/lincomycin, ibuprofen, tomato, and vicodin [hydrocodone-acetaminophen].   Social History:  Social History   Tobacco Use   Smoking status: Former    Types: Cigarettes    Quit date: 1988    Years since quitting: 35.7    Passive exposure: Past   Smokeless tobacco: Never  Vaping Use   Vaping Use: Never used  Substance Use Topics   Alcohol use: No   Drug use: No    Relationship status: long-term partner She lives with her fiance and son.   She is not employed. Regular exercise: No History of abuse: No  Family History:   Family History  Problem Relation Age of Onset   Cancer Mother    Heart disease Mother    Heart attack Mother    Heart disease Father    Heart attack Father    Colon cancer Father    Heart disease Sister      Review of Systems: Review of Systems  Constitutional:  Positive for malaise/fatigue and weight loss. Negative for fever.  Respiratory:  Positive for cough, shortness of breath and wheezing.   Cardiovascular:  Positive for chest pain and leg swelling. Negative for palpitations.  Gastrointestinal:  Negative for abdominal pain and blood in stool.  Genitourinary:  Positive for dysuria.       + vaginal discharge and abnormal periods  Musculoskeletal:  Positive for myalgias.  Skin:  Negative for rash.  Neurological:  Positive for dizziness and headaches.  Endo/Heme/Allergies:  Bruises/bleeds easily.       + hot flashes  Psychiatric/Behavioral:  Positive for depression. The patient is nervous/anxious.      OBJECTIVE Physical Exam: Vitals:   01/20/22 1341  BP: (!) 143/79  Pulse: (!) 58  Weight: 249 lb (112.9 kg)  Height: '4\' 11"'$  (1.499 m)    Physical Exam Constitutional:      General: She is not in acute distress.     Appearance: She is obese.  Pulmonary:     Effort: Pulmonary effort is normal.  Abdominal:     General: There is no distension.     Palpations: Abdomen is soft.     Tenderness: There is no abdominal tenderness. There is no rebound.  Musculoskeletal:        General: No swelling. Normal range of motion.  Skin:    General: Skin is warm and dry.     Findings: No rash.  Neurological:     Mental Status: She is alert and oriented to person, place,  and time.  Psychiatric:        Mood and Affect: Mood normal.        Behavior: Behavior normal.      GU / Detailed Urogynecologic Evaluation:  Pelvic Exam: Normal external female genitalia; Bartholin's and Skene's glands normal in appearance; urethral meatus normal in appearance, no urethral masses or discharge.   CST: negative  Speculum exam reveals normal vaginal mucosa without atrophy. Cervix normal appearance. Uterus not palpable due to body habitus. Adnexa not palpable  Pelvic floor strength I/V  Pelvic floor musculature: Right levator tender, Right obturator tender, Left levator tender, Left obturator tender  POP-Q:   POP-Q  -1.5                                            Aa   -1.5                                           Ba  -8                                              C   6.5                                            Gh  4.5                                            Pb  11                                            tvl   0                                            Ap  0                                            Bp  -8                                              D     Rectal Exam:  Normal external rectum  Post-Void Residual (PVR) by Bladder Scan: In order to evaluate bladder emptying, we discussed obtaining a postvoid residual and she agreed to this procedure.  Procedure: The ultrasound unit was placed on the patient's abdomen in the suprapubic region after the patient had voided. A PVR of 19 ml was  obtained by bladder scan.  Laboratory  Results: POC urine: negative   ASSESSMENT AND PLAN Ms. Mallen is a 51 y.o. with:  1. Prolapse of anterior vaginal wall   2. Prolapse of posterior vaginal wall   3. Morbid obesity with BMI of 50.0-59.9, adult ([place])   4. Urinary frequency   5. Overactive bladder   6. SUI (stress urinary incontinence, female)   7. Levator spasm    Stage II anterior, Stage II posterior, Stage I apical prolapse - For treatment of pelvic organ prolapse, we discussed options for management including expectant management, conservative management, and surgical management, such as Kegels, a pessary, pelvic floor physical therapy, and specific surgical procedures. - We discussed that she is not a good surgical candidate due to her cardiovascular issues and obesity. We reviewed that losing weight would set her up for a more successful surgery in the future. She is interested in referral to medical weight loss, referral placed.  - She is not interested in a pessary at this time but will consider it.   2. OAB - currently has an interstim device in place, follow with urology in [facility].  - Discussed reducing bladder irritants, especially sweet tea, to decrease the urgency  3. SUI - For treatment of stress urinary incontinence,  non-surgical options include expectant management, weight loss, physical therapy, as well as a pessary.  - She is not interested in PT or a pessary at this time.   4. Levator spasm - high pelvic floor muscle tension could be contributing to her pelvic pain.  - The origin of pelvic floor muscle spasm can be multifactorial, including primary, reactive to a different pain source, trauma, or even part of a centralized pain syndrome.Treatment options include pelvic floor physical therapy, local (vaginal) or oral  muscle relaxants, pelvic muscle trigger point injections or centrally acting pain medications.   - She declines treatment at this time.  Return  as needed. She will work on weight loss and return for further treatment.     Jaquita Folds, MD   Medical Decision Making:  - Reviewed/ ordered a clinical laboratory test - Review and summation of prior records

## 2022-01-20 NOTE — Patient Instructions (Signed)
Recommend using a stool or squatty potty to elevate your feet with bowel movements and prevent straining.   You have a stage 2 (out of 4) prolapse.  We discussed the fact that it is not life threatening but there are several treatment options. For treatment of pelvic organ prolapse, we discussed options for management including expectant management, conservative management, and surgical management, such as Kegels, a pessary, pelvic floor physical therapy, and specific surgical procedures.  Recommend weight loss and cardiac clearance prior to surgery.

## 2022-01-27 ENCOUNTER — Encounter (INDEPENDENT_AMBULATORY_CARE_PROVIDER_SITE_OTHER): Payer: Medicaid Other | Admitting: Family Medicine

## 2022-01-28 ENCOUNTER — Inpatient Hospital Stay: Admission: RE | Admit: 2022-01-28 | Payer: Medicaid Other | Source: Ambulatory Visit

## 2022-02-05 ENCOUNTER — Encounter: Payer: Self-pay | Admitting: Cardiology

## 2022-02-05 ENCOUNTER — Other Ambulatory Visit (HOSPITAL_COMMUNITY): Payer: Medicaid Other

## 2022-02-05 ENCOUNTER — Encounter (HOSPITAL_COMMUNITY): Payer: Self-pay | Admitting: Physician Assistant

## 2022-02-05 NOTE — Progress Notes (Unsigned)
Patient ID: Felicia Molina, female   DOB: [birth year], 51 y.o.   MRN: [id number]  Verified appointment "no show" status with [place] at 8:32am.

## 2022-02-19 NOTE — Progress Notes (Unsigned)
Cardiology Office Note:    Date:  02/19/2022   ID:  Felicia Molina, DOB [birth year], MRN [id number]  PCP:  Pcp, No  [facility] Cardiologist:  Freada Bergeron, MD  [place] Electrophysiologist:  None   Referring MD: No ref. provider found   History of Present Illness:    Felicia Molina is a 51 y.o. female with a hx of HTN, HLD, fibromyalgia and obesity who presents to clinic for follow-up.  The patient was seen in [facility] ER on 01/08/20 for episode of sharp, left-sided chest pain.  ECG without ischemic changes. Trop-I <0.01, CK-MB 2, D-dimer 285. Her pain subsided without intervention, however, given her risk factors, she was recommended for stress testing.Unfortantely, she had to leave prior to testing as she had her daughter with her and did not have childcare available. She re-presented to [facility] ER on 09/15 with recurrence of her left sided and central chest pain. ECG here demonstrated sinus rhythm with low voltage in the precordial leads, prolonged Qtc but no evidence of ischemia.    She was seen in follow-up 09/21 where TTE with EF 55-60%, normal RV, mild-to-moderate MR. Coronary CTA 01/2020 with no significant disease. Ca score 0.  Was last seen on 12/14/21 by Nicholes Rough where she continued to have atypical chest pain. Blood pressure was fluctuating but she was using a wrist cuff. Repeat TTE was recommended.  Today, ***  Past Medical History:  Diagnosis Date   Anemia    Asthma    Atypical chest pain 07/28/2018   Back pain, chronic    Fibromyalgia    Hypertension    Mitral regurgitation    Morbid obesity (HCC)    Neurogenic bladder    Pain management contract signed    Palpitations 07/28/2018   Panic attacks    Prolonged Q-T interval on ECG    Reflux    Rheumatoid arthritis ([place])     Past Surgical History:  Procedure Laterality Date   BACK SURGERY     L2-4   bladder stimulator     CARPAL TUNNEL RELEASE     TONSILLECTOMY     URETERAL STENT PLACEMENT  2008     Current Medications: No outpatient medications have been marked as taking for the 02/22/22 encounter (Appointment) with Freada Bergeron, MD.     Allergies:   Clindamycin/lincomycin, Ibuprofen, Tomato, and Vicodin [hydrocodone-acetaminophen]   Social History   Socioeconomic History   Marital status: Single    Spouse name: Not on file   Number of children: 4   Years of education: Not on file   Highest education level: Not on file  Occupational History   Not on file  Tobacco Use   Smoking status: Former    Types: Cigarettes    Quit date: 38    Years since quitting: 35.8    Passive exposure: Past   Smokeless tobacco: Never  Vaping Use   Vaping Use: Never used  Substance and Sexual Activity   Alcohol use: No   Drug use: No   Sexual activity: Yes    Birth control/protection: None  Other Topics Concern   Not on file  Social History Narrative   Not on file   Social Determinants of Health   Financial Resource Strain: Not on file  Food Insecurity: Not on file  Transportation Needs: Not on file  Physical Activity: Not on file  Stress: Not on file  Social Connections: Not on file     Family History: The patient's *family  history includes Cancer in her mother; Colon cancer in her father; Heart attack in her father and mother; Heart disease in her father, mother, and sister.  ROS:   Please see the history of present illness.    Reports some migraine headaches and body aches. No fevers, chills, nausea or vomiting. No lightheadedness or dizziness. No syncope.   EKGs/Labs/Other Studies Reviewed:    The following studies were reviewed today: Coronary CTA 01/2020: FINDINGS: Image quality: Average.   Noise artifact is: Moderate signal to noise artifact is present due to obesity (BMI 55).   Coronary Arteries:  Normal coronary origin.  Right dominance.   Left main: The left main is a large caliber vessel with a normal take off from the left coronary cusp that  bifurcates to form a left anterior descending artery and a left circumflex artery. There is no plaque or stenosis.   Left anterior descending artery: The LAD is patent without evidence of plaque or stenosis. The LAD gives off 3 patent diagonal branches. There is limited evaluation of the distal segments due to obesity artifact but these appear normal.   Left circumflex artery: The LCX is non-dominant and patent with no evidence of plaque or stenosis. Limited evaluation of the distal LCX due to obesity artifact.   Right coronary artery: The RCA is dominant with normal take off from the right coronary cusp. There is no evidence of plaque or stenosis. The RCA terminates as a PDA and right posterolateral branch without evidence of plaque or stenosis. Again, the distal segments appear normal but there is obesity artifact.   Right Atrium: Right atrial size is within normal limits.   Right Ventricle: The right ventricular cavity is within normal limits.   Left Atrium: Left atrial size is normal in size with no left atrial appendage filling defect.   Left Ventricle: The ventricular cavity size is within normal limits. There are no stigmata of prior infarction. There is no abnormal filling defect.   Pulmonary arteries: Normal in size without proximal filling defect.   Pulmonary veins: Normal pulmonary venous drainage.   Pericardium: Normal thickness with no significant effusion or calcium present.   Cardiac valves: The aortic valve is trileaflet without significant calcification. The mitral valve is normal structure without significant calcification.   Aorta: Normal caliber with no significant disease.   Extra-cardiac findings: See attached radiology report for non-cardiac structures.   IMPRESSION: 1. Coronary calcium score of 0.   2. Normal coronary origin with right dominance.   3. There is severe signal to noise artifact related to obesity (BMI 55) but the coronary  arteries appear to be normal without evidence of plaque or stenosis.   4. Limited evaluation of the distal coronary segments.   RECOMMENDATIONS: 1. No evidence of CAD (0%). Consider non-atherosclerotic causes of chest pain. TTE 12/2019: IMPRESSIONS   1. Poor acoustic windows limit study.   2. Global longitudinal strain is -19.2%. Left ventricular ejection  fraction, by estimation, is 55 to 60%. The left ventricle has normal  function. The left ventricle has no regional wall motion abnormalities.  The left ventricular internal cavity size was   mildly dilated. Left ventricular diastolic parameters were normal.   3. Right ventricular systolic function is normal. The right ventricular  size is normal. There is normal pulmonary artery systolic pressure.   4. MR jet is eccentric, directed posterior into LA. Mild to moderate  mitral valve regurgitation.   5. Mild aortic valve sclerosis is present, with no evidence  of aortic  valve stenosis.   6. The inferior vena cava is normal in size with greater than 50%  respiratory variability, suggesting right atrial pressure of 3 mmHg.   12/24/2016: Lexiscan myoview stress test 12/24/2016: 1. This was a 2-day protocol.  The resting electrocardiogram demonstrated normal sinus rhythm, normal resting conduction and no resting arrhythmias. Low voltage.   Stress EKG is non-diagnostic for ischemia as it a pharmacologic stress using Lexiscan. Stress symptoms included dyspnea, nausea and dizziness.  2. SPECT images demonstrate small perfusion abnormality of mild intensity in the apical myocardial wall(s) on the stress images.  These defects are related to apical thinning.  Gated SPECT images reveal normal myocardial thickening and wall motion.  The left ventricular ejection fraction was calculated or visually estimated to be 53%.  This is a low risk study.   EKG:  EKG 06/10/21 NSR, late r-wave progression, no ischemia.   Recent Labs: 05/10/2021: Hemoglobin  13.0; Platelets 428 12/15/2021: BUN 15; Creatinine, Ser 1.18; Potassium 3.9; Sodium 141  Recent Lipid Panel No results found for: "CHOL", "TRIG", "HDL", "CHOLHDL", "VLDL", "LDLCALC", "LDLDIRECT"  Physical Exam:    VS:  There were no vitals taken for this visit.    Wt Readings from Last 3 Encounters:  01/20/22 249 lb (112.9 kg)  12/14/21 261 lb (118.4 kg)  11/17/21 269 lb (122 kg)     GEN: Obese female, comfortable HEENT: Normal NECK: No JVD; No carotid bruits CARDIAC: RRR, no murmurs, rubs, gallops RESPIRATORY:  Clear to auscultation without rales, wheezing or rhonchi  ABDOMEN: Obese, soft, non-tender, non-distended MUSCULOSKELETAL:  Trace pedal edema; No deformity  SKIN: Warm and dry NEUROLOGIC:  Alert and oriented x 3 PSYCHIATRIC:  Normal affect   ASSESSMENT:    No diagnosis found.   PLAN:    In order of problems listed above:  #Dyspnea on Exertion: Reassuring work-up with normal coronary CTA. TTE with LVEF 55-60%, mild-to-moderate MR. Likely related to asthma. -Reassuring cardiac work-up -Continue management of asthma -Continue weight loss management  #Chest Pain: Noncardiac in nature. Possible spasm as she reports she did feel better with nitro during ER visit. Will start low dose imdur to see if helps with symptoms. Also with severe asthma which may be contributing as well as chest wall pain. -Start imdur -Management of asthma per primary  #Mild to moderate MR: -Continue serial monitoring with echoes  #Hypertension: Well controlled today. Managed by PCP. -Cotinue atenolol-chlorthalidone 100-25 -Continue hydralazine '100mg'$  BID -Continue losartan '100mg'$  daily  #Hyperlipidemia -On crestor '20mg'$  daily -Management per PCP  #Morbid Obesity: BMI 52.  -Extensive counseling regarding the importance of weight loss as likely contributing to the pain the joint pain and MSK pain she is having. She is working on diet. Exercise is limited due to SOB but she is trying to  be more active. Will continue to encourage lifestyle changes. -Will look into GLP-1 agonist   Medication Adjustments/Labs and Tests Ordered: Current medicines are reviewed at length with the patient today.  Concerns regarding medicines are outlined above.  No orders of the defined types were placed in this encounter.  No orders of the defined types were placed in this encounter.   There are no Patient Instructions on file for this visit.   Signed, Freada Bergeron, MD  02/19/2022 7:42 PM    [place]

## 2022-02-22 ENCOUNTER — Other Ambulatory Visit: Payer: Self-pay

## 2022-02-22 ENCOUNTER — Encounter: Payer: Self-pay | Admitting: Cardiology

## 2022-02-22 ENCOUNTER — Ambulatory Visit: Payer: Medicaid Other | Attending: Cardiology | Admitting: Cardiology

## 2022-02-22 VITALS — BP 102/70 | HR 68 | Ht 60.0 in | Wt 257.4 lb

## 2022-02-22 DIAGNOSIS — R079 Chest pain, unspecified: Secondary | ICD-10-CM

## 2022-02-22 DIAGNOSIS — E7800 Pure hypercholesterolemia, unspecified: Secondary | ICD-10-CM

## 2022-02-22 DIAGNOSIS — R0609 Other forms of dyspnea: Secondary | ICD-10-CM

## 2022-02-22 DIAGNOSIS — I10 Essential (primary) hypertension: Secondary | ICD-10-CM | POA: Diagnosis not present

## 2022-02-22 DIAGNOSIS — Z6841 Body Mass Index (BMI) 40.0 and over, adult: Secondary | ICD-10-CM

## 2022-02-22 DIAGNOSIS — M62838 Other muscle spasm: Secondary | ICD-10-CM

## 2022-02-22 MED ORDER — HYDRALAZINE HCL 100 MG PO TABS: 100.0000 mg | ORAL_TABLET | Freq: Two times a day (BID) | ORAL | 3 refills | Status: DC

## 2022-02-22 MED ORDER — ATENOLOL-CHLORTHALIDONE 100-25 MG PO TABS: 1.0000 | ORAL_TABLET | Freq: Every day | ORAL | 2 refills | Status: DC

## 2022-02-22 MED ORDER — CYCLOBENZAPRINE HCL 10 MG PO TABS: 10.0000 mg | ORAL_TABLET | Freq: Three times a day (TID) | ORAL | 0 refills | Status: DC | PRN

## 2022-02-22 MED ORDER — LOSARTAN POTASSIUM 100 MG PO TABS: 100.0000 mg | ORAL_TABLET | Freq: Every day | ORAL | 3 refills | Status: DC

## 2022-02-22 MED ORDER — ISOSORBIDE MONONITRATE ER 30 MG PO TB24: 30.0000 mg | ORAL_TABLET | Freq: Every day | ORAL | 3 refills | Status: DC

## 2022-02-22 MED ORDER — ROSUVASTATIN CALCIUM 20 MG PO TABS: 20.0000 mg | ORAL_TABLET | Freq: Every day | ORAL | 3 refills | Status: DC

## 2022-02-22 MED ORDER — FUROSEMIDE 40 MG PO TABS
40.0000 mg | ORAL_TABLET | Freq: Every day | ORAL | 2 refills | Status: AC
Start: 2022-02-22 — End: ?

## 2022-02-22 MED ORDER — POTASSIUM CHLORIDE CRYS ER 20 MEQ PO TBCR: 40.0000 meq | EXTENDED_RELEASE_TABLET | Freq: Every day | ORAL | 3 refills | Status: AC

## 2022-02-22 NOTE — Progress Notes (Signed)
Cardiology Office Note:    Date:  02/22/2022   ID:  Felicia Molina, DOB 01/29/1971, MRN [id number]  PCP:  Durenda Hurt, [place] Cardiologist:  Freada Bergeron, MD  [place] Electrophysiologist:  None   Referring MD: No ref. provider found   History of Present Illness:    Felicia Molina is a 51 y.o. female with a hx of HTN, HLD, fibromyalgia and obesity who presents to clinic for follow-up.  The patient was seen in [place] [facility] on 01/08/20 for episode of sharp, left-sided chest pain.  ECG without ischemic changes. Trop-I <0.01, CK-MB 2, D-dimer 285. Her pain subsided without intervention, however, given her risk factors, she was recommended for stress testing.Unfortantely, she had to leave prior to testing as she had her daughter with her and did not have childcare available. She re-presented to [facility] on 09/15 with recurrence of her left sided and central chest pain. ECG here demonstrated sinus rhythm with low voltage in the precordial leads, prolonged Qtc but no evidence of ischemia.    She was seen in follow-up 09/21 where TTE with EF 55-60%, normal RV, mild-to-moderate MR. Coronary CTA 01/2020 with no significant disease. Ca score 0.  Was last seen on 12/14/21 by Nicholes Rough where she continued to have atypical chest pain. Blood pressure was fluctuating but she was using a wrist cuff. Repeat TTE was recommended.  Today, the patient states that she has a prolapsed uterus, for which she is on pain medication. Before she is able to proceed with surgery, she has been asked to lose 65 more lbs. She continues to work on weight loss; she was 310 lbs in the past. Today she is 257 lbs. Previously she tried to participate in water aerobics with shallow water but this didn't help her. She is also trying to walk for exercise, but she is limited by bilateral knee pain. She is asking if she is eligible for weight loss medications. Unfortunately, her insurance will not cover wegovy  at this time and she is not diabetic and is not a candidate for other GLP-1 agonists.  We reviewed at length current dietary recommendations from a cardiovascular perspective. In the past she has tried cutting out breads, sodas, and caffeine. Usually she drinks Propel water.  While walking short distances, she may develop mild shortness of breath or some chest discomfort.  She denies any palpitations, or peripheral edema. No lightheadedness, headaches, syncope, orthopnea, or PND.   Past Medical History:  Diagnosis Date   Anemia    Asthma    Atypical chest pain 07/28/2018   Back pain, chronic    Fibromyalgia    Hypertension    Mitral regurgitation    Morbid obesity ([place])    Neurogenic bladder    Pain management contract signed    Palpitations 07/28/2018   Panic attacks    Prolonged Q-T interval on ECG    Reflux    Rheumatoid arthritis ([place])     Past Surgical History:  Procedure Laterality Date   BACK SURGERY     L2-4   bladder stimulator     CARPAL TUNNEL RELEASE     TONSILLECTOMY     URETERAL STENT PLACEMENT  2008    Current Medications: Current Meds  Medication Sig   albuterol (VENTOLIN HFA) 108 (90 Base) MCG/ACT inhaler Inhale 2 puffs into the lungs every 4 (four) hours as needed for wheezing or shortness of breath.   budesonide-formoterol (SYMBICORT) 160-4.5 MCG/ACT inhaler Inhale 2 puffs  into the lungs 2 (two) times daily.   buPROPion (WELLBUTRIN SR) 150 MG 12 hr tablet Take 150 mg by mouth 2 (two) times daily.    cetirizine (ZYRTEC) 10 MG tablet Take 10 mg by mouth daily.    cyclobenzaprine (FLEXERIL) 10 MG tablet Take 1 tablet (10 mg total) by mouth 3 (three) times daily as needed for muscle spasms.   fluconazole (DIFLUCAN) 150 MG tablet Take 1 tablet (150 mg total) by mouth daily. Take one dose today and repeat in 3 days.   fluticasone (FLONASE) 50 MCG/ACT nasal spray 2 SPRAYS INTO EACH NOSTRIL DAILY   ipratropium (ATROVENT) 0.06 % nasal spray Place 2 sprays  into the nose 4 (four) times daily.   montelukast (SINGULAIR) 10 MG tablet Take 10 mg by mouth at bedtime.   omeprazole (PRILOSEC) 40 MG capsule Take 40 mg by mouth daily.   ondansetron (ZOFRAN) 4 MG tablet Take 1 tablet (4 mg total) by mouth every 8 (eight) hours as needed for nausea or vomiting.   oxyCODONE (ROXICODONE) 15 MG immediate release tablet Take 15 mg by mouth in the morning, at noon, in the evening, and at bedtime.   predniSONE (STERAPRED UNI-PAK 21 TAB) 10 MG (21) TBPK tablet Take 6 tabs for 2 days, then 5 for 2 days, then 4 for 2 days, then 3 for 2 days, 2 for 2 days, then 1 for 2 days   pregabalin (LYRICA) 75 MG capsule Take 75 mg by mouth 2 (two) times daily.    [DISCONTINUED] atenolol-chlorthalidone (TENORETIC) 100-25 MG tablet Take 1 tablet by mouth daily.    [DISCONTINUED] furosemide (LASIX) 40 MG tablet Take 1 tablet (40 mg total) by mouth daily. May take an extra 1/2 tablet (20 mg) as needed for swelling   [DISCONTINUED] hydrALAZINE (APRESOLINE) 100 MG tablet Take 100 mg by mouth 2 (two) times daily.   [DISCONTINUED] isosorbide mononitrate (IMDUR) 30 MG 24 hr tablet Take 1 tablet (30 mg total) by mouth daily.   [DISCONTINUED] losartan (COZAAR) 100 MG tablet TK 1 T PO DAILY   [DISCONTINUED] metoprolol tartrate (LOPRESSOR) 100 MG tablet Take '100mg'$  tablet 2 hours prior to your cardiac CT scan.   [DISCONTINUED] potassium chloride SA (KLOR-CON M) 20 MEQ tablet Take 2 tablets (40 mEq total) by mouth daily.   [DISCONTINUED] rosuvastatin (CRESTOR) 20 MG tablet Take 20 mg by mouth daily.      Allergies:   Clindamycin/lincomycin, Ibuprofen, Tomato, and Vicodin [hydrocodone-acetaminophen]   Social History   Socioeconomic History   Marital status: Single    Spouse name: Not on file   Number of children: 4   Years of education: Not on file   Highest education level: Not on file  Occupational History   Not on file  Tobacco Use   Smoking status: Former    Types: Cigarettes     Quit date: 92    Years since quitting: 35.8    Passive exposure: Past   Smokeless tobacco: Never  Vaping Use   Vaping Use: Never used  Substance and Sexual Activity   Alcohol use: No   Drug use: No   Sexual activity: Yes    Birth control/protection: None  Other Topics Concern   Not on file  Social History Narrative   Not on file   Social Determinants of Health   Financial Resource Strain: Not on file  Food Insecurity: Not on file  Transportation Needs: Not on file  Physical Activity: Not on file  Stress: Not on file  Social Connections: Not on file     Family History: The patient's *family history includes Cancer in her mother; Colon cancer in her father; Heart attack in her father and mother; Heart disease in her father, mother, and sister.  ROS:   Review of Systems  Constitutional:  Negative for chills and fever.  HENT:  Negative for nosebleeds and tinnitus.   Eyes:  Negative for blurred vision and pain.  Respiratory:  Positive for shortness of breath. Negative for cough, hemoptysis and stridor.   Cardiovascular:  Positive for chest pain. Negative for palpitations, orthopnea, claudication, leg swelling and PND.  Gastrointestinal:  Negative for blood in stool, diarrhea, nausea and vomiting.  Genitourinary:  Negative for dysuria and hematuria.  Musculoskeletal:  Positive for joint pain. Negative for falls.  Neurological:  Negative for dizziness, loss of consciousness and headaches.  Psychiatric/Behavioral:  Negative for depression, hallucinations and substance abuse. The patient does not have insomnia.      EKGs/Labs/Other Studies Reviewed:    The following studies were reviewed today:  Cardiac CTA  12/23/2021: FINDINGS: Non-cardiac: See separate report from [facility]. No significant findings on limited lung and soft tissue windows.   Calcium Score: No calcium noted   Coronary Arteries: Right dominant with no anomalies   LM: Normal   LAD:  Normal   D1: Normal   D2: Normal   D3: Small vessel normal   Circumflex: Normal   OM1: Normal   OM2: Normal   RCA: Normal   PDA: Normal   PLA: Normal   IMPRESSION: 1. Calcium score 0   2.  Normal ascending thoracic aorta 3.0 cm   3.  Normal right dominant coronary arteries  PFT  12/15/2021  ([place]): Quality of Spirometry:  Adequate effort, results reproducible. FVC maneuver did not extend beyond 6 seconds.   Spirometry:  Normal. There is no significant bronchodilator response   Lung Volumes:  Simple restriction. (Reduced TLC, FRC, RV and Normal RV/TLC).   Diffusing Capacity:  Normal. However, patient was unable to reach 90% IVC on DLCO.   Flow-Volume Loop:  Normal flow-volume loop.   Summary:  A mild restrictive defect with normal diffusing capacity is present.  Coronary CTA 01/2020: FINDINGS: Image quality: Average.   Noise artifact is: Moderate signal to noise artifact is present due to obesity (BMI 55).   Coronary Arteries:  Normal coronary origin.  Right dominance.   Left main: The left main is a large caliber vessel with a normal take off from the left coronary cusp that bifurcates to form a left anterior descending artery and a left circumflex artery. There is no plaque or stenosis.   Left anterior descending artery: The LAD is patent without evidence of plaque or stenosis. The LAD gives off 3 patent diagonal branches. There is limited evaluation of the distal segments due to obesity artifact but these appear normal.   Left circumflex artery: The LCX is non-dominant and patent with no evidence of plaque or stenosis. Limited evaluation of the distal LCX due to obesity artifact.   Right coronary artery: The RCA is dominant with normal take off from the right coronary cusp. There is no evidence of plaque or stenosis. The RCA terminates as a PDA and right posterolateral branch without evidence of plaque or stenosis. Again, the distal segments  appear normal but there is obesity artifact.   Right Atrium: Right atrial size is within normal limits.   Right Ventricle: The right ventricular cavity is within normal limits.  Left Atrium: Left atrial size is normal in size with no left atrial appendage filling defect.   Left Ventricle: The ventricular cavity size is within normal limits. There are no stigmata of prior infarction. There is no abnormal filling defect.   Pulmonary arteries: Normal in size without proximal filling defect.   Pulmonary veins: Normal pulmonary venous drainage.   Pericardium: Normal thickness with no significant effusion or calcium present.   Cardiac valves: The aortic valve is trileaflet without significant calcification. The mitral valve is normal structure without significant calcification.   Aorta: Normal caliber with no significant disease.   Extra-cardiac findings: See attached radiology report for non-cardiac structures.   IMPRESSION: 1. Coronary calcium score of 0.   2. Normal coronary origin with right dominance.   3. There is severe signal to noise artifact related to obesity (BMI 55) but the coronary arteries appear to be normal without evidence of plaque or stenosis.   4. Limited evaluation of the distal coronary segments.   RECOMMENDATIONS: 1. No evidence of CAD (0%). Consider non-atherosclerotic causes of chest pain.  TTE 12/2019: IMPRESSIONS   1. Poor acoustic windows limit study.   2. Global longitudinal strain is -19.2%. Left ventricular ejection  fraction, by estimation, is 55 to 60%. The left ventricle has normal  function. The left ventricle has no regional wall motion abnormalities.  The left ventricular internal cavity size was   mildly dilated. Left ventricular diastolic parameters were normal.   3. Right ventricular systolic function is normal. The right ventricular  size is normal. There is normal pulmonary artery systolic pressure.   4. MR jet is  eccentric, directed posterior into LA. Mild to moderate  mitral valve regurgitation.   5. Mild aortic valve sclerosis is present, with no evidence of aortic  valve stenosis.   6. The inferior vena cava is normal in size with greater than 50%  respiratory variability, suggesting right atrial pressure of 3 mmHg.   12/24/2016: Lexiscan myoview stress test 12/24/2016: 1. This was a 2-day protocol.  The resting electrocardiogram demonstrated normal sinus rhythm, normal resting conduction and no resting arrhythmias. Low voltage.   Stress EKG is non-diagnostic for ischemia as it a pharmacologic stress using Lexiscan. Stress symptoms included dyspnea, nausea and dizziness.  2. SPECT images demonstrate small perfusion abnormality of mild intensity in the apical myocardial wall(s) on the stress images.  These defects are related to apical thinning.  Gated SPECT images reveal normal myocardial thickening and wall motion.  The left ventricular ejection fraction was calculated or visually estimated to be 53%.  This is a low risk study.   EKG:  EKG is personally reviewed. 02/22/2022:  EKG was not ordered. 06/10/21: NSR, late r-wave progression, no ischemia.   Recent Labs: 05/10/2021: Hemoglobin 13.0; Platelets 428 12/15/2021: BUN 15; Creatinine, Ser 1.18; Potassium 3.9; Sodium 141   Recent Lipid Panel No results found for: "CHOL", "TRIG", "HDL", "CHOLHDL", "VLDL", "LDLCALC", "LDLDIRECT"  Physical Exam:    VS:  BP 102/70   Pulse 68   Ht 5' (1.524 m)   Wt 257 lb 6.4 oz (116.8 kg)   SpO2 98%   BMI 50.27 kg/m     Wt Readings from Last 3 Encounters:  02/22/22 257 lb 6.4 oz (116.8 kg)  01/20/22 249 lb (112.9 kg)  12/14/21 261 lb (118.4 kg)     GEN: Obese female, comfortable HEENT: Normal NECK: No JVD; No carotid bruits CARDIAC: RRR, no murmurs, rubs, gallops RESPIRATORY:  Clear to auscultation without rales,  wheezing or rhonchi  ABDOMEN: Obese, soft, non-tender, non-distended MUSCULOSKELETAL:   No edema SKIN: Warm and dry NEUROLOGIC:  Alert and oriented x 3 PSYCHIATRIC:  Normal affect   ASSESSMENT:    1. Morbid obesity with BMI of 50.0-59.9, adult (HCC)   2. Chest pain, unspecified type   3. DOE (dyspnea on exertion)   4. Essential hypertension   5. Pure hypercholesterolemia      PLAN:    In order of problems listed above:  #Morbid Obesity: BMI 50. Patient trying hard to lose weight. Discussed diet at length today. -Extensive counseling regarding the importance of weight loss as likely contributing to the pain the joint pain and MSK pain she is having. She is working on diet. Exercise is limited due to joint pain and SOB. GLP-1 not covered by insurance.  -Discussed diet and lifestyle at length today; will trial intermittent fasting and emphasized healthy food choices  #Dyspnea on Exertion: Reassuring work-up with normal coronary CTA. TTE with LVEF 55-60%, mild-to-moderate MR. Likely related to asthma. -Reassuring cardiac work-up -Continue management of asthma -Continue weight loss management  #Chest Pain: Noncardiac in nature. Possible spasm as she reports she did feel better with nitro during ER visit. Will start low dose imdur to see if helps with symptoms. Also with severe asthma which may be contributing as well as chest wall pain. -Start imdur -Management of asthma per primary  #Mild to moderate MR: -Continue serial monitoring with echoes  #Hypertension: Well controlled today. Managed by PCP. -Cotinue atenolol-chlorthalidone 100-25 -Continue hydralazine '100mg'$  BID -Continue losartan '100mg'$  daily  #Hyperlipidemia -On crestor '20mg'$  daily -Management per PCP   Follow-up:  3 months.  Medication Adjustments/Labs and Tests Ordered: Current medicines are reviewed at length with the patient today.  Concerns regarding medicines are outlined above.   No orders of the defined types were placed in this encounter.  Meds ordered this encounter  Medications    rosuvastatin (CRESTOR) 20 MG tablet    Sig: Take 1 tablet (20 mg total) by mouth daily.    Dispense:  90 tablet    Refill:  3   potassium chloride SA (KLOR-CON M) 20 MEQ tablet    Sig: Take 2 tablets (40 mEq total) by mouth daily.    Dispense:  180 tablet    Refill:  3   losartan (COZAAR) 100 MG tablet    Sig: Take 1 tablet (100 mg total) by mouth daily.    Dispense:  90 tablet    Refill:  3   isosorbide mononitrate (IMDUR) 30 MG 24 hr tablet    Sig: Take 1 tablet (30 mg total) by mouth daily.    Dispense:  90 tablet    Refill:  3   hydrALAZINE (APRESOLINE) 100 MG tablet    Sig: Take 1 tablet (100 mg total) by mouth 2 (two) times daily.    Dispense:  180 tablet    Refill:  3   furosemide (LASIX) 40 MG tablet    Sig: Take 1 tablet (40 mg total) by mouth daily. May take an extra 1/2 tablet (20 mg) as needed for swelling    Dispense:  135 tablet    Refill:  2   atenolol-chlorthalidone (TENORETIC) 100-25 MG tablet    Sig: Take 1 tablet by mouth daily.    Dispense:  90 tablet    Refill:  2   Patient Instructions  Medication Instructions:   Your physician recommends that you continue on your current medications as directed. Please  refer to the Current Medication list given to you today.  *If you need a refill on your cardiac medications before your next appointment, please call your pharmacy*    Follow-Up:  3 MONTHS WITH DR. Johney Frame IN THE OFFICE    Other Instructions  Mediterranean Diet A Mediterranean diet refers to food and lifestyle choices that are based on the traditions of countries located on the [place]. It focuses on eating more fruits, vegetables, whole grains, beans, nuts, seeds, and heart-healthy fats, and eating less dairy, meat, eggs, and processed foods with added sugar, salt, and fat. This way of eating has been shown to help prevent certain conditions and improve outcomes for people who have chronic diseases, like kidney disease and heart  disease. What are tips for following this plan? Reading food labels Check the serving size of packaged foods. For foods such as rice and pasta, the serving size refers to the amount of cooked product, not dry. Check the total fat in packaged foods. Avoid foods that have saturated fat or trans fats. Check the ingredient list for added sugars, such as corn syrup. Shopping  Buy a variety of foods that offer a balanced diet, including: Fresh fruits and vegetables (produce). Grains, beans, nuts, and seeds. Some of these may be available in unpackaged forms or large amounts (in bulk). Fresh seafood. Poultry and eggs. Low-fat dairy products. Buy whole ingredients instead of prepackaged foods. Buy fresh fruits and vegetables in-season from local farmers markets. Buy plain frozen fruits and vegetables. If you do not have access to quality fresh seafood, buy precooked frozen shrimp or canned fish, such as tuna, salmon, or sardines. Stock your pantry so you always have certain foods on hand, such as olive oil, canned tuna, canned tomatoes, rice, pasta, and beans. Cooking Cook foods with extra-virgin olive oil instead of using butter or other vegetable oils. Have meat as a side dish, and have vegetables or grains as your main dish. This means having meat in small portions or adding small amounts of meat to foods like pasta or stew. Use beans or vegetables instead of meat in common dishes like chili or lasagna. Experiment with different cooking methods. Try roasting, broiling, steaming, and sauting vegetables. Add frozen vegetables to soups, stews, pasta, or rice. Add nuts or seeds for added healthy fats and plant protein at each meal. You can add these to yogurt, salads, or vegetable dishes. Marinate fish or vegetables using olive oil, lemon juice, garlic, and fresh herbs. Meal planning Plan to eat one vegetarian meal one day each week. Try to work up to two vegetarian meals, if possible. Eat  seafood two or more times a week. Have healthy snacks readily available, such as: Vegetable sticks with hummus. Greek yogurt. Fruit and nut trail mix. Eat balanced meals throughout the week. This includes: Fruit: 2-3 servings a day. Vegetables: 4-5 servings a day. Low-fat dairy: 2 servings a day. Fish, poultry, or lean meat: 1 serving a day. Beans and legumes: 2 or more servings a week. Nuts and seeds: 1-2 servings a day. Whole grains: 6-8 servings a day. Extra-virgin olive oil: 3-4 servings a day. Limit red meat and sweets to only a few servings a month. Lifestyle  Cook and eat meals together with your family, when possible. Drink enough fluid to keep your urine pale yellow. Be physically active every day. This includes: Aerobic exercise like running or swimming. Leisure activities like gardening, walking, or housework. Get 7-8 hours of sleep each night. If recommended  by your health care provider, drink red wine in moderation. This means 1 glass a day for nonpregnant women and 2 glasses a day for men. A glass of wine equals 5 oz (150 mL). What foods should I eat? Fruits Apples. Apricots. Avocado. Berries. Bananas. Cherries. Dates. Figs. Grapes. Lemons. Melon. Oranges. Peaches. Plums. Pomegranate. Vegetables Artichokes. Beets. Broccoli. Cabbage. Carrots. Eggplant. Green beans. Chard. Kale. Spinach. Onions. Leeks. Peas. Squash. Tomatoes. Peppers. Radishes. Grains Whole-grain pasta. Brown rice. Bulgur wheat. Polenta. Couscous. Whole-wheat bread. [place]. Meats and other proteins Beans. Almonds. Sunflower seeds. Pine nuts. Peanuts. [place]. Salmon. Scallops. Shrimp. [place]. Tilapia. Clams. Oysters. Eggs. Poultry without skin. Dairy Low-fat milk. Cheese. Greek yogurt. Fats and oils Extra-virgin olive oil. Avocado oil. Grapeseed oil. Beverages Water. Red wine. Herbal tea. Sweets and desserts Greek yogurt with honey. Baked apples. Poached pears. Trail mix. Seasonings and  condiments Basil. Cilantro. Coriander. Cumin. Mint. Parsley. Sage. Rosemary. Tarragon. Garlic. Oregano. Thyme. Pepper. Balsamic vinegar. Tahini. Hummus. Tomato sauce. Olives. Mushrooms. The items listed above may not be a complete list of foods and beverages you can eat. Contact a dietitian for more information. What foods should I limit? This is a list of foods that should be eaten rarely or only on special occasions. Fruits Fruit canned in syrup. Vegetables Deep-fried potatoes (french fries). Grains Prepackaged pasta or rice dishes. Prepackaged cereal with added sugar. Prepackaged snacks with added sugar. Meats and other proteins Beef. Pork. Lamb. Poultry with skin. Hot dogs. [place] Salines. Dairy Ice cream. Sour cream. Whole milk. Fats and oils Butter. Canola oil. Vegetable oil. Beef fat (tallow). Lard. Beverages Juice. Sugar-sweetened soft drinks. Beer. Liquor and spirits. Sweets and desserts Cookies. Cakes. Pies. Candy. Seasonings and condiments Mayonnaise. Pre-made sauces and marinades. The items listed above may not be a complete list of foods and beverages you should limit. Contact a dietitian for more information. Summary The Mediterranean diet includes both food and lifestyle choices. Eat a variety of fresh fruits and vegetables, beans, nuts, seeds, and whole grains. Limit the amount of red meat and sweets that you eat. If recommended by your health care provider, drink red wine in moderation. This means 1 glass a day for nonpregnant women and 2 glasses a day for men. A glass of wine equals 5 oz (150 mL). This information is not intended to replace advice given to you by your health care provider. Make sure you discuss any questions you have with your health care provider. Document Revised: 05/18/2019 Document Reviewed: 03/15/2019 Elsevier Patient Education  [place]         I,Mathew Stumpf,acting as a scribe for Freada Bergeron, MD.,have documented all relevant documentation on the behalf of Freada Bergeron, MD,as directed by  Freada Bergeron, MD while in the presence of Freada Bergeron, MD.  I, Freada Bergeron, MD, have reviewed all documentation for this visit. The documentation on 02/22/22 for the exam, diagnosis, procedures, and orders are all accurate and complete.   Signed, Freada Bergeron, MD  02/22/2022 2:28 PM    [place]

## 2022-02-22 NOTE — Patient Instructions (Addendum)
Medication Instructions:   Your physician recommends that you continue on your current medications as directed. Please refer to the Current Medication list given to you today.  *If you need a refill on your cardiac medications before your next appointment, please call your pharmacy*    Follow-Up:  3 MONTHS WITH DR. Johney Frame IN THE OFFICE    Other Instructions  Mediterranean Diet A Mediterranean diet refers to food and lifestyle choices that are based on the traditions of countries located on the [place]. It focuses on eating more fruits, vegetables, whole grains, beans, nuts, seeds, and heart-healthy fats, and eating less dairy, meat, eggs, and processed foods with added sugar, salt, and fat. This way of eating has been shown to help prevent certain conditions and improve outcomes for people who have chronic diseases, like kidney disease and heart disease. What are tips for following this plan? Reading food labels Check the serving size of packaged foods. For foods such as rice and pasta, the serving size refers to the amount of cooked product, not dry. Check the total fat in packaged foods. Avoid foods that have saturated fat or trans fats. Check the ingredient list for added sugars, such as corn syrup. Shopping  Buy a variety of foods that offer a balanced diet, including: Fresh fruits and vegetables (produce). Grains, beans, nuts, and seeds. Some of these may be available in unpackaged forms or large amounts (in bulk). Fresh seafood. Poultry and eggs. Low-fat dairy products. Buy whole ingredients instead of prepackaged foods. Buy fresh fruits and vegetables in-season from local farmers markets. Buy plain frozen fruits and vegetables. If you do not have access to quality fresh seafood, buy precooked frozen shrimp or canned fish, such as tuna, salmon, or sardines. Stock your pantry so you always have certain foods on hand, such as olive oil, canned tuna, canned  tomatoes, rice, pasta, and beans. Cooking Cook foods with extra-virgin olive oil instead of using butter or other vegetable oils. Have meat as a side dish, and have vegetables or grains as your main dish. This means having meat in small portions or adding small amounts of meat to foods like pasta or stew. Use beans or vegetables instead of meat in common dishes like chili or lasagna. Experiment with different cooking methods. Try roasting, broiling, steaming, and sauting vegetables. Add frozen vegetables to soups, stews, pasta, or rice. Add nuts or seeds for added healthy fats and plant protein at each meal. You can add these to yogurt, salads, or vegetable dishes. Marinate fish or vegetables using olive oil, lemon juice, garlic, and fresh herbs. Meal planning Plan to eat one vegetarian meal one day each week. Try to work up to two vegetarian meals, if possible. Eat seafood two or more times a week. Have healthy snacks readily available, such as: Vegetable sticks with hummus. Greek yogurt. Fruit and nut trail mix. Eat balanced meals throughout the week. This includes: Fruit: 2-3 servings a day. Vegetables: 4-5 servings a day. Low-fat dairy: 2 servings a day. Fish, poultry, or lean meat: 1 serving a day. Beans and legumes: 2 or more servings a week. Nuts and seeds: 1-2 servings a day. Whole grains: 6-8 servings a day. Extra-virgin olive oil: 3-4 servings a day. Limit red meat and sweets to only a few servings a month. Lifestyle  Cook and eat meals together with your family, when possible. Drink enough fluid to keep your urine pale yellow. Be physically active every day. This includes: Aerobic exercise like running or swimming.  Leisure activities like gardening, walking, or housework. Get 7-8 hours of sleep each night. If recommended by your health care provider, drink red wine in moderation. This means 1 glass a day for nonpregnant women and 2 glasses a day for men. A glass of wine  equals 5 oz (150 mL). What foods should I eat? Fruits Apples. Apricots. Avocado. Berries. Bananas. Cherries. Dates. Figs. Grapes. Lemons. Melon. Oranges. Peaches. Plums. Pomegranate. Vegetables Artichokes. Beets. Broccoli. Cabbage. Carrots. Eggplant. Green beans. Chard. Kale. Spinach. Onions. Leeks. Peas. Squash. Tomatoes. Peppers. Radishes. Grains Whole-grain pasta. Brown rice. Bulgur wheat. Polenta. Couscous. Whole-wheat bread. [place]. Meats and other proteins Beans. Almonds. Sunflower seeds. Pine nuts. Peanuts. Crook. Salmon. Scallops. Shrimp. [place]. Tilapia. Clams. Oysters. Eggs. Poultry without skin. Dairy Low-fat milk. Cheese. Greek yogurt. Fats and oils Extra-virgin olive oil. Avocado oil. Grapeseed oil. Beverages Water. Red wine. Herbal tea. Sweets and desserts Greek yogurt with honey. Baked apples. Poached pears. Trail mix. Seasonings and condiments Basil. Cilantro. Coriander. Cumin. Mint. Parsley. Sage. Rosemary. Tarragon. Garlic. Oregano. Thyme. Pepper. Balsamic vinegar. Tahini. Hummus. Tomato sauce. Olives. Mushrooms. The items listed above may not be a complete list of foods and beverages you can eat. Contact a dietitian for more information. What foods should I limit? This is a list of foods that should be eaten rarely or only on special occasions. Fruits Fruit canned in syrup. Vegetables Deep-fried potatoes (french fries). Grains Prepackaged pasta or rice dishes. Prepackaged cereal with added sugar. Prepackaged snacks with added sugar. Meats and other proteins Beef. Pork. Lamb. Poultry with skin. Hot dogs. [place] Salines. Dairy Ice cream. Sour cream. Whole milk. Fats and oils Butter. Canola oil. Vegetable oil. Beef fat (tallow). Lard. Beverages Juice. Sugar-sweetened soft drinks. Beer. Liquor and spirits. Sweets and desserts Cookies. Cakes. Pies. Candy. Seasonings and condiments Mayonnaise. Pre-made sauces and marinades. The items listed above may not be a  complete list of foods and beverages you should limit. Contact a dietitian for more information. Summary The Mediterranean diet includes both food and lifestyle choices. Eat a variety of fresh fruits and vegetables, beans, nuts, seeds, and whole grains. Limit the amount of red meat and sweets that you eat. If recommended by your health care provider, drink red wine in moderation. This means 1 glass a day for nonpregnant women and 2 glasses a day for men. A glass of wine equals 5 oz (150 mL). This information is not intended to replace advice given to you by your health care provider. Make sure you discuss any questions you have with your health care provider. Document Revised: 05/18/2019 Document Reviewed: 03/15/2019 Elsevier Patient Education  [place]

## 2022-02-23 ENCOUNTER — Ambulatory Visit: Payer: Medicaid Other | Admitting: Obstetrics and Gynecology

## 2022-02-23 NOTE — Progress Notes (Deleted)
[facility] Return Visit  SUBJECTIVE  History of Present Illness: Felicia Molina is a 51 y.o. female seen in follow-up for prolapse, incontinence and levator spasm. Plan at last visit was ***.     Past Medical History: Patient  has a past medical history of Anemia, Asthma, Atypical chest pain (07/28/2018), Back pain, chronic, Fibromyalgia, Hypertension, Mitral regurgitation, Morbid obesity ([place]), Neurogenic bladder, Pain management contract signed, Palpitations (07/28/2018), Panic attacks, Prolonged Q-T interval on ECG, Reflux, and Rheumatoid arthritis ([place]).   Past Surgical History: She  has a past surgical history that includes Back surgery; Carpal tunnel release; Ureteral stent placement (2008); bladder stimulator; and Tonsillectomy.   Medications: She has a current medication list which includes the following prescription(s): albuterol, atenolol-chlorthalidone, budesonide-formoterol, bupropion, cetirizine, cyclobenzaprine, fluconazole, fluticasone, furosemide, hydralazine, ipratropium, isosorbide mononitrate, losartan, montelukast, omeprazole, ondansetron, oxycodone, potassium chloride sa, prednisone, pregabalin, and rosuvastatin.   Allergies: Patient is allergic to clindamycin/lincomycin, ibuprofen, tomato, and vicodin [hydrocodone-acetaminophen].   Social History: Patient  reports that she quit smoking about 35 years ago. Her smoking use included cigarettes. She has been exposed to tobacco smoke. She has never used smokeless tobacco. She reports that she does not drink alcohol and does not use drugs.      OBJECTIVE     Physical Exam: There were no vitals filed for this visit. Gen: No apparent distress, A&O x 3.  Detailed Urogynecologic Evaluation:  Deferred. Prior exam showed:      No data to display             ASSESSMENT AND PLAN    Felicia Molina is a 51 y.o. with:  No diagnosis found.

## 2022-02-26 ENCOUNTER — Other Ambulatory Visit (HOSPITAL_COMMUNITY): Payer: Medicaid Other

## 2022-03-15 ENCOUNTER — Emergency Department (HOSPITAL_BASED_OUTPATIENT_CLINIC_OR_DEPARTMENT_OTHER)
Admission: EM | Admit: 2022-03-15 | Discharge: 2022-03-15 | Disposition: A | Discharge status: Home / Self Care | Payer: Medicaid Other | Attending: Emergency Medicine | Admitting: Emergency Medicine

## 2022-03-15 ENCOUNTER — Encounter (HOSPITAL_BASED_OUTPATIENT_CLINIC_OR_DEPARTMENT_OTHER): Payer: Self-pay

## 2022-03-15 ENCOUNTER — Emergency Department (HOSPITAL_BASED_OUTPATIENT_CLINIC_OR_DEPARTMENT_OTHER): Payer: Medicaid Other

## 2022-03-15 ENCOUNTER — Other Ambulatory Visit: Payer: Self-pay

## 2022-03-15 DIAGNOSIS — J209 Acute bronchitis, unspecified: Secondary | ICD-10-CM | POA: Diagnosis not present

## 2022-03-15 DIAGNOSIS — Z20822 Contact with and (suspected) exposure to covid-19: Secondary | ICD-10-CM | POA: Diagnosis not present

## 2022-03-15 DIAGNOSIS — R059 Cough, unspecified: Secondary | ICD-10-CM | POA: Diagnosis present

## 2022-03-15 LAB — URINALYSIS, ROUTINE W REFLEX MICROSCOPIC
Bilirubin Urine: NEGATIVE
Glucose, UA: NEGATIVE mg/dL
Hgb urine dipstick: NEGATIVE
Ketones, ur: NEGATIVE mg/dL
Leukocytes,Ua: NEGATIVE
Nitrite: NEGATIVE
Protein, ur: 30 mg/dL — AB
Specific Gravity, Urine: 1.02 (ref 1.005–1.030)
pH: 7 (ref 5.0–8.0)

## 2022-03-15 LAB — URINALYSIS, MICROSCOPIC (REFLEX)

## 2022-03-15 LAB — RESP PANEL BY RT-PCR (FLU A&B, COVID) ARPGX2
Influenza A by PCR: NEGATIVE
Influenza B by PCR: NEGATIVE
SARS Coronavirus 2 by RT PCR: NEGATIVE

## 2022-03-15 LAB — PREGNANCY, URINE: Preg Test, Ur: NEGATIVE

## 2022-03-15 MED ORDER — AZITHROMYCIN 250 MG PO TABS: 250.0000 mg | ORAL_TABLET | Freq: Every day | ORAL | 0 refills | Status: AC

## 2022-03-15 NOTE — ED Triage Notes (Signed)
States has had a "bad cold", productive cough with yellow phlegm, shortness of breath. Losing voice. Also states she has "masses" on her uterus that have been bothering her for several months, has seen OBGYN. Taking hydrocodone without relief. C/o lower back pain radiating to abdomen.

## 2022-03-15 NOTE — Discharge Instructions (Signed)
You were seen in the emergency department for cough and using a voice, worsening of your chronic back pain.  Your COVID and flu tests were negative and your chest x-ray did not show any obvious pneumonia.  Your urine did not show any obvious sign of infection.  We are treating you with antibiotics for bronchitis.  Please drink plenty of fluids and rest.  Follow-up with your primary care doctor.  Return to the emergency department if any worsening or concerning symptoms

## 2022-03-15 NOTE — ED Provider Notes (Signed)
[facility] Provider Note   CSN: [id number] Arrival date & time: 03/15/22  0900     History  Chief Complaint  Patient presents with   Back Pain   Cough    Jerry Brugh is a 51 y.o. female.  She is here with a complaint of cough sore throat runny nose that is been going on for 4 days.  She denies any fever.  Cough is productive of some yellow sputum.  It is causing her back and sides to hurt and she has chronic back pain.  She also has a uterine mass that she is following up with her gynecologist.  She said she has a young child with breathing problems and she cannot be sick around him.  Her chronic pain medicines are not helping her back pain.  No vomiting no diarrhea no urinary symptoms.  The history is provided by the patient.  Cough Cough characteristics:  Productive Sputum characteristics:  Yellow Severity:  Moderate Onset quality:  Gradual Duration:  4 days Timing:  Intermittent Progression:  Unchanged Chronicity:  New Smoker: no   Relieved by:  None tried Worsened by:  Nothing Ineffective treatments:  None tried Associated symptoms: myalgias, rhinorrhea and sore throat   Associated symptoms: no fever   Risk factors: no recent infection and no recent travel        Home Medications Prior to Admission medications   Medication Sig Start Date End Date Taking? Authorizing Provider  albuterol (VENTOLIN HFA) 108 (90 Base) MCG/ACT inhaler Inhale 2 puffs into the lungs every 4 (four) hours as needed for wheezing or shortness of breath. 04/07/19   Noemi Chapel, MD  atenolol-chlorthalidone (TENORETIC) 100-25 MG tablet Take 1 tablet by mouth daily. 02/22/22   Freada Bergeron, MD  budesonide-formoterol (SYMBICORT) 160-4.5 MCG/ACT inhaler Inhale 2 puffs into the lungs 2 (two) times daily.    [provider]  buPROPion (WELLBUTRIN SR) 150 MG 12 hr tablet Take 150 mg by mouth 2 (two) times daily.  10/14/17   [provider]   cetirizine (ZYRTEC) 10 MG tablet Take 10 mg by mouth daily.  10/14/17   [provider]  cyclobenzaprine (FLEXERIL) 10 MG tablet Take 1 tablet (10 mg total) by mouth 3 (three) times daily as needed for muscle spasms. 02/22/22   Rasch, Anderson Malta I, NP  fluconazole (DIFLUCAN) 150 MG tablet Take 1 tablet (150 mg total) by mouth daily. Take one dose today and repeat in 3 days. 11/17/21   Rasch, Anderson Malta I, NP  fluticasone (FLONASE) 50 MCG/ACT nasal spray 2 SPRAYS INTO EACH NOSTRIL DAILY 10/14/17   [provider]  furosemide (LASIX) 40 MG tablet Take 1 tablet (40 mg total) by mouth daily. May take an extra 1/2 tablet (20 mg) as needed for swelling 02/22/22   Freada Bergeron, MD  hydrALAZINE (APRESOLINE) 100 MG tablet Take 1 tablet (100 mg total) by mouth 2 (two) times daily. 02/22/22   Freada Bergeron, MD  ipratropium (ATROVENT) 0.06 % nasal spray Place 2 sprays into the nose 4 (four) times daily. 03/04/13   Billy Fischer, MD  isosorbide mononitrate (IMDUR) 30 MG 24 hr tablet Take 1 tablet (30 mg total) by mouth daily. 02/22/22   Freada Bergeron, MD  losartan (COZAAR) 100 MG tablet Take 1 tablet (100 mg total) by mouth daily. 02/22/22   Freada Bergeron, MD  montelukast (SINGULAIR) 10 MG tablet Take 10 mg by mouth at bedtime.    [provider]  omeprazole (PRILOSEC) 40 MG capsule Take 40 mg by mouth daily. 01/03/20   [provider]  ondansetron (ZOFRAN) 4 MG tablet Take 1 tablet (4 mg total) by mouth every 8 (eight) hours as needed for nausea or vomiting. 04/30/20   Marcello Fennel, PA-C  oxyCODONE (ROXICODONE) 15 MG immediate release tablet Take 15 mg by mouth in the morning, at noon, in the evening, and at bedtime. 12/09/21   [provider]  potassium chloride SA (KLOR-CON M) 20 MEQ tablet Take 2 tablets (40 mEq total) by mouth daily. 02/22/22   Freada Bergeron, MD  predniSONE (STERAPRED UNI-PAK 21 TAB) 10 MG (21) TBPK tablet Take 6  tabs for 2 days, then 5 for 2 days, then 4 for 2 days, then 3 for 2 days, 2 for 2 days, then 1 for 2 days 07/24/20   Isla Pence, MD  pregabalin (LYRICA) 75 MG capsule Take 75 mg by mouth 2 (two) times daily.     [provider]  rosuvastatin (CRESTOR) 20 MG tablet Take 1 tablet (20 mg total) by mouth daily. 02/22/22   Freada Bergeron, MD      Allergies    Clindamycin/lincomycin, Ibuprofen, Tomato, and Vicodin [hydrocodone-acetaminophen]    Review of Systems   Review of Systems  Constitutional:  Negative for fever.  HENT:  Positive for rhinorrhea and sore throat.   Respiratory:  Positive for cough.   Gastrointestinal:  Negative for nausea and vomiting.  Genitourinary:  Negative for dysuria.  Musculoskeletal:  Positive for back pain and myalgias.    Physical Exam Updated Vital Signs BP 127/60 (BP Location: Right Arm)   Pulse 84   Temp 98.2 F (36.8 C) (Oral)   Resp (!) 24   Ht 5' (1.524 m)   Wt 114.3 kg   LMP 02/07/2022 (Approximate)   SpO2 100%   BMI 49.22 kg/m  Physical Exam Vitals and nursing note reviewed.  Constitutional:      General: She is not in acute distress.    Appearance: Normal appearance. She is well-developed.  HENT:     Head: Normocephalic and atraumatic.     Right Ear: Tympanic membrane normal.     Left Ear: Tympanic membrane normal.     Mouth/Throat:     Mouth: Mucous membranes are moist.     Pharynx: Oropharynx is clear. No oropharyngeal exudate or posterior oropharyngeal erythema.  Eyes:     Conjunctiva/sclera: Conjunctivae normal.  Cardiovascular:     Rate and Rhythm: Normal rate and regular rhythm.     Heart sounds: No murmur heard. Pulmonary:     Effort: Pulmonary effort is normal. No respiratory distress.     Breath sounds: Normal breath sounds.  Abdominal:     Palpations: Abdomen is soft.     Tenderness: There is no abdominal tenderness. There is no guarding or rebound.  Musculoskeletal:        General: No deformity.  Normal range of motion.     Cervical back: Neck supple.  Skin:    General: Skin is warm and dry.     Capillary Refill: Capillary refill takes less than 2 seconds.  Neurological:     General: No focal deficit present.     Mental Status: She is alert.     ED Results / Procedures / Treatments   Labs (all labs ordered are listed, but only abnormal results are displayed) Labs Reviewed  URINALYSIS, ROUTINE W REFLEX MICROSCOPIC - Abnormal; Notable for the following  components:      Result Value   Protein, ur 30 (*)    All other components within normal limits  URINALYSIS, MICROSCOPIC (REFLEX) - Abnormal; Notable for the following components:   Bacteria, UA RARE (*)    All other components within normal limits  RESP PANEL BY RT-PCR (FLU A&B, COVID) [id number]  PREGNANCY, URINE    EKG None  Radiology DG Chest Port 1 View  Result Date: 03/15/2022 CLINICAL DATA:  Cough, shortness of breath. EXAM: PORTABLE CHEST 1 VIEW COMPARISON:  May 10, 2021. FINDINGS: Stable cardiomediastinal silhouette. Both lungs are clear. The visualized skeletal structures are unremarkable. IMPRESSION: No active disease. Electronically Signed   By: Marijo Conception M.D.   On: 03/15/2022 09:45    Procedures Procedures    Medications Ordered in ED Medications - No data to display  ED Course/ Medical Decision Making/ A&P Clinical Course as of 03/15/22 1719  Mon Mar 15, 2022  0942 Chest x-ray interpreted by me as cardiomegaly no acute infiltrates.  Awaiting radiology reading. [MB]    Clinical Course User Index [MB] Hayden Rasmussen, MD                           Medical Decision Making Amount and/or Complexity of Data Reviewed Labs: ordered. Radiology: ordered.  Risk Prescription drug management.  Ceclia Helfrich was evaluated in Emergency Department on 03/15/2022 for the symptoms described in the history of present illness. She was evaluated in the context of the global COVID-19 pandemic, which  necessitated consideration that the patient might be at risk for infection with the SARS-CoV-2 virus that causes COVID-19. Institutional protocols and algorithms that pertain to the evaluation of patients at risk for COVID-19 are in a state of rapid change based on information released by regulatory bodies including the CDC and federal and state organizations. These policies and algorithms were followed during the patient's care in the ED. This patient complains of cough and congestion; this involves an extensive number of treatment Options and is a complaint that carries with it a high risk of complications and morbidity. The differential includes COVID, flu, pneumonia, bronchitis, dehydration  I ordered, reviewed and interpreted labs, which included COVID and flu negative, urinalysis with negative pregnancy I ordered imaging studies which included chest x-ray and I independently    visualized and interpreted imaging which showed no acute infiltrates Previous records obtained and reviewed in epic no recent admissions Cardiac monitoring reviewed, sinus rhythm Social determinants considered, no significant barriers Critical Interventions: None  After the interventions stated above, I reevaluated the patient and found patient to be oxygenating well on room air in no distress Admission and further testing considered, will cover with antibiotics for bronchitis.  Recommended follow-up with PCP.  Return instructions discussed          Final Clinical Impression(s) / ED Diagnoses Final diagnoses:  Acute bronchitis, unspecified organism    Rx / DC Orders ED Discharge Orders          Ordered    azithromycin (ZITHROMAX) 250 MG tablet  Daily        03/15/22 1007              Hayden Rasmussen, MD 03/15/22 1721

## 2022-04-28 ENCOUNTER — Emergency Department (HOSPITAL_BASED_OUTPATIENT_CLINIC_OR_DEPARTMENT_OTHER)
Admission: EM | Admit: 2022-04-28 | Discharge: 2022-04-28 | Disposition: A | Discharge status: Home / Self Care | Payer: Medicaid Other | Attending: Emergency Medicine | Admitting: Emergency Medicine

## 2022-04-28 ENCOUNTER — Emergency Department (HOSPITAL_BASED_OUTPATIENT_CLINIC_OR_DEPARTMENT_OTHER): Payer: Medicaid Other

## 2022-04-28 ENCOUNTER — Encounter (HOSPITAL_BASED_OUTPATIENT_CLINIC_OR_DEPARTMENT_OTHER): Payer: Self-pay

## 2022-04-28 ENCOUNTER — Telehealth: Payer: Self-pay | Admitting: Cardiology

## 2022-04-28 ENCOUNTER — Other Ambulatory Visit: Payer: Self-pay

## 2022-04-28 DIAGNOSIS — J45909 Unspecified asthma, uncomplicated: Secondary | ICD-10-CM | POA: Diagnosis not present

## 2022-04-28 DIAGNOSIS — R42 Dizziness and giddiness: Secondary | ICD-10-CM | POA: Insufficient documentation

## 2022-04-28 DIAGNOSIS — I10 Essential (primary) hypertension: Secondary | ICD-10-CM | POA: Insufficient documentation

## 2022-04-28 DIAGNOSIS — R079 Chest pain, unspecified: Secondary | ICD-10-CM | POA: Diagnosis present

## 2022-04-28 DIAGNOSIS — R0789 Other chest pain: Secondary | ICD-10-CM | POA: Diagnosis not present

## 2022-04-28 LAB — CBC
HCT: 33.6 % — ABNORMAL LOW (ref 36.0–46.0)
Hemoglobin: 11.3 g/dL — ABNORMAL LOW (ref 12.0–15.0)
MCH: 30.1 pg (ref 26.0–34.0)
MCHC: 33.6 g/dL (ref 30.0–36.0)
MCV: 89.4 fL (ref 80.0–100.0)
Platelets: 300 10*3/uL (ref 150–400)
RBC: 3.76 MIL/uL — ABNORMAL LOW (ref 3.87–5.11)
RDW: 13.4 % (ref 11.5–15.5)
WBC: 8 10*3/uL (ref 4.0–10.5)
nRBC: 0 % (ref 0.0–0.2)

## 2022-04-28 LAB — BASIC METABOLIC PANEL
Anion gap: 10 (ref 5–15)
BUN: 25 mg/dL — ABNORMAL HIGH (ref 6–20)
CO2: 30 mmol/L (ref 22–32)
Calcium: 8.9 mg/dL (ref 8.9–10.3)
Chloride: 101 mmol/L (ref 98–111)
Creatinine, Ser: 1.29 mg/dL — ABNORMAL HIGH (ref 0.44–1.00)
GFR, Estimated: 50 mL/min — ABNORMAL LOW (ref >60)
Glucose, Bld: 103 mg/dL — ABNORMAL HIGH (ref 70–99)
Potassium: 3.3 mmol/L — ABNORMAL LOW (ref 3.5–5.1)
Sodium: 141 mmol/L (ref 135–145)

## 2022-04-28 LAB — TROPONIN I (HIGH SENSITIVITY): Troponin I (High Sensitivity): 3 ng/L (ref <18)

## 2022-04-28 MED ORDER — LIDOCAINE 5 % EX PTCH
1.0000 | MEDICATED_PATCH | CUTANEOUS | Status: DC
  Administered 2022-04-28: 1 via TRANSDERMAL
  Filled 2022-04-28: qty 1

## 2022-04-28 MED ORDER — LIDOCAINE 5 % EX OINT: 1.0000 | TOPICAL_OINTMENT | Freq: Three times a day (TID) | CUTANEOUS | 0 refills | Status: AC | PRN

## 2022-04-28 NOTE — ED Provider Notes (Signed)
Emergency Department Provider Note   I have reviewed the triage vital signs and the nursing notes.   HISTORY  Chief Complaint Chest Pain   HPI Felicia Molina is a 52 y.o. female past history of elevated BMI, hypertension, fibromyalgia presents to the emergency department with electric pain type sensation to the left shoulder down the left arm and into the chest.  No pleuritic pain or shortness of breath.  Pain is worse with movement or touching the chest.  No fevers or chills.  No productive cough.  No pressure/heaviness.  No diaphoresis, nausea, vomiting.  She also felt some lightheadedness this morning after taking her multiple blood pressure medications.  No syncope.    Past Medical History:  Diagnosis Date   Anemia    Asthma    Atypical chest pain 07/28/2018   Back pain, chronic    Fibromyalgia    Hypertension    Mitral regurgitation    Morbid obesity (HCC)    Neurogenic bladder    Pain management contract signed    Palpitations 07/28/2018   Panic attacks    Prolonged Q-T interval on ECG    Reflux    Rheumatoid arthritis ([place])     Review of Systems  Constitutional: No fever/chills Eyes: No visual changes. ENT: No sore throat. Cardiovascular: Positive chest pain. Respiratory: Denies shortness of breath. Gastrointestinal: No abdominal pain.  No nausea, no vomiting.  No diarrhea.  No constipation. Genitourinary: Negative for dysuria. Musculoskeletal: Negative for back pain. Skin: Negative for rash. Neurological: Negative for headaches, focal weakness or numbness.  ____________________________________________   PHYSICAL EXAM:  VITAL SIGNS: ED Triage Vitals  Enc Vitals Group     BP 04/28/22 1026 (!) 88/65     Pulse Rate 04/28/22 1026 69     Resp 04/28/22 1026 20     Temp 04/28/22 1026 97.8 F (36.6 C)     Temp Source 04/28/22 1026 Oral     SpO2 04/28/22 1026 99 %     Weight 04/28/22 1023 252 lb (114.3 kg)     Height 04/28/22 1023 5' (1.524 m)    Constitutional: Alert and oriented. Well appearing and in no acute distress. Eyes: Conjunctivae are normal.  Head: Atraumatic. Nose: No congestion/rhinnorhea. Mouth/Throat: Mucous membranes are moist.   Neck: No stridor.  Cardiovascular: Normal rate, regular rhythm. Good peripheral circulation. Grossly normal heart sounds.  Patient with point tenderness to the left anterior chest wall.  Patient winces and withdraws with touching.  Respiratory: Normal respiratory effort.  No retractions. Lungs CTAB. Gastrointestinal: Soft and nontender. No distention.  Musculoskeletal: No gross deformities of extremities.  Normal range of motion of the left shoulder.  Neurologic:  Normal speech and language. No gross focal neurologic deficits are appreciated.  Skin:  Skin is warm, dry and intact. No rash noted.  ____________________________________________   LABS (all labs ordered are listed, but only abnormal results are displayed)  Labs Reviewed  BASIC METABOLIC PANEL - Abnormal; Notable for the following components:      Result Value   Potassium 3.3 (*)    Glucose, Bld 103 (*)    BUN 25 (*)    Creatinine, Ser 1.29 (*)    GFR, Estimated 50 (*)    All other components within normal limits  CBC - Abnormal; Notable for the following components:   RBC 3.76 (*)    Hemoglobin 11.3 (*)    HCT 33.6 (*)    All other components within normal limits  TROPONIN I (HIGH  SENSITIVITY)   ____________________________________________  EKG   EKG Interpretation  Date/Time:  Wednesday April 28 2022 10:17:39 EST Ventricular Rate:  72 PR Interval:  139 QRS Duration: 84 QT Interval:  443 QTC Calculation: 485 R Axis:   9 Text Interpretation: Sinus rhythm Low voltage, precordial leads Confirmed by Nanda Quinton [phone]) on 04/28/2022 10:25:31 AM       ____________________________________________   PROCEDURES  Procedure(s) performed:   Procedures  None   ____________________________________________   INITIAL IMPRESSION / ASSESSMENT AND PLAN / ED COURSE  Pertinent labs & imaging results that were available during my care of the patient were reviewed by me and considered in my medical decision making (see chart for details).   This patient is Presenting for Evaluation of CP, which does require a range of treatment options, and is a complaint that involves a high risk of morbidity and mortality.  The Differential Diagnoses includes but is not exclusive to acute coronary syndrome, aortic dissection, pulmonary embolism, cardiac tamponade, community-acquired pneumonia, pericarditis, musculoskeletal chest wall pain, etc.   Critical Interventions-    Medications - No data to display   Reassessment after intervention: Pain improved.    Clinical Laboratory Tests Ordered, included troponin negative.  Creatinine 1.29 similar to prior.  Mild anemia at 11.3.  Radiologic Tests Ordered, included CXR. I independently interpreted the images and agree with radiology interpretation.   Cardiac Monitor Tracing which shows NSR.    Medical Decision Making: Summary:  Presents to the emergency department for evaluation of chest pain which is fairly atypical, sharp/electric feeling into the chest.  This pain is directly and easily reproducible on my exam decreasing my suspicion for PE, ACS, dissection although these were considered.  Do plan for screening blood work.  She arrives with slightly hypotensive blood pressure after taking her medicines this morning.  She is on multiple medicines and I am questioning if perhaps this is iatrogenic.  Without intervention her blood pressure improved into the low 100 range which appears baseline for her and review of prior ED records.  Patient also with atypical chest pain presentation in January of last year.  Those records were reviewed as well.  Reevaluation with update and discussion with patient.  Chest pain not  ongoing currently.  Improved with Lidoderm patch.  Considered admission but with reassuring workup here plan for discharge home with PCP follow-up  Patient's presentation is most consistent with acute presentation with potential threat to life or bodily function.   Disposition: discharge  ____________________________________________  FINAL CLINICAL IMPRESSION(S) / ED DIAGNOSES  Final diagnoses:  Atypical chest pain     NEW OUTPATIENT MEDICATIONS STARTED DURING THIS VISIT:  Discharge Medication List as of 04/28/2022 12:01 PM     START taking these medications   Details  lidocaine (XYLOCAINE) 5 % ointment Apply 1 Application topically 3 (three) times daily as needed., Starting Wed 04/28/2022, Normal        Note:  This document was prepared using Dragon voice recognition software and may include unintentional dictation errors.  Nanda Quinton, MD, [facility] Emergency Medicine    Tashika Goodin, [facility], MD 05/04/22 [phone]

## 2022-04-28 NOTE — Telephone Encounter (Signed)
Pt c/o of Chest Pain: STAT if CP now or developed within 24 hours  1. Are you having CP right now?   Yes  2. Are you experiencing any other symptoms (ex. SOB, nausea, vomiting, sweating)?   No  3. How long have you been experiencing CP?   This morning, 30 minutes ago  4. Is your CP continuous or coming and going?  Continuous  5. Have you taken Nitroglycerin?   Does not have  Patient stated she has been having chest pains which stated this morning. ?

## 2022-04-28 NOTE — Telephone Encounter (Signed)
Pt is calling in the office with active chest pain that started about 30 mins ago.  Pt states the chest pain feels like a severe lightning strike to the center of her chest.  She states the chest pain is very concerning to her.     She has no BP/HR readings to provide, due to not having a cuff at home.   Pt states she is very sob.  She denies N/V, diaphoresis, pre-syncopal, or syncopal episodes.   Pt continuously belching while talking to her on the phone.   She said she is really worried about her symptoms and wanted to "stop by the office" to have Dr. Johney Frame take a look at her.   Pt aware that Dr. Johney Frame is out of the office this week.    Advised the pt that given her active chest pain complaints and sob, she needs to call 911 and be transported to the ER for further evaluation.    Pt is refusing EMS but will have her friend drive her there now.  She states she has taken all her cardiac medications this morning.    She is aware that I will still route this message to Dr. Johney Frame to further review this plan, upon return to the office.   Pt verbalized understanding and agrees with this plan.

## 2022-04-28 NOTE — Discharge Instructions (Addendum)
You are seen in the emergency room today with chest discomfort.  Your workup today is reassuring.  He is continue your home medications and follow closely with your primary care physician.

## 2022-04-28 NOTE — ED Triage Notes (Addendum)
Pt c/o L side chest pain radiating down L arm.  Pain score 10/10.  Pt reports "it feels like an electric current or like someone is stabbing me in my chest."   During triage, Pt began c/o lightheadedness.  BP was found to be in high 80s.

## 2022-04-28 NOTE — ED Notes (Signed)
Discharge paperwork reviewed entirely with patient, including Rx's and follow up care. Pain was under control. Pt verbalized understanding as well as all parties involved. No questions or concerns voiced at the time of discharge. No acute distress noted.   Pt ambulated out to PVA without incident or assistance.  

## 2022-05-03 ENCOUNTER — Ambulatory Visit: Payer: Medicaid Other | Admitting: Obstetrics and Gynecology

## 2022-05-21 ENCOUNTER — Other Ambulatory Visit: Payer: Self-pay

## 2022-05-21 MED ORDER — ISOSORBIDE MONONITRATE ER 30 MG PO TB24: 30.0000 mg | ORAL_TABLET | Freq: Every day | ORAL | 2 refills | Status: DC

## 2022-05-21 MED ORDER — ROSUVASTATIN CALCIUM 20 MG PO TABS: 20.0000 mg | ORAL_TABLET | Freq: Every day | ORAL | 2 refills | Status: AC

## 2022-05-21 MED ORDER — HYDRALAZINE HCL 100 MG PO TABS: 100.0000 mg | ORAL_TABLET | Freq: Two times a day (BID) | ORAL | 2 refills | Status: AC

## 2022-05-21 MED ORDER — LOSARTAN POTASSIUM 100 MG PO TABS: 100.0000 mg | ORAL_TABLET | Freq: Every day | ORAL | 2 refills | Status: AC

## 2022-05-24 ENCOUNTER — Telehealth: Payer: Self-pay | Admitting: Cardiology

## 2022-05-24 MED ORDER — ATENOLOL-CHLORTHALIDONE 100-25 MG PO TABS: 1.0000 | ORAL_TABLET | Freq: Every day | ORAL | 2 refills | Status: AC

## 2022-05-24 NOTE — Progress Notes (Deleted)
Cardiology Office Note:    Date:  05/24/2022   ID:  Felicia Molina, DOB August 12, 1970, MRN [id number]  PCP:  Durenda Hurt, CNM  [facility] Cardiologist:  Freada Bergeron, MD  [facility] Electrophysiologist:  None   Referring MD: Daymon Larsen*   History of Present Illness:    Felicia Molina is a 52 y.o. female with a hx of HTN, HLD, fibromyalgia and obesity who presents to clinic for follow-up.  The patient was seen in [place] ER on 01/08/20 for episode of sharp, left-sided chest pain.  ECG without ischemic changes. Trop-I <0.01, CK-MB 2, D-dimer 285. Her pain subsided without intervention, however, given her risk factors, she was recommended for stress testing.Unfortantely, she had to leave prior to testing as she had her daughter with her and did not have childcare available. She re-presented to [facility] ER on 09/15 with recurrence of her left sided and central chest pain. ECG here demonstrated sinus rhythm with low voltage in the precordial leads, prolonged Qtc but no evidence of ischemia.    She was seen in follow-up 12/2019 where TTE with EF 55-60%, normal RV, mild-to-moderate MR. Coronary CTA 01/2020 with no significant disease. Ca score 0.  Was last seen 01/2022 where she was suffering from uterine prolapse. She was recommended to lose 65lbs before surgery and was working on weight loss.  Today, ***   Past Medical History:  Diagnosis Date   Anemia    Asthma    Atypical chest pain 07/28/2018   Back pain, chronic    Fibromyalgia    Hypertension    Mitral regurgitation    Morbid obesity (HCC)    Neurogenic bladder    Pain management contract signed    Palpitations 07/28/2018   Panic attacks    Prolonged Q-T interval on ECG    Reflux    Rheumatoid arthritis ([place])     Past Surgical History:  Procedure Laterality Date   BACK SURGERY     L2-4   bladder stimulator     CARPAL TUNNEL RELEASE     TONSILLECTOMY     URETERAL STENT PLACEMENT  2008     Current Medications: No outpatient medications have been marked as taking for the 05/26/22 encounter (Appointment) with Freada Bergeron, MD.     Allergies:   Clindamycin/lincomycin, Ibuprofen, Tomato, and Vicodin [hydrocodone-acetaminophen]   Social History   Socioeconomic History   Marital status: Single    Spouse name: Not on file   Number of children: 4   Years of education: Not on file   Highest education level: Not on file  Occupational History   Not on file  Tobacco Use   Smoking status: Former    Types: Cigarettes    Quit date: 64    Years since quitting: 36.1    Passive exposure: Past   Smokeless tobacco: Never  Vaping Use   Vaping Use: Never used  Substance and Sexual Activity   Alcohol use: No   Drug use: No   Sexual activity: Yes    Birth control/protection: None  Other Topics Concern   Not on file  Social History Narrative   Not on file   Social Determinants of Health   Financial Resource Strain: Not on file  Food Insecurity: Not on file  Transportation Needs: Not on file  Physical Activity: Not on file  Stress: Not on file  Social Connections: Not on file     Family History: The patient's *family history includes Cancer  in her mother; Colon cancer in her father; Heart attack in her father and mother; Heart disease in her father, mother, and sister.  ROS:   Review of Systems  Constitutional:  Negative for chills and fever.  HENT:  Negative for nosebleeds and tinnitus.   Eyes:  Negative for blurred vision and pain.  Respiratory:  Positive for shortness of breath. Negative for cough, hemoptysis and stridor.   Cardiovascular:  Positive for chest pain. Negative for palpitations, orthopnea, claudication, leg swelling and PND.  Gastrointestinal:  Negative for blood in stool, diarrhea, nausea and vomiting.  Genitourinary:  Negative for dysuria and hematuria.  Musculoskeletal:  Positive for joint pain. Negative for falls.  Neurological:   Negative for dizziness, loss of consciousness and headaches.  Psychiatric/Behavioral:  Negative for depression, hallucinations and substance abuse. The patient does not have insomnia.      EKGs/Labs/Other Studies Reviewed:    The following studies were reviewed today:  Cardiac CTA  12/23/2021: FINDINGS: Non-cardiac: See separate report from [facility]. No significant findings on limited lung and soft tissue windows.   Calcium Score: No calcium noted   Coronary Arteries: Right dominant with no anomalies   LM: Normal   LAD: Normal   D1: Normal   D2: Normal   D3: Small vessel normal   Circumflex: Normal   OM1: Normal   OM2: Normal   RCA: Normal   PDA: Normal   PLA: Normal   IMPRESSION: 1. Calcium score 0   2.  Normal ascending thoracic aorta 3.0 cm   3.  Normal right dominant coronary arteries  PFT  12/15/2021  ([place]): Quality of Spirometry:  Adequate effort, results reproducible. FVC maneuver did not extend beyond 6 seconds.   Spirometry:  Normal. There is no significant bronchodilator response   Lung Volumes:  Simple restriction. (Reduced TLC, FRC, RV and Normal RV/TLC).   Diffusing Capacity:  Normal. However, patient was unable to reach 90% IVC on DLCO.   Flow-Volume Loop:  Normal flow-volume loop.   Summary:  A mild restrictive defect with normal diffusing capacity is present.  Coronary CTA 01/2020: FINDINGS: Image quality: Average.   Noise artifact is: Moderate signal to noise artifact is present due to obesity (BMI 55).   Coronary Arteries:  Normal coronary origin.  Right dominance.   Left main: The left main is a large caliber vessel with a normal take off from the left coronary cusp that bifurcates to form a left anterior descending artery and a left circumflex artery. There is no plaque or stenosis.   Left anterior descending artery: The LAD is patent without evidence of plaque or stenosis. The LAD gives off 3 patent diagonal  branches. There is limited evaluation of the distal segments due to obesity artifact but these appear normal.   Left circumflex artery: The LCX is non-dominant and patent with no evidence of plaque or stenosis. Limited evaluation of the distal LCX due to obesity artifact.   Right coronary artery: The RCA is dominant with normal take off from the right coronary cusp. There is no evidence of plaque or stenosis. The RCA terminates as a PDA and right posterolateral branch without evidence of plaque or stenosis. Again, the distal segments appear normal but there is obesity artifact.   Right Atrium: Right atrial size is within normal limits.   Right Ventricle: The right ventricular cavity is within normal limits.   Left Atrium: Left atrial size is normal in size with no left atrial appendage filling  defect.   Left Ventricle: The ventricular cavity size is within normal limits. There are no stigmata of prior infarction. There is no abnormal filling defect.   Pulmonary arteries: Normal in size without proximal filling defect.   Pulmonary veins: Normal pulmonary venous drainage.   Pericardium: Normal thickness with no significant effusion or calcium present.   Cardiac valves: The aortic valve is trileaflet without significant calcification. The mitral valve is normal structure without significant calcification.   Aorta: Normal caliber with no significant disease.   Extra-cardiac findings: See attached radiology report for non-cardiac structures.   IMPRESSION: 1. Coronary calcium score of 0.   2. Normal coronary origin with right dominance.   3. There is severe signal to noise artifact related to obesity (BMI 55) but the coronary arteries appear to be normal without evidence of plaque or stenosis.   4. Limited evaluation of the distal coronary segments.   RECOMMENDATIONS: 1. No evidence of CAD (0%). Consider non-atherosclerotic causes of chest pain.  TTE  12/2019: IMPRESSIONS   1. Poor acoustic windows limit study.   2. Global longitudinal strain is -19.2%. Left ventricular ejection  fraction, by estimation, is 55 to 60%. The left ventricle has normal  function. The left ventricle has no regional wall motion abnormalities.  The left ventricular internal cavity size was   mildly dilated. Left ventricular diastolic parameters were normal.   3. Right ventricular systolic function is normal. The right ventricular  size is normal. There is normal pulmonary artery systolic pressure.   4. MR jet is eccentric, directed posterior into LA. Mild to moderate  mitral valve regurgitation.   5. Mild aortic valve sclerosis is present, with no evidence of aortic  valve stenosis.   6. The inferior vena cava is normal in size with greater than 50%  respiratory variability, suggesting right atrial pressure of 3 mmHg.   12/24/2016: Lexiscan myoview stress test 12/24/2016: 1. This was a 2-day protocol.  The resting electrocardiogram demonstrated normal sinus rhythm, normal resting conduction and no resting arrhythmias. Low voltage.   Stress EKG is non-diagnostic for ischemia as it a pharmacologic stress using Lexiscan. Stress symptoms included dyspnea, nausea and dizziness.  2. SPECT images demonstrate small perfusion abnormality of mild intensity in the apical myocardial wall(s) on the stress images.  These defects are related to apical thinning.  Gated SPECT images reveal normal myocardial thickening and wall motion.  The left ventricular ejection fraction was calculated or visually estimated to be 53%.  This is a low risk study.   EKG:  EKG is personally reviewed. 02/22/2022:  EKG was not ordered. 06/10/21: NSR, late r-wave progression, no ischemia.   Recent Labs: 04/28/2022: BUN 25; Creatinine, Ser 1.29; Hemoglobin 11.3; Platelets 300; Potassium 3.3; Sodium 141   Recent Lipid Panel No results found for: "CHOL", "TRIG", "HDL", "CHOLHDL", "VLDL", "LDLCALC",  "LDLDIRECT"  Physical Exam:    VS:  There were no vitals taken for this visit.    Wt Readings from Last 3 Encounters:  04/28/22 252 lb (114.3 kg)  03/15/22 252 lb (114.3 kg)  02/22/22 257 lb 6.4 oz (116.8 kg)     GEN: Obese female, comfortable HEENT: Normal NECK: No JVD; No carotid bruits CARDIAC: RRR, no murmurs, rubs, gallops RESPIRATORY:  Clear to auscultation without rales, wheezing or rhonchi  ABDOMEN: Obese, soft, non-tender, non-distended MUSCULOSKELETAL:  No edema SKIN: Warm and dry NEUROLOGIC:  Alert and oriented x 3 PSYCHIATRIC:  Normal affect   ASSESSMENT:    No diagnosis found.  PLAN:    In order of problems listed above:  #Morbid Obesity: BMI 50. Patient trying hard to lose weight. Discussed diet at length today. -Extensive counseling regarding the importance of weight loss as likely contributing to the pain the joint pain and MSK pain she is having. She is working on diet. Exercise is limited due to joint pain and SOB. GLP-1 not covered by insurance.  -Discussed diet and lifestyle at length today; will trial intermittent fasting and emphasized healthy food choices  #Dyspnea on Exertion: Reassuring work-up with normal coronary CTA. TTE with LVEF 55-60%, mild-to-moderate MR. Likely related to asthma. -Reassuring cardiac work-up -Continue management of asthma -Continue weight loss management  #Chest Pain: Noncardiac in nature. Possible spasm as she reports she did feel better with nitro during ER visit. Will start low dose imdur to see if helps with symptoms. Also with severe asthma which may be contributing as well as chest wall pain. -Continue imdur '30mg'$  daily -Management of asthma per primary  #Mild to moderate MR: -Continue serial monitoring with echoes  #Hypertension: Well controlled today. Managed by PCP. -Cotinue atenolol-chlorthalidone 100-25 -Continue hydralazine '100mg'$  BID -Continue losartan '100mg'$  daily -Continue imdur '30mg'$   daily  #Hyperlipidemia -On crestor '20mg'$  daily -Management per PCP   Follow-up:  3 months.  Medication Adjustments/Labs and Tests Ordered: Current medicines are reviewed at length with the patient today.  Concerns regarding medicines are outlined above.   No orders of the defined types were placed in this encounter.  No orders of the defined types were placed in this encounter.  There are no Patient Instructions on file for this visit.     Signed, Freada Bergeron, MD  05/24/2022 4:53 PM    [facility]

## 2022-05-24 NOTE — Telephone Encounter (Signed)
Pt changed to a mail pharmacy.  Has appt to see Dr. Johney Frame 05/26/12.. 90 days supply of Atenolol-chlorthalidone has been sent to [facility]

## 2022-05-24 NOTE — Telephone Encounter (Signed)
*  STAT* If patient is at the pharmacy, call can be transferred to refill team.   1. Which medications need to be refilled? (please list name of each medication and dose if known) atenolol-chlorthalidone (TENORETIC) 100-25 MG tablet   2. Which pharmacy/location (including street and city if local pharmacy) is medication to be sent to? [place], [place]   3. Do they need a 30 day or 90 day supply? 90 day

## 2022-05-26 ENCOUNTER — Ambulatory Visit: Payer: Medicaid Other | Admitting: Cardiology

## 2022-06-05 NOTE — Progress Notes (Deleted)
Cardiology Office Note:    Date:  06/05/2022   ID:  Felicia Molina, DOB 05-18-1970, MRN [id number]  PCP:  Durenda Hurt, CNM  [facility] Cardiologist:  Freada Bergeron, MD  [facility] Electrophysiologist:  None   Referring MD: Daymon Larsen*   History of Present Illness:    Felicia Molina is a 52 y.o. female with a hx of HTN, HLD, fibromyalgia and obesity who presents to clinic for follow-up.  The patient was seen in [place] [facility] on 01/08/20 for episode of sharp, left-sided chest pain.  ECG without ischemic changes. Trop-I <0.01, CK-MB 2, D-dimer 285. Her pain subsided without intervention, however, given her risk factors, she was recommended for stress testing.Unfortantely, she had to leave prior to testing as she had her daughter with her and did not have childcare available. She re-presented to [facility] ER on 09/15 with recurrence of her left sided and central chest pain. ECG here demonstrated sinus rhythm with low voltage in the precordial leads, prolonged Qtc but no evidence of ischemia.    She was seen in follow-up 12/2019 where TTE with EF 55-60%, normal RV, mild-to-moderate MR. Coronary CTA 01/2020 with no significant disease. Ca score 0.  Was last seen 01/2022 where she was suffering from uterine prolapse. She was recommended to lose 65lbs before surgery and was working on weight loss.  Today, ***   Past Medical History:  Diagnosis Date   Anemia    Asthma    Atypical chest pain 07/28/2018   Back pain, chronic    Fibromyalgia    Hypertension    Mitral regurgitation    Morbid obesity (HCC)    Neurogenic bladder    Pain management contract signed    Palpitations 07/28/2018   Panic attacks    Prolonged Q-T interval on ECG    Reflux    Rheumatoid arthritis ([place])     Past Surgical History:  Procedure Laterality Date   BACK SURGERY     L2-4   bladder stimulator     CARPAL TUNNEL RELEASE     TONSILLECTOMY     URETERAL STENT PLACEMENT  2008     Current Medications: No outpatient medications have been marked as taking for the 06/09/22 encounter (Appointment) with Freada Bergeron, MD.     Allergies:   Clindamycin/lincomycin, Ibuprofen, Tomato, and Vicodin [hydrocodone-acetaminophen]   Social History   Socioeconomic History   Marital status: Single    Spouse name: Not on file   Number of children: 4   Years of education: Not on file   Highest education level: Not on file  Occupational History   Not on file  Tobacco Use   Smoking status: Former    Types: Cigarettes    Quit date: 1    Years since quitting: 36.1    Passive exposure: Past   Smokeless tobacco: Never  Vaping Use   Vaping Use: Never used  Substance and Sexual Activity   Alcohol use: No   Drug use: No   Sexual activity: Yes    Birth control/protection: None  Other Topics Concern   Not on file  Social History Narrative   Not on file   Social Determinants of Health   Financial Resource Strain: Not on file  Food Insecurity: Not on file  Transportation Needs: Not on file  Physical Activity: Not on file  Stress: Not on file  Social Connections: Not on file     Family History: The patient's *family history includes Cancer  in her mother; Colon cancer in her father; Heart attack in her father and mother; Heart disease in her father, mother, and sister.  ROS:   Review of Systems  Constitutional:  Negative for chills and fever.  HENT:  Negative for nosebleeds and tinnitus.   Eyes:  Negative for blurred vision and pain.  Respiratory:  Positive for shortness of breath. Negative for cough, hemoptysis and stridor.   Cardiovascular:  Positive for chest pain. Negative for palpitations, orthopnea, claudication, leg swelling and PND.  Gastrointestinal:  Negative for blood in stool, diarrhea, nausea and vomiting.  Genitourinary:  Negative for dysuria and hematuria.  Musculoskeletal:  Positive for joint pain. Negative for falls.  Neurological:   Negative for dizziness, loss of consciousness and headaches.  Psychiatric/Behavioral:  Negative for depression, hallucinations and substance abuse. The patient does not have insomnia.      EKGs/Labs/Other Studies Reviewed:    The following studies were reviewed today:  Cardiac CTA  12/23/2021: FINDINGS: Non-cardiac: See separate report from [facility]. No significant findings on limited lung and soft tissue windows.   Calcium Score: No calcium noted   Coronary Arteries: Right dominant with no anomalies   LM: Normal   LAD: Normal   D1: Normal   D2: Normal   D3: Small vessel normal   Circumflex: Normal   OM1: Normal   OM2: Normal   RCA: Normal   PDA: Normal   PLA: Normal   IMPRESSION: 1. Calcium score 0   2.  Normal ascending thoracic aorta 3.0 cm   3.  Normal right dominant coronary arteries  PFT  12/15/2021  ([place]): Quality of Spirometry:  Adequate effort, results reproducible. FVC maneuver did not extend beyond 6 seconds.   Spirometry:  Normal. There is no significant bronchodilator response   Lung Volumes:  Simple restriction. (Reduced TLC, FRC, RV and Normal RV/TLC).   Diffusing Capacity:  Normal. However, patient was unable to reach 90% IVC on DLCO.   Flow-Volume Loop:  Normal flow-volume loop.   Summary:  A mild restrictive defect with normal diffusing capacity is present.  Coronary CTA 01/2020: FINDINGS: Image quality: Average.   Noise artifact is: Moderate signal to noise artifact is present due to obesity (BMI 55).   Coronary Arteries:  Normal coronary origin.  Right dominance.   Left main: The left main is a large caliber vessel with a normal take off from the left coronary cusp that bifurcates to form a left anterior descending artery and a left circumflex artery. There is no plaque or stenosis.   Left anterior descending artery: The LAD is patent without evidence of plaque or stenosis. The LAD gives off 3 patent diagonal  branches. There is limited evaluation of the distal segments due to obesity artifact but these appear normal.   Left circumflex artery: The LCX is non-dominant and patent with no evidence of plaque or stenosis. Limited evaluation of the distal LCX due to obesity artifact.   Right coronary artery: The RCA is dominant with normal take off from the right coronary cusp. There is no evidence of plaque or stenosis. The RCA terminates as a PDA and right posterolateral branch without evidence of plaque or stenosis. Again, the distal segments appear normal but there is obesity artifact.   Right Atrium: Right atrial size is within normal limits.   Right Ventricle: The right ventricular cavity is within normal limits.   Left Atrium: Left atrial size is normal in size with no left atrial appendage filling  defect.   Left Ventricle: The ventricular cavity size is within normal limits. There are no stigmata of prior infarction. There is no abnormal filling defect.   Pulmonary arteries: Normal in size without proximal filling defect.   Pulmonary veins: Normal pulmonary venous drainage.   Pericardium: Normal thickness with no significant effusion or calcium present.   Cardiac valves: The aortic valve is trileaflet without significant calcification. The mitral valve is normal structure without significant calcification.   Aorta: Normal caliber with no significant disease.   Extra-cardiac findings: See attached radiology report for non-cardiac structures.   IMPRESSION: 1. Coronary calcium score of 0.   2. Normal coronary origin with right dominance.   3. There is severe signal to noise artifact related to obesity (BMI 55) but the coronary arteries appear to be normal without evidence of plaque or stenosis.   4. Limited evaluation of the distal coronary segments.   RECOMMENDATIONS: 1. No evidence of CAD (0%). Consider non-atherosclerotic causes of chest pain.  TTE  12/2019: IMPRESSIONS   1. Poor acoustic windows limit study.   2. Global longitudinal strain is -19.2%. Left ventricular ejection  fraction, by estimation, is 55 to 60%. The left ventricle has normal  function. The left ventricle has no regional wall motion abnormalities.  The left ventricular internal cavity size was   mildly dilated. Left ventricular diastolic parameters were normal.   3. Right ventricular systolic function is normal. The right ventricular  size is normal. There is normal pulmonary artery systolic pressure.   4. MR jet is eccentric, directed posterior into LA. Mild to moderate  mitral valve regurgitation.   5. Mild aortic valve sclerosis is present, with no evidence of aortic  valve stenosis.   6. The inferior vena cava is normal in size with greater than 50%  respiratory variability, suggesting right atrial pressure of 3 mmHg.   12/24/2016: Lexiscan myoview stress test 12/24/2016: 1. This was a 2-day protocol.  The resting electrocardiogram demonstrated normal sinus rhythm, normal resting conduction and no resting arrhythmias. Low voltage.   Stress EKG is non-diagnostic for ischemia as it a pharmacologic stress using Lexiscan. Stress symptoms included dyspnea, nausea and dizziness.  2. SPECT images demonstrate small perfusion abnormality of mild intensity in the apical myocardial wall(s) on the stress images.  These defects are related to apical thinning.  Gated SPECT images reveal normal myocardial thickening and wall motion.  The left ventricular ejection fraction was calculated or visually estimated to be 53%.  This is a low risk study.   EKG:  EKG is personally reviewed. 02/22/2022:  EKG was not ordered. 06/10/21: NSR, late r-wave progression, no ischemia.   Recent Labs: 04/28/2022: BUN 25; Creatinine, Ser 1.29; Hemoglobin 11.3; Platelets 300; Potassium 3.3; Sodium 141   Recent Lipid Panel No results found for: "CHOL", "TRIG", "HDL", "CHOLHDL", "VLDL", "LDLCALC",  "LDLDIRECT"  Physical Exam:    VS:  There were no vitals taken for this visit.    Wt Readings from Last 3 Encounters:  04/28/22 252 lb (114.3 kg)  03/15/22 252 lb (114.3 kg)  02/22/22 257 lb 6.4 oz (116.8 kg)     GEN: Obese female, comfortable HEENT: Normal NECK: No JVD; No carotid bruits CARDIAC: RRR, no murmurs, rubs, gallops RESPIRATORY:  Clear to auscultation without rales, wheezing or rhonchi  ABDOMEN: Obese, soft, non-tender, non-distended MUSCULOSKELETAL:  No edema SKIN: Warm and dry NEUROLOGIC:  Alert and oriented x 3 PSYCHIATRIC:  Normal affect   ASSESSMENT:    No diagnosis found.  PLAN:    In order of problems listed above:  #Morbid Obesity: BMI 50. Patient trying hard to lose weight. Discussed diet at length today. -Extensive counseling regarding the importance of weight loss as likely contributing to the pain the joint pain and MSK pain she is having. She is working on diet. Exercise is limited due to joint pain and SOB. GLP-1 not covered by insurance.  -Discussed diet and lifestyle at length today; will trial intermittent fasting and emphasized healthy food choices  #Dyspnea on Exertion: Reassuring work-up with normal coronary CTA. TTE with LVEF 55-60%, mild-to-moderate MR. Likely related to asthma. -Reassuring cardiac work-up -Continue management of asthma -Continue weight loss management  #Chest Pain: Noncardiac in nature. Possible spasm as she reports she did feel better with nitro during ER visit. Will start low dose imdur to see if helps with symptoms. Also with severe asthma which may be contributing as well as chest wall pain. -Continue imdur 62m daily -Management of asthma per primary  #Mild to moderate MR: -Continue serial monitoring with echoes  #Hypertension: Well controlled today. Managed by PCP. -Cotinue atenolol-chlorthalidone 100-25 -Continue hydralazine 1028mBID -Continue losartan 1006maily -Continue imdur 10m45maily  #Hyperlipidemia -On crestor 20mg26mly -Management per PCP   Follow-up:  3 months.  Medication Adjustments/Labs and Tests Ordered: Current medicines are reviewed at length with the patient today.  Concerns regarding medicines are outlined above.   No orders of the defined types were placed in this encounter.  No orders of the defined types were placed in this encounter.  There are no Patient Instructions on file for this visit.     Signed, HeathFreada Bergeron 06/05/2022 8:40 PM    [facility]

## 2022-06-09 ENCOUNTER — Ambulatory Visit: Payer: Medicaid Other | Admitting: Cardiology

## 2022-06-14 ENCOUNTER — Encounter: Payer: Self-pay | Admitting: *Deleted

## 2022-06-30 ENCOUNTER — Ambulatory Visit: Payer: Medicaid Other | Admitting: Obstetrics and Gynecology

## 2022-08-20 ENCOUNTER — Ambulatory Visit: Payer: Medicaid Other | Admitting: Obstetrics and Gynecology

## 2022-08-20 NOTE — Progress Notes (Deleted)
[facility] Return Visit  SUBJECTIVE  History of Present Illness: Copelyn Werden is a 52 y.o. female seen in follow-up for ***. Plan at last visit was ***.     Past Medical History: Patient  has a past medical history of Anemia, Asthma, Atypical chest pain (07/28/2018), Back pain, chronic, Fibromyalgia, Hypertension, Mitral regurgitation, Morbid obesity (HCC), Neurogenic bladder, Pain management contract signed, Palpitations (07/28/2018), Panic attacks, Prolonged Q-T interval on ECG, Reflux, and Rheumatoid arthritis (HCC).   Past Surgical History: She  has a past surgical history that includes Back surgery; Carpal tunnel release; Ureteral stent placement (2008); bladder stimulator; and Tonsillectomy.   Medications: She has a current medication list which includes the following prescription(s): albuterol, atenolol-chlorthalidone, azithromycin, budesonide-formoterol, bupropion, cetirizine, cyclobenzaprine, fluconazole, fluticasone, furosemide, hydralazine, ipratropium, isosorbide mononitrate, lidocaine, losartan, montelukast, omeprazole, ondansetron, oxycodone, potassium chloride sa, prednisone, pregabalin, and rosuvastatin.   Allergies: Patient is allergic to clindamycin/lincomycin, ibuprofen, tomato, and vicodin [hydrocodone-acetaminophen].   Social History: Patient  reports that she quit smoking about 36 years ago. Her smoking use included cigarettes. She has been exposed to tobacco smoke. She has never used smokeless tobacco. She reports that she does not drink alcohol and does not use drugs.      OBJECTIVE     Physical Exam: There were no vitals filed for this visit. Gen: No apparent distress, A&O x 3.  Detailed Urogynecologic Evaluation:  Deferred. Prior exam showed:      No data to display             ASSESSMENT AND PLAN    Ms. Beal is a 52 y.o. with:  No diagnosis found.

## 2022-11-14 ENCOUNTER — Encounter (HOSPITAL_BASED_OUTPATIENT_CLINIC_OR_DEPARTMENT_OTHER): Payer: Self-pay

## 2022-11-14 ENCOUNTER — Emergency Department (HOSPITAL_BASED_OUTPATIENT_CLINIC_OR_DEPARTMENT_OTHER): Payer: Medicaid Other

## 2022-11-14 ENCOUNTER — Other Ambulatory Visit: Payer: Self-pay

## 2022-11-14 ENCOUNTER — Emergency Department (HOSPITAL_BASED_OUTPATIENT_CLINIC_OR_DEPARTMENT_OTHER)
Admission: EM | Admit: 2022-11-14 | Discharge: 2022-11-14 | Discharge status: Against Medical Advice | Payer: Medicaid Other | Attending: Emergency Medicine | Admitting: Emergency Medicine

## 2022-11-14 DIAGNOSIS — R0602 Shortness of breath: Secondary | ICD-10-CM | POA: Insufficient documentation

## 2022-11-14 DIAGNOSIS — Z5321 Procedure and treatment not carried out due to patient leaving prior to being seen by health care provider: Secondary | ICD-10-CM | POA: Diagnosis not present

## 2022-11-14 DIAGNOSIS — R0789 Other chest pain: Secondary | ICD-10-CM | POA: Diagnosis present

## 2022-11-14 LAB — URINALYSIS, ROUTINE W REFLEX MICROSCOPIC
Bilirubin Urine: NEGATIVE
Glucose, UA: NEGATIVE mg/dL
Hgb urine dipstick: NEGATIVE
Ketones, ur: NEGATIVE mg/dL
Leukocytes,Ua: NEGATIVE
Nitrite: NEGATIVE
Protein, ur: NEGATIVE mg/dL
Specific Gravity, Urine: 1.02 (ref 1.005–1.030)
pH: 7 (ref 5.0–8.0)

## 2022-11-14 LAB — BASIC METABOLIC PANEL
Anion gap: 11 (ref 5–15)
BUN: 14 mg/dL (ref 6–20)
CO2: 26 mmol/L (ref 22–32)
Calcium: 8.9 mg/dL (ref 8.9–10.3)
Chloride: 100 mmol/L (ref 98–111)
Creatinine, Ser: 1.26 mg/dL — ABNORMAL HIGH (ref 0.44–1.00)
GFR, Estimated: 51 mL/min — ABNORMAL LOW (ref >60)
Glucose, Bld: 94 mg/dL (ref 70–99)
Potassium: 3.8 mmol/L (ref 3.5–5.1)
Sodium: 137 mmol/L (ref 135–145)

## 2022-11-14 LAB — CBC
HCT: 37.1 % (ref 36.0–46.0)
Hemoglobin: 12.1 g/dL (ref 12.0–15.0)
MCH: 29.8 pg (ref 26.0–34.0)
MCHC: 32.6 g/dL (ref 30.0–36.0)
MCV: 91.4 fL (ref 80.0–100.0)
Platelets: 365 10*3/uL (ref 150–400)
RBC: 4.06 MIL/uL (ref 3.87–5.11)
RDW: 13.2 % (ref 11.5–15.5)
WBC: 9.4 10*3/uL (ref 4.0–10.5)
nRBC: 0 % (ref 0.0–0.2)

## 2022-11-14 LAB — PREGNANCY, URINE: Preg Test, Ur: NEGATIVE

## 2022-11-14 NOTE — ED Notes (Signed)
Pt had to abruptly leave due to leaving a pot on the stove and no one was home. Left prior to triage complete

## 2022-11-14 NOTE — ED Triage Notes (Signed)
Chest pain across chest, radiates down rt. Arm +SHOB denies N/V Describes pain like she is getting shocked & constant

## 2022-11-15 ENCOUNTER — Emergency Department (HOSPITAL_BASED_OUTPATIENT_CLINIC_OR_DEPARTMENT_OTHER)
Admission: EM | Admit: 2022-11-15 | Discharge: 2022-11-15 | Disposition: A | Discharge status: Home / Self Care | Payer: Medicaid Other | Source: Home / Self Care | Attending: Emergency Medicine | Admitting: Emergency Medicine

## 2022-11-15 DIAGNOSIS — R0789 Other chest pain: Secondary | ICD-10-CM

## 2022-11-15 LAB — D-DIMER, QUANTITATIVE: D-Dimer, Quant: 0.38 ug/mL-FEU (ref 0.00–0.50)

## 2022-11-15 LAB — TROPONIN I (HIGH SENSITIVITY): Troponin I (High Sensitivity): 3 ng/L (ref <18)

## 2022-11-15 MED ORDER — CYCLOBENZAPRINE HCL 5 MG PO TABS
5.0000 mg | ORAL_TABLET | Freq: Once | ORAL | Status: AC
  Administered 2022-11-15: 5 mg via ORAL
  Filled 2022-11-15: qty 1

## 2022-11-15 MED ORDER — SODIUM CHLORIDE 0.9 % IV BOLUS: 1000.0000 mL | Freq: Once | INTRAVENOUS | Status: DC

## 2022-11-15 MED ORDER — CYCLOBENZAPRINE HCL 10 MG PO TABS: 10.0000 mg | ORAL_TABLET | Freq: Two times a day (BID) | ORAL | 0 refills | Status: AC | PRN

## 2022-11-15 MED ORDER — FENTANYL CITRATE PF 50 MCG/ML IJ SOSY
50.0000 ug | PREFILLED_SYRINGE | Freq: Once | INTRAMUSCULAR | Status: AC
  Administered 2022-11-15: 50 ug via INTRAVENOUS
  Filled 2022-11-15: qty 1

## 2022-11-15 NOTE — ED Provider Notes (Signed)
[place] [facility] Provider Note   CSN: [id number] Arrival date & time: 11/14/22  2301     History  Chief Complaint  Patient presents with   Chest Pain    Felicia Molina is a 52 y.o. female history of hypertension here presenting with chest pain.  Patient states that she has mitral regurg and follows up with cardiology.  Patient states that she started having left-sided chest pain radiating down the left arm since yesterday.  Patient is concerned that she had a heart attack.  Also had some subjective shortness of breath as well.  Patient has no known CAD or stents.  Patient came in earlier in the day and had negative troponin but left without being seen and came back for evaluation.  Denies any recent travel or history of blood clots  The history is provided by the patient.       Home Medications Prior to Admission medications   Medication Sig Start Date End Date Taking? Authorizing Provider  albuterol (VENTOLIN HFA) 108 (90 Base) MCG/ACT inhaler Inhale 2 puffs into the lungs every 4 (four) hours as needed for wheezing or shortness of breath. 04/07/19   Eber Hong, MD  atenolol-chlorthalidone (TENORETIC) 100-25 MG tablet Take 1 tablet by mouth daily. 05/24/22   Meriam Sprague, MD  azithromycin (ZITHROMAX) 250 MG tablet Take 1 tablet (250 mg total) by mouth daily. Take first 2 tablets together, then 1 every day until finished. 03/15/22   Terrilee Files, MD  budesonide-formoterol [facility]) 160-4.5 MCG/ACT inhaler Inhale 2 puffs into the lungs 2 (two) times daily.    [provider]  buPROPion (WELLBUTRIN SR) 150 MG 12 hr tablet Take 150 mg by mouth 2 (two) times daily.  10/14/17   [provider]  cetirizine (ZYRTEC) 10 MG tablet Take 10 mg by mouth daily.  10/14/17   [provider]  cyclobenzaprine (FLEXERIL) 10 MG tablet Take 1 tablet (10 mg total) by mouth 3 (three) times daily as needed for muscle spasms.  02/22/22   Rasch, Victorino Dike I, NP  fluconazole (DIFLUCAN) 150 MG tablet Take 1 tablet (150 mg total) by mouth daily. Take one dose today and repeat in 3 days. 11/17/21   Rasch, Victorino Dike I, NP  fluticasone (FLONASE) 50 MCG/ACT nasal spray 2 SPRAYS INTO EACH NOSTRIL DAILY 10/14/17   [provider]  furosemide (LASIX) 40 MG tablet Take 1 tablet (40 mg total) by mouth daily. May take an extra 1/2 tablet (20 mg) as needed for swelling 02/22/22   Meriam Sprague, MD  hydrALAZINE (APRESOLINE) 100 MG tablet Take 1 tablet (100 mg total) by mouth 2 (two) times daily. 05/21/22   Meriam Sprague, MD  ipratropium (ATROVENT) 0.06 % nasal spray Place 2 sprays into the nose 4 (four) times daily. 03/04/13   Linna Hoff, MD  isosorbide mononitrate (IMDUR) 30 MG 24 hr tablet Take 1 tablet (30 mg total) by mouth daily. 05/21/22   Meriam Sprague, MD  lidocaine (XYLOCAINE) 5 % ointment Apply 1 Application topically 3 (three) times daily as needed. 04/28/22   Long, Arlyss Repress, MD  losartan (COZAAR) 100 MG tablet Take 1 tablet (100 mg total) by mouth daily. 05/21/22   Meriam Sprague, MD  montelukast (SINGULAIR) 10 MG tablet Take 10 mg by mouth at bedtime.    [provider]  omeprazole (PRILOSEC) 40 MG capsule Take 40 mg by mouth daily. 01/03/20   [provider]  ondansetron (  ZOFRAN) 4 MG tablet Take 1 tablet (4 mg total) by mouth every 8 (eight) hours as needed for nausea or vomiting. 04/30/20   Carroll Sage, PA-C  oxyCODONE (ROXICODONE) 15 MG immediate release tablet Take 15 mg by mouth in the morning, at noon, in the evening, and at bedtime. 12/09/21   [provider]  potassium chloride SA (KLOR-CON M) 20 MEQ tablet Take 2 tablets (40 mEq total) by mouth daily. 02/22/22   Meriam Sprague, MD  predniSONE (STERAPRED UNI-PAK 21 TAB) 10 MG (21) TBPK tablet Take 6 tabs for 2 days, then 5 for 2 days, then 4 for 2 days, then 3 for 2 days, 2 for 2 days, then 1 for 2 days  07/24/20   Jacalyn Lefevre, MD  pregabalin (LYRICA) 75 MG capsule Take 75 mg by mouth 2 (two) times daily.     [provider]  rosuvastatin (CRESTOR) 20 MG tablet Take 1 tablet (20 mg total) by mouth daily. 05/21/22   Meriam Sprague, MD      Allergies    Clindamycin/lincomycin, Ibuprofen, Tomato, and Vicodin [hydrocodone-acetaminophen]    Review of Systems   Review of Systems  Cardiovascular:  Positive for chest pain.  All other systems reviewed and are negative.   Physical Exam Updated Vital Signs BP 95/68 (BP Location: Left Arm)   Pulse 72   Temp 98.5 F (36.9 C) (Tympanic)   Resp 18   Ht 5' (1.524 m)   Wt 120 kg   LMP 10/04/2022   SpO2 100%   BMI 51.67 kg/m  Physical Exam Vitals and nursing note reviewed.  Constitutional:      Appearance: She is well-developed.  HENT:     Head: Normocephalic.  Eyes:     Extraocular Movements: Extraocular movements intact.     Pupils: Pupils are equal, round, and reactive to light.  Cardiovascular:     Rate and Rhythm: Normal rate and regular rhythm.     Heart sounds: Normal heart sounds.  Pulmonary:     Effort: Pulmonary effort is normal.     Comments: Mild reproducible left chest wall tenderness Abdominal:     General: Bowel sounds are normal.     Palpations: Abdomen is soft.  Musculoskeletal:        General: Normal range of motion.     Cervical back: Normal range of motion and neck supple.  Skin:    General: Skin is warm.     Capillary Refill: Capillary refill takes less than 2 seconds.  Neurological:     General: No focal deficit present.     Mental Status: She is alert and oriented to person, place, and time.  Psychiatric:        Mood and Affect: Mood normal.        Behavior: Behavior normal.     ED Results / Procedures / Treatments   Labs (all labs ordered are listed, but only abnormal results are displayed) Labs Reviewed  BASIC METABOLIC PANEL - Abnormal; Notable for the following components:       Result Value   Creatinine, Ser 1.26 (*)    GFR, Estimated 51 (*)    All other components within normal limits  URINALYSIS, ROUTINE W REFLEX MICROSCOPIC - Abnormal; Notable for the following components:   APPearance HAZY (*)    All other components within normal limits  CBC  PREGNANCY, URINE  D-DIMER, QUANTITATIVE  TROPONIN I (HIGH SENSITIVITY)    EKG EKG Interpretation Date/Time:  Sunday  November 14 2022 23:34:31 EDT Ventricular Rate:  68 PR Interval:  156 QRS Duration:  70 QT Interval:  454 QTC Calculation: 482 R Axis:   9  Text Interpretation: Normal sinus rhythm Low voltage QRS Prolonged QT Abnormal ECG When compared with ECG of 28-Apr-2022 10:17, PREVIOUS ECG IS PRESENT Confirmed by Richardean Canal [phone]) on 11/14/2022 11:40:06 PM  Radiology DG Chest 2 View  Result Date: 11/14/2022 CLINICAL DATA:  Chest pain EXAM: CHEST - 2 VIEW COMPARISON:  Chest x-ray 04/28/2022 FINDINGS: The heart size and mediastinal contours are within normal limits. Both lungs are clear. The visualized skeletal structures are unremarkable. IMPRESSION: No active cardiopulmonary disease. Electronically Signed   By: Darliss Cheney M.D.   On: 11/14/2022 23:59    Procedures Procedures    Medications Ordered in ED Medications  fentaNYL (SUBLIMAZE) injection 50 mcg (50 mcg Intravenous Given 11/15/22 0129)  cyclobenzaprine (FLEXERIL) tablet 5 mg (5 mg Oral Given 11/15/22 0129)    ED Course/ Medical Decision Making/ A&P                             Medical Decision Making Felicia Molina is a 52 y.o. female here presenting with chest pain.  Patient has left-sided chest pain and shortness. Pain is reproducible and I think likely MSK in nature.  Patient had 1 negative troponin earlier today.  Will get a second troponin and get D-dimer to rule out PE.  Also give muscle relaxant and reassess  1:54 AM Troponin unremarkable.  Reviewed patient's labs and they were unremarkable.  Chest x-ray normal.  D-dimer is normal.   Stable for discharge  Problems Addressed: Chest wall pain: acute illness or injury  Amount and/or Complexity of Data Reviewed Labs: ordered. Decision-making details documented in ED Course. Radiology: ordered and independent interpretation performed. Decision-making details documented in ED Course.  Risk Prescription drug management.    Final Clinical Impression(s) / ED Diagnoses Final diagnoses:  None    Rx / DC Orders ED Discharge Orders     None         Charlynne Pander, MD 11/15/22 [phone]

## 2022-11-15 NOTE — Discharge Instructions (Signed)
As we discussed, your heart enzyme test is normal.  Please take ibuprofen for pain and I have prescribed Flexeril for muscle spasm  See your doctor up   Return to ER if you have worse chest pain, trouble breathing

## 2023-02-24 ENCOUNTER — Ambulatory Visit: Payer: Medicaid Other | Admitting: Internal Medicine

## 2023-02-28 ENCOUNTER — Other Ambulatory Visit: Payer: Self-pay

## 2023-02-28 MED ORDER — ISOSORBIDE MONONITRATE ER 30 MG PO TB24: 30.0000 mg | ORAL_TABLET | Freq: Every day | ORAL | 0 refills | Status: AC

## 2023-03-13 NOTE — Progress Notes (Deleted)
[facility]  Cardiology Office Note:   Date:  03/13/2023  ID:  Felicia Molina, DOB December 28, 1970, MRN [id number] PCP:  Fredric Dine, CNM  [facility] Providers Cardiologist:  Alverda Skeans, MD Referring MD: Candyce Churn*  Chief Complaint/Reason for Referral: Cardiology follow-up ASSESSMENT:    1. Precordial pain   2. Primary hypertension   3. Stage 3a chronic kidney disease (HCC)   4. BMI 50.0-59.9, adult (HCC)   5. DOE (dyspnea on exertion)     PLAN:   In order of problems listed above: Chest pain:*** Hypertension:*** Stage III chronic kidney disease: Continue losartan. Elevated BMI: Patient should follow-up with PCP about GLP-1 receptor agonist therapy or bariatric surgery. Dyspnea: We will obtain echocardiogram to evaluate further.  Last echocardiogram was in 2021.***      {Are you ordering a CV Procedure (e.g. stress test, cath, DCCV, TEE, etc)?   Press F2        :[id number]}   Dispo:  No follow-ups on file.      Medication Adjustments/Labs and Tests Ordered: Current medicines are reviewed at length with the patient today.  Concerns regarding medicines are outlined above.  The following changes have been made:  {PLAN; NO CHANGE:13088:s}   Labs/tests ordered: No orders of the defined types were placed in this encounter.   Medication Changes: No orders of the defined types were placed in this encounter.   Current medicines are reviewed at length with the patient today.  The patient {ACTIONS; HAS/DOES NOT HAVE:19233} concerns regarding medicines.  I spent *** minutes reviewing all clinical data during and prior to this visit including all relevant imaging studies, laboratories, clinical information from other health systems, and prior notes from both Cardiology and other specialties, interviewing the patient, and conducting a complete physical examination in order to formulate a comprehensive and personalized evaluation and treatment plan.  History  of Present Illness:      FOCUSED PROBLEM LIST:   Fibromyalgia Hypertension Hyperlipidemia Coronary CTA 2023 No CAD calcium score 0 BMI 50 Stage III chronic kidney disease Mitral regurgitation Mild to moderate TTE 2021  November 2024: The patient is seen for routine cardiology follow-up.  The patient was last seen by Dr. Shari Prows in October 2023.  She has a long history of chest pain however her coronary CTA demonstrated no obstructive coronary artery disease.  She was started on Imdur for possible vasospasm.  She also reported shortness of breath which was thought to be due to asthma.  She was seen in the emergency department in July due to chest pain rating down her left arm lasting several hours.  Her workup there was negative.  She was discharged home.          Current Medications: No outpatient medications have been marked as taking for the 03/14/23 encounter (Appointment) with Orbie Pyo, MD.     Review of Systems:   Please see the history of present illness.    All other systems reviewed and are negative.     EKGs/Labs/Other Test Reviewed:   EKG: EKG from July 2024 demonstrates sinus rhythm  EKG Interpretation Date/Time:    Ventricular Rate:    PR Interval:    QRS Duration:    QT Interval:    QTC Calculation:   R Axis:      Text Interpretation:           Risk Assessment/Calculations:   {Does this patient have ATRIAL FIBRILLATION?:[phone]}      Physical Exam:  VS:  There were no vitals taken for this visit.   No BP recorded.  {Refresh Note OR Click here to enter BP  :1}***   Wt Readings from Last 3 Encounters:  11/14/22 264 lb 8.8 oz (120 kg)  11/14/22 265 lb (120.2 kg)  04/28/22 252 lb (114.3 kg)      GENERAL:  No apparent distress, AOx3 HEENT:  No carotid bruits, +2 carotid impulses, no scleral icterus CAR: RRR Irregular RR*** no murmurs***, gallops, rubs, or thrills RES:  Clear to auscultation bilaterally ABD:  Soft, nontender,  nondistended, positive bowel sounds x 4 VASC:  +2 radial pulses, +2 carotid pulses NEURO:  CN 2-12 grossly intact; motor and sensory grossly intact PSYCH:  No active depression or anxiety EXT:  No edema, ecchymosis, or cyanosis  Signed, Orbie Pyo, MD  03/13/2023 8:08 AM    [facility] [address] [place], [place], [place]  [address] Phone: [phone]; Fax: [phone]   Note:  This document was prepared using Dragon voice recognition software and may include unintentional dictation errors.

## 2023-03-14 ENCOUNTER — Ambulatory Visit: Payer: Medicaid Other | Attending: Internal Medicine | Admitting: Internal Medicine

## 2023-03-14 DIAGNOSIS — R072 Precordial pain: Secondary | ICD-10-CM

## 2023-03-14 DIAGNOSIS — N1831 Chronic kidney disease, stage 3a: Secondary | ICD-10-CM

## 2023-03-14 DIAGNOSIS — I10 Essential (primary) hypertension: Secondary | ICD-10-CM

## 2023-03-14 DIAGNOSIS — R0609 Other forms of dyspnea: Secondary | ICD-10-CM

## 2023-03-14 DIAGNOSIS — Z6841 Body Mass Index (BMI) 40.0 and over, adult: Secondary | ICD-10-CM

## 2023-03-15 ENCOUNTER — Encounter: Payer: Self-pay | Admitting: Internal Medicine

## 2023-05-13 ENCOUNTER — Other Ambulatory Visit: Payer: Self-pay | Admitting: Internal Medicine

## 2023-05-16 NOTE — Telephone Encounter (Signed)
Pt has no showed for new cardiologist and has had numerous attempts. Pt was supposed to see Dr Lynnette Caffey, pt no showed. Would Dr Lynnette Caffey like to refill this medication? Please address

## 2023-08-07 ENCOUNTER — Encounter (HOSPITAL_BASED_OUTPATIENT_CLINIC_OR_DEPARTMENT_OTHER): Payer: Self-pay

## 2023-08-07 ENCOUNTER — Emergency Department (HOSPITAL_BASED_OUTPATIENT_CLINIC_OR_DEPARTMENT_OTHER)

## 2023-08-07 ENCOUNTER — Other Ambulatory Visit: Payer: Self-pay

## 2023-08-07 ENCOUNTER — Emergency Department (HOSPITAL_BASED_OUTPATIENT_CLINIC_OR_DEPARTMENT_OTHER)
Admission: EM | Admit: 2023-08-07 | Discharge: 2023-08-07 | Discharge status: Against Medical Advice | Attending: Emergency Medicine | Admitting: Emergency Medicine

## 2023-08-07 DIAGNOSIS — Z5321 Procedure and treatment not carried out due to patient leaving prior to being seen by health care provider: Secondary | ICD-10-CM | POA: Insufficient documentation

## 2023-08-07 DIAGNOSIS — W228XXA Striking against or struck by other objects, initial encounter: Secondary | ICD-10-CM | POA: Insufficient documentation

## 2023-08-07 DIAGNOSIS — S99921A Unspecified injury of right foot, initial encounter: Secondary | ICD-10-CM | POA: Insufficient documentation

## 2023-08-07 NOTE — ED Triage Notes (Signed)
 Patient presents with swelling of the greater toe of the right foot. Patient states a metal device hit her toe 2 days ago. Toes is swollen and painful to touch and walk on.

## 2023-09-01 NOTE — Progress Notes (Deleted)
 Cardiology Office Note    Patient Name: Felicia Molina Date of Encounter: 09/01/2023  Primary Care Provider:  Mortimer Area, CNM Primary Cardiologist:  Sonny Dust, MD (Inactive) Primary Electrophysiologist: None   Past Medical History    Past Medical History:  Diagnosis Date   Anemia    Asthma    Atypical chest pain 07/28/2018   Back pain, chronic    Fibromyalgia    Hypertension    Mitral regurgitation    Morbid obesity (HCC)    Neurogenic bladder    Pain management contract signed    Palpitations 07/28/2018   Panic attacks    Prolonged Q-T interval on ECG    Reflux    Rheumatoid arthritis (HCC)     History of Present Illness  Felicia Molina is a 53 y.o. female with a PMH of HTN, HLD, asthma, fibromyalgia, obesity who presents today for follow-up.  Ms. Tkac was seen initially by Dr. Ardell Beauvais in 2021 for complaint of chest pain.  She was evaluated in the ED at [facility] and endorsed left-sided sharp chest pain with EKG showing no ischemic changes.  She had improvement in pain without intervention.  Presented to the ED at [facility] on 01/09/2020 with ongoing left-sided and central chest pain.  She was seen in follow-up and TTE showed EF of 55 to 60% with normal RV and mild to moderate MR.  Coronary CTA was completed on 10/21 with no significant disease and calcium  score of 0.  She was seen in follow-up on 12/14/2021 with complaint of dyspnea on exertion.  She was advised to take an additional half tablet of Lasix  for lower extremity edema and shortness of breath.  She also endorsed ongoing chest pain and Imdur  was increased to 30 mg daily.  TTE with EF 55-60%, normal RV, mild-to-moderate MR. Coronary CTA 01/2020 with no significant disease. Ca score 0. She was last seen by Dr. Ardell Beauvais on 02/22/2022.  She reported developing mild shortness of breath and some chest discomfort with walking short distances.   Patient denies chest pain, palpitations, dyspnea, PND,  orthopnea, nausea, vomiting, dizziness, syncope, edema, weight gain, or early satiety.   Discussed the use of AI scribe software for clinical note transcription with the patient, who gave verbal consent to proceed.  History of Present Illness    ***Notes:   Review of Systems  Please see the history of present illness.    All other systems reviewed and are otherwise negative except as noted above.  Physical Exam     Wt Readings from Last 3 Encounters:  08/07/23 235 lb (106.6 kg)  11/14/22 264 lb 8.8 oz (120 kg)  11/14/22 265 lb (120.2 kg)   ZO:[id number] were no vitals filed for this visit.,There is no height or weight on file to calculate BMI. GEN: Well nourished, well developed in no acute distress Neck: No JVD; No carotid bruits Pulmonary: Clear to auscultation without rales, wheezing or rhonchi  Cardiovascular: Normal rate. Regular rhythm. Normal S1. Normal S2.   Murmurs: There is no murmur.  ABDOMEN: Soft, non-tender, non-distended EXTREMITIES:  No edema; No deformity   EKG/LABS/ Recent Cardiac Studies   ECG personally reviewed by me today - ***  Risk Assessment/Calculations:   {Does this patient have ATRIAL FIBRILLATION?:[id number]}      Lab Results  Component Value Date   WBC 9.4 11/14/2022   HGB 12.1 11/14/2022   HCT 37.1 11/14/2022   MCV 91.4 11/14/2022   PLT 365 11/14/2022   Lab Results  Component Value Date   CREATININE 1.26 (H) 11/14/2022   BUN 14 11/14/2022   NA 137 11/14/2022   K 3.8 11/14/2022   CL 100 11/14/2022   CO2 26 11/14/2022   No results found for: "CHOL", "HDL", "LDLCALC", "LDLDIRECT", "TRIG", "CHOLHDL"  Lab Results  Component Value Date   HGBA1C 5.5 11/17/2021   Assessment & Plan    Assessment and Plan Assessment & Plan    1.  Essential hypertension  2.  Hyperlipidemia  3.  Morbid obesity  4.  History of asthma      Disposition: Follow-up with Sonny Dust, MD (Inactive) or APP in *** months {Are you ordering a  CV Procedure (e.g. stress test, cath, DCCV, TEE, etc)?   Press F2        :[id number]}   Signed, Francene Ing, Retha Cast, NP 09/01/2023, 7:18 PM [facility]

## 2023-09-02 ENCOUNTER — Ambulatory Visit: Attending: Nurse Practitioner | Admitting: Nurse Practitioner

## 2023-09-02 DIAGNOSIS — E7800 Pure hypercholesterolemia, unspecified: Secondary | ICD-10-CM

## 2023-09-02 DIAGNOSIS — R079 Chest pain, unspecified: Secondary | ICD-10-CM

## 2023-09-02 DIAGNOSIS — I10 Essential (primary) hypertension: Secondary | ICD-10-CM

## 2023-09-05 ENCOUNTER — Encounter: Payer: Self-pay | Admitting: Nurse Practitioner

## 2023-11-11 ENCOUNTER — Ambulatory Visit: Payer: Self-pay
